# Patient Record
Sex: Female | Born: 1935 | Race: White | Hispanic: No | Smoking: Never smoker
Health system: Southern US, Community
[De-identification: ages and names within clinical notes are randomized; demographics above are authoritative.]

## PROBLEM LIST (undated history)

## (undated) DIAGNOSIS — G4700 Insomnia, unspecified: Secondary | ICD-10-CM

## (undated) DIAGNOSIS — Z960 Presence of urogenital implants: Secondary | ICD-10-CM

## (undated) DIAGNOSIS — G309 Alzheimer's disease, unspecified: Secondary | ICD-10-CM

## (undated) DIAGNOSIS — G20A1 Parkinson's disease without dyskinesia, without mention of fluctuations: Secondary | ICD-10-CM

## (undated) DIAGNOSIS — Z22322 Carrier or suspected carrier of Methicillin resistant Staphylococcus aureus: Secondary | ICD-10-CM

## (undated) DIAGNOSIS — G20 Parkinson's disease: Secondary | ICD-10-CM

## (undated) DIAGNOSIS — F32A Depression, unspecified: Secondary | ICD-10-CM

## (undated) DIAGNOSIS — Z978 Presence of other specified devices: Secondary | ICD-10-CM

## (undated) DIAGNOSIS — F329 Major depressive disorder, single episode, unspecified: Secondary | ICD-10-CM

## (undated) DIAGNOSIS — N390 Urinary tract infection, site not specified: Secondary | ICD-10-CM

## (undated) DIAGNOSIS — F0280 Dementia in other diseases classified elsewhere without behavioral disturbance: Secondary | ICD-10-CM

## (undated) DIAGNOSIS — E079 Disorder of thyroid, unspecified: Secondary | ICD-10-CM

## (undated) DIAGNOSIS — F1010 Alcohol abuse, uncomplicated: Secondary | ICD-10-CM

## (undated) DIAGNOSIS — M1990 Unspecified osteoarthritis, unspecified site: Secondary | ICD-10-CM

## (undated) DIAGNOSIS — G459 Transient cerebral ischemic attack, unspecified: Secondary | ICD-10-CM

## (undated) DIAGNOSIS — I639 Cerebral infarction, unspecified: Secondary | ICD-10-CM

## (undated) HISTORY — PX: ABDOMINAL HYSTERECTOMY: SHX81

## (undated) HISTORY — PX: THYROID SURGERY: SHX805

## (undated) HISTORY — DX: Disorder of thyroid, unspecified: E07.9

## (undated) HISTORY — DX: Alcohol abuse, uncomplicated: F10.10

## (undated) HISTORY — DX: Major depressive disorder, single episode, unspecified: F32.9

## (undated) HISTORY — DX: Depression, unspecified: F32.A

## (undated) HISTORY — DX: Unspecified osteoarthritis, unspecified site: M19.90

## (undated) HISTORY — DX: Cerebral infarction, unspecified: I63.9

## (undated) HISTORY — PX: TONSILLECTOMY AND ADENOIDECTOMY: SUR1326

---

## 2003-11-23 ENCOUNTER — Inpatient Hospital Stay (HOSPITAL_COMMUNITY): Admission: EM | Admit: 2003-11-23 | Discharge: 2003-11-28 | Payer: Self-pay

## 2003-11-26 ENCOUNTER — Encounter (INDEPENDENT_AMBULATORY_CARE_PROVIDER_SITE_OTHER): Payer: Self-pay | Admitting: Specialist

## 2003-11-30 ENCOUNTER — Encounter: Admission: RE | Admit: 2003-11-30 | Discharge: 2003-11-30 | Payer: Self-pay | Admitting: Family Medicine

## 2004-01-18 ENCOUNTER — Encounter: Admission: RE | Admit: 2004-01-18 | Discharge: 2004-01-18 | Payer: Self-pay | Admitting: Family Medicine

## 2006-11-19 HISTORY — PX: CHOLECYSTECTOMY: SHX55

## 2009-08-19 ENCOUNTER — Inpatient Hospital Stay (HOSPITAL_COMMUNITY): Admission: EM | Admit: 2009-08-19 | Discharge: 2009-08-20 | Payer: Self-pay | Admitting: Emergency Medicine

## 2009-08-21 ENCOUNTER — Encounter (INDEPENDENT_AMBULATORY_CARE_PROVIDER_SITE_OTHER): Payer: Self-pay | Admitting: Internal Medicine

## 2010-02-25 LAB — HM PAP SMEAR

## 2010-02-25 LAB — HM MAMMOGRAPHY

## 2011-02-14 ENCOUNTER — Emergency Department (HOSPITAL_COMMUNITY): Payer: No Typology Code available for payment source

## 2011-02-14 ENCOUNTER — Observation Stay (HOSPITAL_COMMUNITY)
Admission: EM | Admit: 2011-02-14 | Discharge: 2011-02-15 | Disposition: A | Discharge status: Home / Self Care | Payer: No Typology Code available for payment source | Attending: Family Medicine | Admitting: Family Medicine

## 2011-02-14 DIAGNOSIS — N390 Urinary tract infection, site not specified: Secondary | ICD-10-CM | POA: Insufficient documentation

## 2011-02-14 DIAGNOSIS — F329 Major depressive disorder, single episode, unspecified: Secondary | ICD-10-CM | POA: Insufficient documentation

## 2011-02-14 DIAGNOSIS — G459 Transient cerebral ischemic attack, unspecified: Principal | ICD-10-CM | POA: Insufficient documentation

## 2011-02-14 DIAGNOSIS — F1010 Alcohol abuse, uncomplicated: Secondary | ICD-10-CM | POA: Insufficient documentation

## 2011-02-14 DIAGNOSIS — F3289 Other specified depressive episodes: Secondary | ICD-10-CM | POA: Insufficient documentation

## 2011-02-14 DIAGNOSIS — E039 Hypothyroidism, unspecified: Secondary | ICD-10-CM | POA: Insufficient documentation

## 2011-02-14 DIAGNOSIS — E785 Hyperlipidemia, unspecified: Secondary | ICD-10-CM | POA: Insufficient documentation

## 2011-02-14 DIAGNOSIS — Z9181 History of falling: Secondary | ICD-10-CM | POA: Insufficient documentation

## 2011-02-14 DIAGNOSIS — I10 Essential (primary) hypertension: Secondary | ICD-10-CM | POA: Insufficient documentation

## 2011-02-14 LAB — CK TOTAL AND CKMB (NOT AT ARMC)
CK, MB: 3 ng/mL (ref 0.3–4.0)
CK, MB: 2.6 ng/mL (ref 0.3–4.0)
Relative Index: 2.9 — ABNORMAL HIGH (ref 0.0–2.5)
Relative Index: 2.5 (ref 0.0–2.5)
Total CK: 105 U/L (ref 7–177)

## 2011-02-14 LAB — COMPREHENSIVE METABOLIC PANEL
Albumin: 3.5 g/dL (ref 3.5–5.2)
Alkaline Phosphatase: 79 U/L (ref 39–117)
BUN: 14 mg/dL (ref 6–23)
Calcium: 9.3 mg/dL (ref 8.4–10.5)
Creatinine, Ser: 0.91 mg/dL (ref 0.4–1.2)
Potassium: 3.9 mEq/L (ref 3.5–5.1)
Total Protein: 6.6 g/dL (ref 6.0–8.3)

## 2011-02-14 LAB — RAPID URINE DRUG SCREEN, HOSP PERFORMED
Barbiturates: NOT DETECTED
Cocaine: NOT DETECTED
Opiates: NOT DETECTED
Tetrahydrocannabinol: NOT DETECTED

## 2011-02-14 LAB — DIFFERENTIAL
Basophils Relative: 1 % (ref 0–1)
Lymphs Abs: 2.1 10*3/uL (ref 0.7–4.0)
Monocytes Relative: 10 % (ref 3–12)
Neutro Abs: 4.5 10*3/uL (ref 1.7–7.7)
Neutrophils Relative %: 59 % (ref 43–77)

## 2011-02-14 LAB — URINALYSIS, ROUTINE W REFLEX MICROSCOPIC
Bilirubin Urine: NEGATIVE
Ketones, ur: NEGATIVE mg/dL
Nitrite: NEGATIVE
pH: 7 (ref 5.0–8.0)

## 2011-02-14 LAB — POCT I-STAT, CHEM 8
Creatinine, Ser: 1.1 mg/dL (ref 0.4–1.2)
Hemoglobin: 13.6 g/dL (ref 12.0–15.0)
Potassium: 3.9 mEq/L (ref 3.5–5.1)
Sodium: 137 mEq/L (ref 135–145)

## 2011-02-14 LAB — ETHANOL: Alcohol, Ethyl (B): <5 mg/dL (ref 0–10)

## 2011-02-14 LAB — APTT: aPTT: 27 seconds (ref 24–37)

## 2011-02-14 LAB — TROPONIN I: Troponin I: 0.01 ng/mL (ref 0.00–0.06)

## 2011-02-14 LAB — URINE MICROSCOPIC-ADD ON

## 2011-02-14 LAB — CBC
Hemoglobin: 13.4 g/dL (ref 12.0–15.0)
MCH: 32.1 pg (ref 26.0–34.0)
MCV: 94 fL (ref 78.0–100.0)
RBC: 4.18 MIL/uL (ref 3.87–5.11)

## 2011-02-14 LAB — PROTIME-INR: Prothrombin Time: 13.3 seconds (ref 11.6–15.2)

## 2011-02-14 MED ORDER — IOHEXOL 350 MG/ML SOLN: 50.0000 mL | Freq: Once | INTRAVENOUS | Status: AC | PRN

## 2011-02-15 ENCOUNTER — Observation Stay (HOSPITAL_COMMUNITY): Payer: No Typology Code available for payment source

## 2011-02-15 ENCOUNTER — Observation Stay (HOSPITAL_COMMUNITY): Payer: Medicare (Managed Care)

## 2011-02-15 ENCOUNTER — Other Ambulatory Visit: Payer: Self-pay | Admitting: Family Medicine

## 2011-02-15 DIAGNOSIS — F1010 Alcohol abuse, uncomplicated: Secondary | ICD-10-CM

## 2011-02-15 DIAGNOSIS — F0390 Unspecified dementia without behavioral disturbance: Secondary | ICD-10-CM

## 2011-02-15 DIAGNOSIS — G459 Transient cerebral ischemic attack, unspecified: Secondary | ICD-10-CM

## 2011-02-15 LAB — BASIC METABOLIC PANEL
BUN: 13 mg/dL (ref 6–23)
CO2: 28 mEq/L (ref 19–32)
Calcium: 9 mg/dL (ref 8.4–10.5)
Chloride: 100 mEq/L (ref 96–112)
Creatinine, Ser: 0.67 mg/dL (ref 0.4–1.2)
GFR calc Af Amer: >60 mL/min (ref >60)
GFR calc non Af Amer: >60 mL/min (ref >60)
Glucose, Bld: 109 mg/dL — ABNORMAL HIGH (ref 70–99)
Potassium: 3.7 mEq/L (ref 3.5–5.1)
Sodium: 138 mEq/L (ref 135–145)

## 2011-02-15 LAB — CARDIAC PANEL(CRET KIN+CKTOT+MB+TROPI)
CK, MB: 1.6 ng/mL (ref 0.3–4.0)
CK, MB: 1.6 ng/mL (ref 0.3–4.0)
Relative Index: INVALID (ref 0.0–2.5)
Relative Index: INVALID (ref 0.0–2.5)
Total CK: 68 U/L (ref 7–177)
Troponin I: 0.02 ng/mL (ref 0.00–0.06)
Troponin I: 0.01 ng/mL (ref 0.00–0.06)

## 2011-02-15 LAB — CBC
HCT: 37.5 % (ref 36.0–46.0)
MCH: 32.2 pg (ref 26.0–34.0)
MCHC: 34.4 g/dL (ref 30.0–36.0)
MCV: 93.5 fL (ref 78.0–100.0)
RDW: 12.3 % (ref 11.5–15.5)

## 2011-02-15 LAB — TSH: TSH: 1.939 u[IU]/mL (ref 0.350–4.500)

## 2011-02-16 LAB — URINE CULTURE
Colony Count: >=100000
Culture  Setup Time: 201203282310

## 2011-02-17 ENCOUNTER — Emergency Department (HOSPITAL_COMMUNITY): Payer: Medicare (Managed Care)

## 2011-02-17 ENCOUNTER — Observation Stay (HOSPITAL_COMMUNITY)
Admission: EM | Admit: 2011-02-17 | Discharge: 2011-02-18 | Disposition: A | Discharge status: Home / Self Care | Payer: No Typology Code available for payment source | Attending: Family Medicine | Admitting: Family Medicine

## 2011-02-17 DIAGNOSIS — R4182 Altered mental status, unspecified: Secondary | ICD-10-CM

## 2011-02-17 DIAGNOSIS — I10 Essential (primary) hypertension: Secondary | ICD-10-CM | POA: Insufficient documentation

## 2011-02-17 DIAGNOSIS — E039 Hypothyroidism, unspecified: Secondary | ICD-10-CM | POA: Insufficient documentation

## 2011-02-17 DIAGNOSIS — R0602 Shortness of breath: Secondary | ICD-10-CM | POA: Insufficient documentation

## 2011-02-17 DIAGNOSIS — F3289 Other specified depressive episodes: Secondary | ICD-10-CM | POA: Insufficient documentation

## 2011-02-17 DIAGNOSIS — F1010 Alcohol abuse, uncomplicated: Secondary | ICD-10-CM | POA: Insufficient documentation

## 2011-02-17 DIAGNOSIS — G459 Transient cerebral ischemic attack, unspecified: Principal | ICD-10-CM | POA: Insufficient documentation

## 2011-02-17 DIAGNOSIS — E785 Hyperlipidemia, unspecified: Secondary | ICD-10-CM | POA: Insufficient documentation

## 2011-02-17 DIAGNOSIS — R51 Headache: Secondary | ICD-10-CM | POA: Insufficient documentation

## 2011-02-17 DIAGNOSIS — F329 Major depressive disorder, single episode, unspecified: Secondary | ICD-10-CM | POA: Insufficient documentation

## 2011-02-17 DIAGNOSIS — R5381 Other malaise: Secondary | ICD-10-CM | POA: Insufficient documentation

## 2011-02-17 DIAGNOSIS — I517 Cardiomegaly: Secondary | ICD-10-CM | POA: Insufficient documentation

## 2011-02-17 DIAGNOSIS — N390 Urinary tract infection, site not specified: Secondary | ICD-10-CM | POA: Insufficient documentation

## 2011-02-17 LAB — URINALYSIS, ROUTINE W REFLEX MICROSCOPIC
Bilirubin Urine: NEGATIVE
Glucose, UA: NEGATIVE mg/dL
Specific Gravity, Urine: 1.012 (ref 1.005–1.030)
Urobilinogen, UA: 0.2 mg/dL (ref 0.0–1.0)
pH: 6 (ref 5.0–8.0)

## 2011-02-17 LAB — DIFFERENTIAL
Basophils Absolute: 0 10*3/uL (ref 0.0–0.1)
Basophils Relative: 1 % (ref 0–1)
Eosinophils Absolute: 0.3 10*3/uL (ref 0.0–0.7)
Eosinophils Relative: 3 % (ref 0–5)
Eosinophils Relative: 4 % (ref 0–5)
Lymphocytes Relative: 20 % (ref 12–46)
Lymphs Abs: 1.8 10*3/uL (ref 0.7–4.0)
Monocytes Absolute: 0.8 10*3/uL (ref 0.1–1.0)
Monocytes Relative: 8 % (ref 3–12)
Neutro Abs: 4.8 10*3/uL (ref 1.7–7.7)
Neutrophils Relative %: 69 % (ref 43–77)

## 2011-02-17 LAB — COMPREHENSIVE METABOLIC PANEL
ALT: 17 U/L (ref 0–35)
AST: 22 U/L (ref 0–37)
Albumin: 3.5 g/dL (ref 3.5–5.2)
BUN: 11 mg/dL (ref 6–23)
CO2: 26 mEq/L (ref 19–32)
CO2: 27 mEq/L (ref 19–32)
Calcium: 8.4 mg/dL (ref 8.4–10.5)
Calcium: 8.6 mg/dL (ref 8.4–10.5)
Creatinine, Ser: 0.8 mg/dL (ref 0.4–1.2)
Creatinine, Ser: 0.63 mg/dL (ref 0.4–1.2)
GFR calc Af Amer: >60 mL/min (ref >60)
GFR calc non Af Amer: >60 mL/min (ref >60)
Glucose, Bld: 98 mg/dL (ref 70–99)
Sodium: 134 mEq/L — ABNORMAL LOW (ref 135–145)
Total Protein: 6.4 g/dL (ref 6.0–8.3)
Total Protein: 6.3 g/dL (ref 6.0–8.3)

## 2011-02-17 LAB — CBC
HCT: 37.6 % (ref 36.0–46.0)
MCH: 32.8 pg (ref 26.0–34.0)
MCHC: 34.8 g/dL (ref 30.0–36.0)
MCV: 94.2 fL (ref 78.0–100.0)
Platelets: 246 10*3/uL (ref 150–400)
RBC: 3.99 MIL/uL (ref 3.87–5.11)
RDW: 12.2 % (ref 11.5–15.5)
WBC: 7.9 10*3/uL (ref 4.0–10.5)

## 2011-02-17 LAB — PROTIME-INR
INR: 1.01 (ref 0.00–1.49)
Prothrombin Time: 13.5 seconds (ref 11.6–15.2)

## 2011-02-17 LAB — CK TOTAL AND CKMB (NOT AT ARMC)
CK, MB: 1.6 ng/mL (ref 0.3–4.0)
Relative Index: INVALID (ref 0.0–2.5)
Total CK: 53 U/L (ref 7–177)

## 2011-02-17 LAB — URINE MICROSCOPIC-ADD ON

## 2011-02-17 LAB — ETHANOL: Alcohol, Ethyl (B): <5 mg/dL (ref 0–10)

## 2011-02-17 LAB — APTT
aPTT: 27 seconds (ref 24–37)
aPTT: 29 seconds (ref 24–37)

## 2011-02-18 LAB — COMPREHENSIVE METABOLIC PANEL
ALT: 15 U/L (ref 0–35)
AST: 18 U/L (ref 0–37)
Albumin: 3.2 g/dL — ABNORMAL LOW (ref 3.5–5.2)
Calcium: 8.6 mg/dL (ref 8.4–10.5)
GFR calc Af Amer: >60 mL/min (ref >60)
Glucose, Bld: 101 mg/dL — ABNORMAL HIGH (ref 70–99)
Sodium: 140 mEq/L (ref 135–145)
Total Protein: 5.9 g/dL — ABNORMAL LOW (ref 6.0–8.3)

## 2011-02-18 LAB — AMMONIA: Ammonia: 26 umol/L (ref 11–35)

## 2011-02-18 LAB — URINE CULTURE: Culture  Setup Time: 201204010013

## 2011-02-18 LAB — ETHANOL: Alcohol, Ethyl (B): <5 mg/dL (ref 0–10)

## 2011-02-19 ENCOUNTER — Ambulatory Visit: Payer: Self-pay | Admitting: Internal Medicine

## 2011-02-19 ENCOUNTER — Telehealth: Payer: Self-pay | Admitting: *Deleted

## 2011-02-19 NOTE — Telephone Encounter (Signed)
OK to give verbal order for these items.

## 2011-02-19 NOTE — Telephone Encounter (Signed)
[facility] Annabelle Harman [phone], ext [phone] called re: Suzie Portela.  Dana requesting verbal order for  1.) Straight cane 2.) transfer bench 3.) 3 in one commode

## 2011-02-20 NOTE — Telephone Encounter (Signed)
Verbal order for 3 requested items on Dana's personally identified VM at [facility]

## 2011-02-22 LAB — URINALYSIS, ROUTINE W REFLEX MICROSCOPIC
Bilirubin Urine: NEGATIVE
Protein, ur: NEGATIVE mg/dL
Urobilinogen, UA: 0.2 mg/dL (ref 0.0–1.0)

## 2011-02-22 LAB — CBC
HCT: 40.6 % (ref 36.0–46.0)
Hemoglobin: 15 g/dL (ref 12.0–15.0)
MCV: 101 fL — ABNORMAL HIGH (ref 78.0–100.0)
Platelets: 221 10*3/uL (ref 150–400)
RBC: 4.02 MIL/uL (ref 3.87–5.11)
RBC: 4.32 MIL/uL (ref 3.87–5.11)
WBC: 9 10*3/uL (ref 4.0–10.5)

## 2011-02-22 LAB — COMPREHENSIVE METABOLIC PANEL
ALT: 16 U/L (ref 0–35)
AST: 21 U/L (ref 0–37)
Albumin: 3.2 g/dL — ABNORMAL LOW (ref 3.5–5.2)
CO2: 26 mEq/L (ref 19–32)
Calcium: 8.3 mg/dL — ABNORMAL LOW (ref 8.4–10.5)
Creatinine, Ser: 0.59 mg/dL (ref 0.4–1.2)
GFR calc Af Amer: >60 mL/min (ref >60)
GFR calc non Af Amer: >60 mL/min (ref >60)
Sodium: 144 mEq/L (ref 135–145)

## 2011-02-22 LAB — CK TOTAL AND CKMB (NOT AT ARMC)
CK, MB: 2 ng/mL (ref 0.3–4.0)
Relative Index: INVALID (ref 0.0–2.5)
Relative Index: INVALID (ref 0.0–2.5)

## 2011-02-22 LAB — RETICULOCYTES
RBC.: 4.26 MIL/uL (ref 3.87–5.11)
Retic Ct Pct: 2.1 % (ref 0.4–3.1)

## 2011-02-22 LAB — FOLATE: Folate: 18.5 ng/mL

## 2011-02-22 LAB — CULTURE, BLOOD (ROUTINE X 2): Culture: NO GROWTH

## 2011-02-22 LAB — POCT CARDIAC MARKERS: Myoglobin, poc: 96.6 ng/mL (ref 12–200)

## 2011-02-22 LAB — BASIC METABOLIC PANEL
BUN: 9 mg/dL (ref 6–23)
CO2: 25 mEq/L (ref 19–32)
Calcium: 8.7 mg/dL (ref 8.4–10.5)
Chloride: 105 mEq/L (ref 96–112)
Creatinine, Ser: 0.61 mg/dL (ref 0.4–1.2)
GFR calc Af Amer: >60 mL/min (ref >60)
GFR calc non Af Amer: >60 mL/min (ref >60)
Glucose, Bld: 98 mg/dL (ref 70–99)
Potassium: 3.5 mEq/L (ref 3.5–5.1)
Sodium: 139 mEq/L (ref 135–145)

## 2011-02-22 LAB — LIPID PANEL
HDL: 58 mg/dL (ref >39)
Total CHOL/HDL Ratio: 3.1 RATIO
Triglycerides: 101 mg/dL (ref <150)

## 2011-02-22 LAB — RAPID URINE DRUG SCREEN, HOSP PERFORMED
Amphetamines: NOT DETECTED
Barbiturates: NOT DETECTED
Benzodiazepines: NOT DETECTED

## 2011-02-22 LAB — CARDIAC PANEL(CRET KIN+CKTOT+MB+TROPI)
CK, MB: 1.2 ng/mL (ref 0.3–4.0)
Troponin I: 0.02 ng/mL (ref 0.00–0.06)
Troponin I: 0.02 ng/mL (ref 0.00–0.06)

## 2011-02-22 LAB — TROPONIN I: Troponin I: 0.01 ng/mL (ref 0.00–0.06)

## 2011-02-22 LAB — URINE MICROSCOPIC-ADD ON

## 2011-02-22 LAB — DIFFERENTIAL
Basophils Absolute: 0 10*3/uL (ref 0.0–0.1)
Lymphocytes Relative: 30 % (ref 12–46)
Monocytes Absolute: 0.6 10*3/uL (ref 0.1–1.0)
Monocytes Relative: 12 % (ref 3–12)
Neutro Abs: 2.8 10*3/uL (ref 1.7–7.7)

## 2011-02-22 LAB — GLUCOSE, CAPILLARY: Glucose-Capillary: 116 mg/dL — ABNORMAL HIGH (ref 70–99)

## 2011-02-22 LAB — TSH: TSH: 0.292 u[IU]/mL — ABNORMAL LOW (ref 0.350–4.500)

## 2011-02-22 LAB — IRON AND TIBC
Saturation Ratios: 10 % — ABNORMAL LOW (ref 20–55)
UIBC: 283 ug/dL

## 2011-02-22 LAB — FERRITIN: Ferritin: 72 ng/mL (ref 10–291)

## 2011-02-22 LAB — VITAMIN B12: Vitamin B-12: 796 pg/mL (ref 211–911)

## 2011-02-26 ENCOUNTER — Ambulatory Visit (INDEPENDENT_AMBULATORY_CARE_PROVIDER_SITE_OTHER): Payer: No Typology Code available for payment source | Admitting: Family Medicine

## 2011-02-26 ENCOUNTER — Encounter: Payer: Self-pay | Admitting: Family Medicine

## 2011-02-26 VITALS — BP 142/70 | HR 80 | Temp 98.5°F | Resp 12 | Ht 60.0 in | Wt 130.0 lb

## 2011-02-26 DIAGNOSIS — N390 Urinary tract infection, site not specified: Secondary | ICD-10-CM

## 2011-02-26 DIAGNOSIS — E039 Hypothyroidism, unspecified: Secondary | ICD-10-CM | POA: Insufficient documentation

## 2011-02-26 DIAGNOSIS — F1010 Alcohol abuse, uncomplicated: Secondary | ICD-10-CM

## 2011-02-26 DIAGNOSIS — G459 Transient cerebral ischemic attack, unspecified: Secondary | ICD-10-CM

## 2011-02-26 DIAGNOSIS — E785 Hyperlipidemia, unspecified: Secondary | ICD-10-CM | POA: Insufficient documentation

## 2011-02-26 DIAGNOSIS — F329 Major depressive disorder, single episode, unspecified: Secondary | ICD-10-CM

## 2011-02-26 NOTE — Patient Instructions (Signed)
.Low-Fat, Low-Saturated-Fat, Low-Cholesterol Diets Food Selection Guide BREADS, CEREALS, PASTA, RICE, DRIED PEAS, AND BEANS These products are high in carbohydrates and most are low in fat. Therefore, they can be increased in the diet as substitutes for fatty foods. They too, however, contain calories and should not be eaten in excess. Cereals can be eaten for snacks as well as for breakfast.  Include foods that contain fiber (fruits, vegetables, whole grains, and legumes). Research shows that fiber may lower blood cholesterol levels, especially the water-soluble fiber found in fruits, vegetables, oat products, and legumes. FRUITS AND VEGETABLES It is good to eat fruits and vegetables. Besides being sources of fiber, both are rich in vitamins and some minerals. They help you get the daily allowances of these nutrients. Fruits and vegetables can be used for snacks and desserts. MEATS Limit lean meat, chicken, turkey, and fish to no more than 6 ounces per day. Beef, Pork, and Lamb  Use lean cuts of beef, pork, and lamb. Lean cuts include:   Extra-lean ground beef.   Arm roast.   Sirloin tip.   Center-cut ham.   Round steak.   Loin chops.   Rump roast.   Tenderloin.  Trim all fat off the outside of meats before cooking. It is not necessary to severely decrease the intake of red meat, but lean choices should be made. Lean meat is rich in protein and contains a highly absorbable form of iron. Premenopausal women, in particular, should avoid reducing lean red meat because this could increase the risk for low red blood cells (iron-deficiency anemia). Processed Meats Processed meats, such as bacon, bologna, salami, sausage, and hot dogs contain large quantities of fat, are not rich in valuable nutrients, and should not be eaten very often. Organ Meats The organ meats, such as liver, sweetbreads, kidneys, and brain are very rich in cholesterol. They should be limited. Chicken and  Turkey These are good sources of protein. The fat of poultry can be reduced by removing the skin and underlying fat layers before cooking. Chicken and turkey can be substituted for lean red meat in the diet. Poultry should not be fried or covered with high-fat sauces. Fish and Shellfish Fish is a good source of protein. Shellfish contain cholesterol, but they usually are low in saturated fatty acids. The preparation of fish is important. Like chicken and turkey, they should not be fried or covered with high-fat sauces. EGGS Egg yolks often are hidden in cooked and processed foods. Egg whites contain no fat or cholesterol. They can be eaten often. Try 1 to 2 egg whites instead of whole eggs in recipes or use egg substitutes that do not contain yolk. MILK AND DAIRY PRODUCTS Use skim or 1% milk instead of 2% or whole milk. Decrease whole milk, natural, and processed cheeses. Use nonfat or low-fat (2%) cottage cheese or low-fat cheeses made from vegetable oils. Choose nonfat or low-fat (1 to 2%) yogurt. Experiment with evaporated skim milk in recipes that call for heavy cream. Substitute low-fat yogurt or low-fat cottage cheese for sour cream in dips and salad dressings. Have at least 2 servings of low-fat dairy products, such as 2 glasses of skim (or 1%) milk each day to help get your daily calcium intake. FATS AND OILS Reduce the total intake of fats, especially saturated fat. Butterfat, lard, and beef fats are high in saturated fat and cholesterol. These should be avoided as much as possible. Vegetable fats do not contain cholesterol, but certain vegetable fats, such as   coconut oil, palm oil, and palm kernel oil are very high in saturated fats. These should be limited. These fats are often used in bakery goods, processed foods, popcorn, oils, and nondairy creamers. Vegetable shortenings and some peanut butters contain hydrogenated oils, which are also saturated fats. Read the labels on these foods and check  for saturated vegetable oils. Unsaturated vegetable oils and fats do not raise blood cholesterol. However, they should be limited because they are fats and are high in calories. Total fat should still be limited to 30% of your daily caloric intake. Desirable liquid vegetable oils are corn oil, cottonseed oil, olive oil, canola oil, safflower oil, soybean oil, and sunflower oil. Peanut oil is not as good, but small amounts are acceptable. Buy a heart-healthy tub margarine that has no partially hydrogenated oils in the ingredients. Mayonnaise and salad dressings often are made from unsaturated fats, but they should also be limited because of their high calorie and fat content. Seeds, nuts, peanut butter, olives, and avocados are high in fat, but the fat is mainly the unsaturated type. These foods should be limited mainly to avoid excess calories and fat. OTHER EATING TIPS Snacks  Most sweets should be limited as snacks. They tend to be rich in calories and fats, and their caloric content outweighs their nutritional value. Some good choices in snacks are graham crackers, melba toast, soda crackers, bagels (no egg), English muffins, fruits, and vegetables. These snacks are preferable to snack crackers, French fries, and chips. Popcorn should be air-popped or cooked in small amounts of liquid vegetable oil. Desserts Eat fruit, low-fat yogurt, and fruit ices instead of pastries, cake, and cookies. Sherbet, angel food cake, gelatin dessert, frozen low-fat yogurt, or other frozen products that do not contain saturated fat (pure fruit juice bars, frozen ice pops) are also acceptable.  COOKING METHODS Choose those methods that use little or no fat. They include:  Poaching.   Braising.   Steaming.   Grilling.   Baking.   Stir-frying.   Broiling.   Microwaving.  Foods can be cooked in a nonstick pan without added fat, or use a nonfat cooking spray in regular cookware. Limit fried foods and avoid frying  in saturated fat. Add moisture to lean meats by using water, broth, cooking wines, and other nonfat or low-fat sauces along with the cooking methods mentioned above. Soups and stews should be chilled after cooking. The fat that forms on top after a few hours in the refrigerator should be skimmed off. When preparing meals, avoid using excess salt. Salt can contribute to raising blood pressure in some people. EATING AWAY FROM HOME Order entres, potatoes, and vegetables without sauces or butter. When meat exceeds the size of a deck of cards (3 to 4 ounces), the rest can be taken home for another meal. Choose vegetable or fruit salads and ask for low-calorie salad dressings to be served on the side. Use dressings sparingly. Limit high-fat toppings, such as bacon, crumbled eggs, cheese, sunflower seeds, and olives. Ask for heart-healthy tub margarine instead of butter. Document Released: 04/27/2002 Document Re-Released: 01/30/2010 [employer] Patient Information 2011 [employer]. 

## 2011-02-26 NOTE — Progress Notes (Signed)
Subjective:    Patient ID: [name], female    DOB: 11-Mar-1931, 77 y.o.   MRN: [id number]  HPI Pleasant 77 year old female seen to establish care. Past medical history reviewed. She had recent hospitalization for what sounds like TIAs. History is that approximately 2 weeks ago she had episode of some right facial droop and right upper and lower extremity weakness with slurred speech. Symptoms were transient lasting hours. Then a couple days later she had according to daughter very similar symptoms with this time involving left facial weakness again with slurred speech and transient extremity weakness. She was admitted and MRI showed no evidence for acute CVA. Echocardiogram normal LV function.  CT angio neck 54 % LICA calcified plaque. Patient started on Plavix. No recurrent neurologic symptoms since discharge.  Patient had concern for some depressive issues. Widowed since October of this year. Just moved here to stay with daughter. No suicidal ideation. Started sertraline 25 mg daily in hospital. Patient states she feels off balance and wonders if medication is related. Prior history of alcohol abuse but none now several weeks. Currently not using any alcohol.  Reportedly had UTI in the hospital but urine culture revealed mixed colonies. Currently on Keflex which she has taken for over one week now. No current urinary symptoms.  Patient also has history of hypothyroidism and compliant with medication. Recent TSH normal. Also has some osteoarthritis especially involving left knee. She is allergic to sulfa and Celexa.  Family history as recorded elsewhere. Widowed since October of 2011. No history of smoking.   Review of Systems  Constitutional: Positive for fatigue. Negative for fever, chills, activity change, appetite change and unexpected weight change.  HENT: Negative for ear pain, congestion, sore throat, trouble swallowing, neck pain, voice change and tinnitus.   Respiratory: Negative for  cough, shortness of breath and wheezing.   Cardiovascular: Negative for chest pain, palpitations and leg swelling.  Gastrointestinal: Negative for nausea, vomiting, abdominal pain, diarrhea, constipation and blood in stool.  Genitourinary: Negative for dysuria and hematuria.  Musculoskeletal: Positive for back pain and arthralgias.  Skin: Negative for rash.  Neurological: Positive for dizziness. Negative for seizures, syncope and headaches.  Hematological: Negative for adenopathy. Does not bruise/bleed easily.  Psychiatric/Behavioral: Positive for dysphoric mood. Negative for suicidal ideas, hallucinations, confusion, self-injury and agitation.       Objective:   Physical Exam  Constitutional: She is oriented to person, place, and time. She appears well-developed and well-nourished. No distress.  HENT:  Head: Normocephalic and atraumatic.  Right Ear: External ear normal.  Left Ear: External ear normal.  Nose: Nose normal.  Mouth/Throat: Oropharynx is clear and moist. No oropharyngeal exudate.  Eyes: Right eye exhibits no discharge. Left eye exhibits no discharge.  Neck: Neck supple. No thyromegaly present.  Cardiovascular: Normal rate and regular rhythm.  Exam reveals no gallop.   Pulmonary/Chest: Effort normal and breath sounds normal. No respiratory distress. She has no wheezes. She has no rales.  Musculoskeletal: She exhibits no edema.  Lymphadenopathy:    She has no cervical adenopathy.  Neurological: She is alert and oriented to person, place, and time. No cranial nerve deficit.       Gait is normal and unassisted. No focal strength deficits  Skin: No rash noted.  Psychiatric: She has a normal mood and affect. Her behavior is normal.          Assessment & Plan:  #1 cerebrovascular disease with recent TIAs reported. Patient currently stable on Plavix #2 hyperlipidemia  with recent initiation of simvastatin #3 probable adjustment disorder with depressed mood with recent  loss of husband.  On sertraline. #4 hypothyroidism with recent normal TSH #5 recent UTI symptomatically stable  Continue with recent prescribed medications. We'll need to check lipid and hepatic in approximately 4-6 weeks. Discontinue Keflex. Reassess depression and other issues in 2 weeks.  We also discussed preventive measures such as pneumovax and she has been reluctant in the past.

## 2011-03-06 NOTE — Discharge Summary (Signed)
NAMEHEATHERLY, [name]                ACCOUNT NO.:  [id number]  MEDICAL RECORD NO.:  [id number]           PATIENT TYPE:  O  LOCATION:  3028                         FACILITY:  [facility]  PHYSICIAN:  [name], M.D.DATE OF BIRTH:  1931-02-16  DATE OF ADMISSION:  02/14/2011 DATE OF DISCHARGE:  02/15/2011                              DISCHARGE SUMMARY   DISCHARGE DIAGNOSES: 1. Transient ischemic attack. 2. Altered mental status, resolved. 3. Hypertension. 4. Hypothyroidism. 5. History of alcohol abuse. 6. Urinary tract infection. 7. Depression. 8. Hyperlipidemia.  DISCHARGE MEDICATIONS: 1. Plavix 75 mg p.o. daily. 2. Folic acid 1 mg p.o. daily. 3. Multivitamin p.o. daily. 4. Pravastatin 40 mg p.o. daily. 5. Thiamine 100 mg p.o. daily. 6. Lorazepam 0.5 mg p.o. q.4 h p.r.n. 7. Synthroid 137 mcg p.o. daily. 8. Tylenol 650 mg p.o. q.4 h p.r.n. pain.  LABORATORY DATA AND PROCEDURES: 1. Hemoglobin A1c 6.0. 2. Cholesterol 195, triglyceride 87, HDL 60, and LDL 118. 3. TSH 1.93. 4. Urine culture growing greater than 100,000 colonies Klebsiella     pneumoniae. 5. CTA of head and neck showing prominent calcified plaque of proximal     left internal carotid artery with 54% stenosis and mild narrowing     of right carotid, small acute left frontotemporal lobe infarct and     calcification with mild narrowing of vertebral arteries     bilaterally. 6. A 2-D echocardiogram showing ejection fraction of 60-65% with     normal wall motion, grade 1 diastolic dysfunction, and mild aortic     valve and mitral valve regurgitation and no cardiac source of     embolism.  BRIEF HOSPITAL COURSE:  This is a 77 year old female with a history of TIA and alcohol use who presents with transient slurred speech, right facial droop, and altered mental status. 1. TIA.  The patient did have transient word-finding difficulty,     dysarthria, and right facial droop that had resolved within 3 hours  of presentation.  These symptoms were concerning for TIA given her     previous history of TIA several years prior.  CT scan did show a     likely small frontotemporal lesion with uncertain acuity.  Additional stroke workup     included echocardiogram which did not reveal cardiac source and     risk stratification labs including fasting lipid panel, TSH, and     A1c as listed above.  Additionally, her head and neck arteries were     evaluated with CTA and no significant stenoses were discovered.     The patient had previously been taking a daily aspirin and after     consideration of this was started on Plavix.  The patient had no     residual speech, motor, or sensory deficits at the time of     discharge.  She was noted to have a normal gait and no residual     facial paralysis or speech difficulty.  She was completely fluent     with both receptive and expressive speech.  She was started on     statin therapy with her  LDL of 118 as an untreated fasting level. 2. History of alcohol abuse.  The patient does endorse drinking up to     3 beers daily and was monitored on CIWA protocol without any     withdrawal symptoms during hospitalization.  The patient was     strongly encouraged to discontinue this as it will likely     contribute to her decline in health in years to come.  She was started on     multivitamin, folate, and thiamine during her hospitalization and     encouraged to continue this for alcohol-induced nutritional deficits.     Likely, this is also contributing to a component of depression     which the patient endorses. 3. Bacteriuria.  The patient denied any symptoms of UTI including     dysuria, abdominal pain, fevers, or malodorous urine.  In the     emergency department, she was noted to have pyuria and was treated     empirically with Rocephin per IV.  The patient's urine culture did     grow Klebsiella sensitive to Rocephin.  This will need to be     followed up if she  develops symptoms of UTI or fever. 4. Depression.  The patient did endorse some depressive symptoms and     had recently discontinued her Celexa due to medication side     effects.  Antidepressant therapy was not instituted in its place     given her concurrent alcohol abuse and interplay of this in her     depressive symptoms.  This will deferred to her PCP as     needed for long-term management and monitoring. 5. Hypothyroidism.  The patient's TSH was measured and found to be     within normal limits.  Therefore, her current levothyroxine dose     was continued.  DISCHARGE INSTRUCTIONS:  The patient was discharged to home with instructions to call her PCP or return to emergency care if she develops any one-sided weakness, slurring of speech, chest pain, shortness of breath, or any other concerning symptoms.  FOLLOWUP APPOINTMENTS:  Dr. Evelena Peat at [facility] on April 9 at 3:00 p.m.  DISCHARGE CONDITION:  The patient was discharged to home with her daughter in stable medical condition.    ______________________________ Dorothy Huger, MD   ______________________________ [name], M.D.    JK/MEDQ  D:  02/16/2011  T:  02/17/2011  Job:  [id number]  cc:   Evelena Peat, M.D.  Electronically Signed by Dorothy Huger MD on 02/21/2011 08:33:43 PM Electronically Signed by [name] M.D. on 03/06/2011 02:21:47 PM

## 2011-03-06 NOTE — Discharge Summary (Signed)
[name], [name]                ACCOUNT NO.:  [id number]  MEDICAL RECORD NO.:  [id number]           PATIENT TYPE:  E  LOCATION:  [facility]                         FACILITY:  [facility]  PHYSICIAN:  Pearlean Brownie, M.D.DATE OF BIRTH:  1931-01-15  DATE OF ADMISSION:  02/17/2011 DATE OF DISCHARGE:  02/18/2011                              DISCHARGE SUMMARY   PRIMARY CARE PROVIDER:  Evelena Peat, MD  DISCHARGE DIAGNOSES: 1. Transient left facial droop, resolved. 2. Altered mental status. 3. Anxiety/depression. 4. Urinary tract infection.  DISCHARGE MEDICATIONS: 1. Keflex 500 mg 1 tablet by mouth twice daily, take for 8 more days. 2. Plavix 75 mg 1 tablet by mouth daily. 3. Folic acid 1 mg 1 tablet by mouth daily. 4. Multivitamin with minerals 1 tablet by mouth daily. 5. Zoloft 25 mg 1 tablet by mouth daily. 6. Zocor 20 mg 1 tablet by mouth daily at bedtime. 7. Thiamine 100 mg 1 tablet by mouth daily. 8. Synthroid 137 mcg 1 tablet by mouth daily.  Stop taking the following medications:  Lorazepam 0.5 one tablet by mouth.  CONSULTS:  None at this time.  PROCEDURES:  On February 17, 2011, an MRI of head without contrast: 1. No acute infarct. 2. Moderate small vessel disease type changes. 3. Remote left caudate head infarct. 4. Polypoid opacification on the inferior aspect of right maxillary     sinus. 5. Cervical spondylotic changes with spinal stenosis at C3-4 level and     slight cord flattening.  PERTINENT LABORATORY DATA AT DISCHARGE:  Ammonia 26.  Ethyl alcohol less than 5.  Urine culture, 30,000 colonies with multiple bacterial morphotypes present.  CMET showed a sodium of 148, potassium 3.6, chloride 104, CO2 of 26, BUN 12, creatinine 0.53, and glucose 101. Urinalysis was positive for large leukocytes and small blood.  A previous urine culture from her recent hospitalization on February 14, 2011, had a urine culture that grew over 100,000 colonies of  Klebsiella pneumonia.  BRIEF HOSPITAL COURSE:  [name] is a 77 year old female with a history of alcohol abuse, depression, and anxiety who was recently discharged 2 days prior to this admission for transient ischemic attack. She presents now with recurrent transient facial droop, altered mental status, and urinary tract infection. 1. Transient facial droop:  This problem was observed by her daughter     who says it lasted for 20 minutes.  Notably, this is the opposite     side of her face at the last presentation of her symptoms 2 days     ago.  An MRI was negative for acute infarct and a recent transient     ischemic attack workup was negative including CTA and     echocardiogram.  The patient's new facial droop may be secondary to     a transient ischemic attack versus a history of complex migraine     versus anxiety disorder.  The patient was admitted and observed on     telemetry overnight.  The patient was recently discharged with     Plavix and statin and this was continued throughout her hospital  stay.  The patient was placed on fall precautions.  The patient     currently has physical therapy and home health established.  On     hospital day #2, the patient was somewhat confused, however, per     daughter the patient's confusion is intermittent.  The patient was     alert, awake, and oriented x3 with no focal neurological deficits.     We will discharge the patient on Plavix and statin and she is to     follow up with her new PCP outpatient. 2. Altered mental status:  This is likely multifactorial, could     possibly be secondary to her current urinary tract infection with a     baseline of dementia or pseudodementia with the patient's history     of depression and anxiety.  We will treat the urinary tract     infection on hospital day #1 and #2 with Rocephin 1 g IV.  We will     re-transition the patient to Keflex 500 mg by mouth twice daily for     8 more days to  complete a 10-day course. 3. Anxiety and depression:  The patient states that her husband passed     away 4 months ago.  She had been taking care of him for over 2     years and she still feels very anxious and depressed when dealing     with moving out of her home and into her daughter's house.  The     patient's episodes could potentially have been a panic attack with     her preceding dyspnea and bilateral hand numbness and feeling of     fullness in her throat.  We will start the patient on Zoloft 25 mg     p.o. daily.  We recommend that benzodiazepines are avoided in this     elderly patient with a history of alcohol abuse. 4. Urinary tract infection:  The patient was recently discharged 2     days ago and afterwards the patient's urine culture grew     Klebsiella.  The patient received Rocephin 1 g IV x2 days and will     receive Keflex 500 mg p.o. b.i.d. x8 more days.  The patient     remained afebrile throughout the hospital course with no     leukocytosis.  DISCHARGE INSTRUCTIONS:  Activity:  Increase activity slowly.  Diet:  Regular.  No restrictions.  Wound Care:  Not applicable.  The patient is to return to the hospital or call her PCP if she experiences any shortness of breath, chest pain associated with nausea and vomiting, altered mental status, numbness or tingling in her extremities, severe headache, or excessive weakness.  FOLLOWUP APPOINTMENTS:  The patient is to call and schedule a hospital followup in the next 1-2 weeks.  DISCHARGE CONDITION:  The patient was discharged home in stable medical condition.    ______________________________ Barnabas Lister, MD   ______________________________ Pearlean Brownie, M.D.    ID/MEDQ  D:  02/19/2011  T:  02/19/2011  Job:  [id number]  cc:   Evelena Peat, M.D.  Electronically Signed by Barnabas Lister MD on 03/01/2011 02:42:54 PM Electronically Signed by Pearlean Brownie M.D. on 03/06/2011 02:22:20 PM

## 2011-03-06 NOTE — H&P (Signed)
[name], [name]                ACCOUNT NO.:  [id number]  MEDICAL RECORD NO.:  [id number]           PATIENT TYPE:  E  LOCATION:  [facility]                         FACILITY:  [facility]  PHYSICIAN:  Pearlean Brownie, M.D.DATE OF BIRTH:  08-29-1931  DATE OF ADMISSION:  02/17/2011 DATE OF DISCHARGE:                             HISTORY & PHYSICAL   PRIMARY CARE PROVIDER:  Evelena Peat, MD  CHIEF COMPLAINT:  Altered mental status and left facial droop.  HISTORY OF PRESENT ILLNESS:  This is a 77 year old female with a history of TIA recently discharged on February 15, 2011, with similar symptoms who presents for recurrence of transient altered mental status and facial droop.  Her symptoms previously had involved the right side of her face and dysarthria, which resolved within a few hours.  After her recent discharge, the patient did well for 1 day, however, this morning she was up feeling anxious with some throat tightness.  Daughter gave her half a tablet of Celexa to treat this anxious feeling which had no immediate effects.  Later today as the patient was walking down some stairs she started again feeling anxious with chest tightness, shortness of breath and bilateral arm numbness after which time the daughter noticed some left facial droop.  These symptoms triggered her return to the emergency department. The patient and her daughter endorse current symptom resolution within 20 minutes.  The daughter also describes a recent increase in the patient's baseline confusion, however, memory problems did start over 1 year ago, and have worsened after her husband passed away several months ago.  The patient has stopped taking lorazepam that have been prescribed previously for anxiety due to concerns about side effects.  The patient also has a history of heavy alcohol abuse up until October 2011, been weaned down to 2-3 beers daily since she moved in with her daughter.  Notably, the patient had a  short stay at her individual home and was unattended and likely engaged in binge drinking at this time.  The patient then stopped drinking cold [place] approximately 10 days ago.  PAST MEDICAL HISTORY: 1. TIA. 2. Alcohol abuse. 3. Questionable dementia. 4. Hypertension. 5. Hyperlipidemia. 6. Hypothyroidism. 7. Type recurrent UTI. 8. Depression. 9. Anxiety.  ALLERGIES:  No known drug allergies.  MEDICATIONS: 1. Plavix 75 mg daily. 2. Pravastatin 40 mg daily. 3. Folic acid 1 mg p.o. daily. 4. Multivitamin. 5. Thiamine 100 mg p.o. daily 6. Synthroid 137 mcg p.o. daily. 7. Tylenol p.r.n.  SOCIAL HISTORY:  The patient lives with her daughter.  She denies tobacco or drug use.  The patient has a remote history of heavy alcohol use and 2-3 beers daily for several months but no alcohol in the last 10 days.  FAMILY HISTORY:  Limited due to dementia.  REVIEW OF SYSTEMS:  The patient denies fevers, chills, headache, palpitations, cough, nausea, abdominal pain, rash, visual changes.  She endorses dyspnea, dysuria, weakness and dizziness.  PHYSICAL EXAMINATION:  VITAL SIGNS:  Temperature 97.6, pulse 76, respirations 18, BP 130/70, O2 sat 96% on room air. GENERAL APPEARANCE:  No apparent distress. HEENT:  Normocephalic, atraumatic.  PERRLA.  EOMI.  Mucous membranes moist.  No perceived facial droop. NECK:  No lymphadenopathy and supple. CARDIOVASCULAR:  Regular rate and rhythm.  No murmurs. LUNGS:  Clear to auscultation bilaterally.  Nonlabored respirations. ABDOMEN:  Soft, nontender, nondistended.  Bowel sounds positive. GENITOURINARY:  Deferred. EXTREMITIES:  No lower extremity edema, 2+ dorsalis pedis pulses bilaterally and no skin breakdown. NEUROLOGIC:  Alert and oriented x3.  Cranial nerves II through XII intact.  5/5 bilateral upper extremity and lower extremity strength and symmetric patellar reflexes.  Normal finger-to-nose testing and coordination.  Negative Romberg  sign.  Gait is mildly unsteady and the patient is slow to rise.  Mini-mental status exam 19/30 with points taken off for concentration and attention tasks with 2/3 register and 0/3 recall.  Notably, the patient is easy to give up effort in answering questions and states "I am not in a good mood for this.".  LABS AND STUDIES: 1. Urinalysis specific gravity 1.012, small blood, large leukocyte     esterase, 21-50 white blood cells, few squamous, 0-2 RBCs. 2. White blood count 8.8, hemoglobin 13.3, platelets 246, INR 0.98.     Sodium 134, potassium 3.7, chloride 101, bicarb 26, BUN 12,     creatinine 0.80, glucose 26, AST 22, ALT 17, total protein 6.4,     troponin I 0.01, CK-MB 1.6.  IMAGING: 1. MRI brain shows no acute infarct, small regions of blood breakdown     products in left frontal lobe, probably related to remote     hemorrhagic ischemia or possibly remote trauma and moderate small     vessel disease changes with remote left caudate head infarct.  ASSESSMENT/PLAN:  This is a 77 year old female with a history of alcohol abuse, depression and anxiety who was recently discharged 2 days ago for transient ischemic attack who again presents with recurrent transient facial droop, altered mental status and urinary tract infection. 1. Transient facial droop.  This problem was observed by her daughter     today lasting for 20 minutes.  Notably, this is the opposite side     of her face as the last presentation of her symptoms 2 days ago.     MRI is negative for acute infarct and recent transient ischemic     attack workup was negative including CTA and echocardiogram.  This     is possibly transient ischemic attack versus atypical or complex     migraines versus much less likely seizure disorder.  Given her     recent transient ischemic attack and increased risk for cerebral     infarction, we will admit and observe on telemetry overnight     acutely.  The patient had not yet started her  medical management     including Plavix and statin so we will begin this medical therapy.  Will     repeat EKG but not undergo a full TIA workup as     this was recently negative, and likely low yield.  The patient currently has PT, Home Health     in place, therefore, we will not restart this as symptoms have resolved. 2. Altered mental status.  This is likely multifactorial, possibly     related to current urinary tract infection with a baseline of     dementia or pseudo dementia with her depression and anxiety.     Possibly also due to long history of alcohol abuse and recent     discontinuation of alcohol after likely binge episode 10  days ago.     We will observe overnight, treat her urinary tract infection, check     alcohol level and UDS. She may ultimately require treatment of anxiety disorder; however, will avoid     benzodiazepines which would likely complicate the clinical picture. 3. Anxiety and depression.  This has worsened after recent loss of her     husband and discontinuation of lorazepam and Celexa.  The patient's     episode today could potentially have been a panic attack with her     preceding dyspnea, bilaterally hand numbness and feeling of     anxiety.  Since Celexa made the patient feel "loopy", we will     consider starting an alternative agent possibly Lexapro versus     sertraline for long-term therapy.  Will avoid benzodiazepines in     this elderly patient with a history of alcohol abuse.  It is likely     this is contributing to her perceived dementia given her lack of     effort on the mini-mental status exam and her history of substance     abuse. 4. Urinary tract infection.  The patient was recently discharged after     which time urine culture grew Klebsiella.  This organism was     sensitive to Rocephin and we will restart this treatment tonight.  We will     consider changing to oral Keflex or ciprofloxacin for continued     therapy after discharge.   Urine was recultured on admission.  The patient     previously denied symptoms, however, does endorse some dysuria     currently but is afebrile with no leukocytosis.  We will follow     temperature curve and clinical status. 5. Fluids, electrolytes, nutrition and gastrointestinal.  Heart-healthy diet and saline lock intravenous. 6. Prophylaxis.  Lovenox SQ daily for venous     thromboembolism prophylaxis. Fall precautions.  7. Disposition.  The patient's daughter is healthcare power of     attorney and is present at bedside informed of plan for     observation.  The patient is full code.  Discharge is pending     clinical workup.  However, daughter does desire for her mother to     return home after discharge.    ______________________________ Lloyd Huger, MD   ______________________________ Pearlean Brownie, M.D.    JK/MEDQ  D:  02/17/2011  T:  02/18/2011  Job:  [id number]  Electronically Signed by Lloyd Huger MD on 02/21/2011 08:43:17 PM Electronically Signed by Pearlean Brownie M.D. on 03/06/2011 02:22:03 PM

## 2011-03-07 ENCOUNTER — Ambulatory Visit: Payer: No Typology Code available for payment source | Admitting: Family Medicine

## 2011-03-11 DIAGNOSIS — I10 Essential (primary) hypertension: Secondary | ICD-10-CM

## 2011-03-11 DIAGNOSIS — R279 Unspecified lack of coordination: Secondary | ICD-10-CM

## 2011-03-11 DIAGNOSIS — F1010 Alcohol abuse, uncomplicated: Secondary | ICD-10-CM

## 2011-03-11 DIAGNOSIS — Z8673 Personal history of transient ischemic attack (TIA), and cerebral infarction without residual deficits: Secondary | ICD-10-CM

## 2011-03-11 DIAGNOSIS — M6281 Muscle weakness (generalized): Secondary | ICD-10-CM

## 2011-03-12 ENCOUNTER — Ambulatory Visit (INDEPENDENT_AMBULATORY_CARE_PROVIDER_SITE_OTHER): Payer: No Typology Code available for payment source | Admitting: Family Medicine

## 2011-03-12 ENCOUNTER — Ambulatory Visit: Payer: No Typology Code available for payment source | Admitting: Family Medicine

## 2011-03-12 ENCOUNTER — Encounter: Payer: Self-pay | Admitting: Family Medicine

## 2011-03-12 ENCOUNTER — Ambulatory Visit: Payer: Medicare (Managed Care) | Admitting: Family Medicine

## 2011-03-12 DIAGNOSIS — F329 Major depressive disorder, single episode, unspecified: Secondary | ICD-10-CM

## 2011-03-12 DIAGNOSIS — E785 Hyperlipidemia, unspecified: Secondary | ICD-10-CM

## 2011-03-12 DIAGNOSIS — R300 Dysuria: Secondary | ICD-10-CM

## 2011-03-12 DIAGNOSIS — G459 Transient cerebral ischemic attack, unspecified: Secondary | ICD-10-CM

## 2011-03-12 DIAGNOSIS — M1710 Unilateral primary osteoarthritis, unspecified knee: Secondary | ICD-10-CM

## 2011-03-12 LAB — POCT URINALYSIS DIPSTICK
Glucose, UA: NEGATIVE
Protein, UA: NEGATIVE
Urobilinogen, UA: 0.2

## 2011-03-12 NOTE — Progress Notes (Signed)
  Subjective:    Patient ID: [name], female    DOB: 1931-06-05, 77 y.o.   MRN: [id number]  HPI Patient is seen for followup several items. Refer to previous dictation. She had recent hospitalization with evidence for stroke and UTI. She has no urinary symptoms at this time but did not have any prior to her recent admission either. Denies any fever or chills. Finished out recent antibiotic.  She has had progressive left knee pain. Previous x-rays indicated osteoarthritis. Has some early morning stiffness. No recent injury. Pain not relieved with Tylenol. Pain is moderate to severe at times. Previous good success with steroid injection and she is requesting this today.  She has taken B12 injections previously. She is currently on oral multivitamin with B12. She has recent history of alcohol abuse but no alcohol use since coming to live with daughter.  Continue to deal with depression issues. Husband passed away last 2023/09/06. Currently taking Zoloft 25 mg daily. She feels sometimes dizzy in the morning and wonders of this is due to medication. No orthostatic symptoms. Depression relatively stable.  Recent initiation simvastatin 20 mg daily during her hospitalization with stroke. Tolerating without side effects.   Review of Systems  Constitutional: Negative for fever, chills, appetite change and unexpected weight change.  Respiratory: Negative for cough and shortness of breath.   Cardiovascular: Negative for chest pain, palpitations and leg swelling.  Gastrointestinal: Negative for abdominal pain.  Genitourinary: Negative for hematuria.  Skin: Negative for rash.  Neurological: Negative for syncope and headaches.       Objective:   Physical Exam  Constitutional: She is oriented to person, place, and time. She appears well-developed and well-nourished.  Cardiovascular: Normal rate and regular rhythm.  Exam reveals no gallop.   Pulmonary/Chest: Effort normal and breath sounds normal. No  respiratory distress. She has no wheezes. She has no rales.  Musculoskeletal: She exhibits no edema.       Left knee exam reveals no warts and no effusion. Good range of motion but does have some mild crepitus. Mild medial joint line tenderness.  Lymphadenopathy:    She has no cervical adenopathy.  Neurological: She is alert and oriented to person, place, and time. No cranial nerve deficit.  Skin: No rash noted.  Psychiatric: She has a normal mood and affect. Her behavior is normal. Thought content normal.          Assessment & Plan:  #1 cerebrovascular disease. Stable at this time. Recent x-rays were again reviewed. She had CT angiogram with 54% ICA lesion on one side and less than 50% other side.  Continue with Plavix. #2 hyperlipidemia. Schedule labs with lipid and hepatic in one month #3 history of recent complicated UTI. Urine dipstick today shows leukocytes and no nitrite no leuk trase blood. She is afebrile. Send culture. No antibiotic at this time unless develops a fever or new symptoms #4 history of depression with loss of husband several months ago. Continue sertraline 25 mg daily. Overall seems to be doing fairly well this time. Very supportive family #5 history of alcohol abuse currently abstinent now for several weeks #6 osteoarthritis left knee. Discussed risk and benefits of corticosteroid injection and patient consented. Prep left knee with Betadine. Using sterile technique injected 40 mg of Medrol 1 cc of plain Xylocaine using 23-gauge needle inferior and medial to patella without difficulty patient tolerated well

## 2011-03-13 ENCOUNTER — Encounter: Payer: Self-pay | Admitting: Family Medicine

## 2011-03-14 ENCOUNTER — Other Ambulatory Visit: Payer: Self-pay | Admitting: *Deleted

## 2011-03-14 MED ORDER — SERTRALINE HCL 25 MG PO TABS: 25.0000 mg | ORAL_TABLET | Freq: Every day | ORAL | Status: DC

## 2011-03-14 NOTE — Telephone Encounter (Signed)
Requesting rx refill of sertraline 25mg  sent to [facility].

## 2011-03-15 ENCOUNTER — Telehealth: Payer: Self-pay | Admitting: *Deleted

## 2011-03-15 NOTE — Telephone Encounter (Signed)
Pt would like her lab results °

## 2011-03-15 NOTE — Telephone Encounter (Signed)
Pt is asking for lab results.

## 2011-03-15 NOTE — Telephone Encounter (Signed)
I have not seen her lipid or hepatic results.  We need to check with lab to see where they are.

## 2011-03-16 ENCOUNTER — Telehealth: Payer: Self-pay

## 2011-03-16 LAB — URINE CULTURE: Colony Count: >=100000

## 2011-03-16 MED ORDER — CIPROFLOXACIN HCL 250 MG PO TABS: 250.0000 mg | ORAL_TABLET | Freq: Two times a day (BID) | ORAL | Status: DC

## 2011-03-16 NOTE — Telephone Encounter (Signed)
Lab to speak to Dr. [name] re: these labs.

## 2011-03-16 NOTE — Telephone Encounter (Signed)
Called pt she is aware of E. Coli and Cipro---pt stated that Cipro did not work well for her last time but she will take it; pt would also like to make physician aware that the medication that she is on for anxiety makes her so tired that she has to lay down and take a nap; pt believes that the medication dosage is too much for her---pls advise

## 2011-03-16 NOTE — Telephone Encounter (Signed)
Message copied by Earle Gell on Fri Mar 16, 2011  1:58 PM ------      Message from: Trinna Post      Created: Fri Mar 16, 2011  8:26 AM       Positive cx for E Coli.  We need to start Cipro 250 mg po bid for 7 days.

## 2011-03-16 NOTE — Telephone Encounter (Signed)
Positive urine cx for E coli.  Cipro 250 mg bid for 7 days.

## 2011-03-19 NOTE — H&P (Signed)
[name], [name]                ACCOUNT NO.:  [id number]  MEDICAL RECORD NO.:  [id number]          PATIENT TYPE:  LOCATION:                               FACILITY:  [facility]  PHYSICIAN:  Paula Compton, MD        DATE OF BIRTH:  1931/03/29  DATE OF ADMISSION: DATE OF DISCHARGE:                             HISTORY & PHYSICAL   PRIMARY CARE PHYSICIAN:  Unassigned.  CHIEF COMPLAINT:  Altered mental status, right facial droop.  HISTORY OF PRESENT ILLNESS:  The patient is an 77 year old female with a history of hypothyroidism who presented to the ER via EMS after episode of altered mental status, slurred speech, and right facial droop.  The patient had just finished eating supper when she states that she felt short of breath, heaviness in her chest, and became disoriented. Daughter states that she had a glazed-over look on her face.  She also noticed that her speech was slurred and she had a right facial droop. EMS was called and the patient transported to the ER.  In the ER, the facial droop and slurred speech resolved.  Family also stated at the time of admission, the patient's mentation was back to baseline.  The patient has no complaints and states that "she is fine."  The patient is upset that she is being recommended to stay overnight for observation. Family reports that over the past year or so, the patient has had cognitive changes, increased forgetfulness, and sometimes increased agitation.  ALLERGIES:  SULFA.  MEDICATIONS: 1. Levothyroxine 0.137 mg p.o. daily. 2. Lorazepam, unknown dosage p.r.n.  PAST MEDICAL HISTORY: 1. History of EtOH abuse with DTs. 2. Hypertension. 3. Hypothyroidism. 4. TIA.  PAST SURGICAL HISTORY: 1. Gallbladder removal. 2. Partial thyroid resection.  SOCIAL HISTORY:  The patient lives with daughter since 05-Sep-2023 when her husband passed away.  She denied tobacco use.  She drinks 3 beers per day.  She denies drug use.  The patient does have a  history of EtOH abuse.  The patient and daughter both confirm that the quantity of alcohol is much reduced compared to prior years.  FAMILY HISTORY:  Noncontributory.  REVIEW OF SYSTEMS:  Fatigue x2 weeks.  Positive forgetfulness x1 year. Positive headache today.  Dyspnea with episode as described in HPI. Left knee pain off and on times months.  No fevers, no chills, no sore throat, no chest pain, no orthopnea, no cough, no wheezing, no nausea, no vomiting, no diarrhea, no dysuria, no hematuria, no visual changes, and no polyuria.  Positive nosebleeds.  PHYSICAL EXAMINATION:  VITAL SIGNS:  Temperature 98.5, pulse 74, respiratory rate 18, blood pressure 143/77, and pulse ox 100% on room air. GENERAL:  No acute distress, resting quietly on stretcher, some agitation. HEENT:  Normocephalic and atraumatic. NECK:  No lymphadenopathy.  No thyromegaly. CARDIOVASCULAR:  Regular rate and rhythm.  No murmurs, rubs, or gallops. LUNGS:  Clear to auscultation bilaterally with good air movement. ABDOMEN:  Soft, nontender, and nondistended. EXTREMITIES:  No lower extremity edema. NEURO:  Cranial nerves II through XII grossly intact.  Sensation grossly intact.  The patient following all  commands. MUSCULOSKELETAL:  No joint redness.  Moving all 4 extremities.  5/5 strength.  Strength equal bilaterally.  Some arthritic changes of hands.  LABORATORY DATA AND STUDIES:  Urine drug screen negative.  INR 0.99. Urine micro:  Many bacteria, few epithelials, white blood cells 21-50. UA:  Large leukocyte esterase, negative urine nitrites, small hemoglobin.  Chest x-ray:  Cardiomegaly with vascular congestion.  CK total 104, CK-MB 2.6.  Alcohol level less than 5.  Repeat first set of cardiac enzymes:  CK total 105, CK-MB 3, and troponin 0.01.  White blood cells 7.7 and hemoglobin 13.4.  Sodium 137, potassium 3.9, creatinine 0.91, and glucose 115.  CT of head:  Cannot exclude acute or subacute left temporal  lobe infarct, atrophy, and small-vessel disease.  No intracranial bleed.  EKG shows sinus rhythm with right bundle branch block.  ASSESSMENT AND PLAN:  This is an 77 year old female with history of transient ischemic attack presenting with transient episodes of altered mental status, slurred speech, and right facial droop. 1. Questionable transient ischemic attack.  Neuro evaluation done in     the ER recommend MRI in a.m., already ordered per Neuro.  We will     order carotid Dopplers and 2-D echo, last echo in 2004 was within     normal limits.  We will risk stratify using lipid profile and A1c.     The patient requested check of B12 and TSH.  We will cycle cardiac     enzymes to ensure no cardiac event.  We will repeat EKG in the     morning.  We will order PT/OT consult for evaluation of the     patient's functional level and if any resources needed at home. 2. History of ethanol abuse, continues to use ethanol at a reduced     level.  CIWA protocol in place.  Thiamine, folate, and     multivitamins ordered.  We will watch closely for signs and     symptoms of withdrawal.  Has history of delirium tremens. 3. Hypothyroidism.  We will continue home dosage of levothyroxine. 4. Questionable urinary tract infection on UA.  The patient is not     having any signs or symptoms of urinary tract infection.  We will     not continue antibiotics.  Urine culture is pending. 5. Anxiety.  We will give home dose of lorazepam p.r.n. 6. Hypertension.  The patient is on blood pressure medications at     home.  We will monitor closely.  No indication to lower blood     pressure significantly in the setting of questionable transient     ischemic attack or questionable cerebrovascular accident.  We will     let blood pressure ride high if elevations occur. 7. Fluids, electrolytes, nutrition/gastrointestinal.  Regular diet     after swallow evaluation. 8. Prophylaxis, Lovenox. 9. Disposition, pending  clinical improvement and outpatient followup     appointments with new PCP.     Ellin Mayhew, MD   ______________________________ Paula Compton, MD    DC/MEDQ  D:  02/15/2011  T:  02/15/2011  Job:  [id number]  Electronically Signed by Ellin Mayhew  on 03/06/2011 02:27:25 PM Electronically Signed by Paula Compton MD on 03/19/2011 04:52:10 PM

## 2011-03-20 NOTE — Progress Notes (Signed)
Quick Note:  Left another message on pt home VM that she is to be taking Cipro for 7 days ______

## 2011-03-20 NOTE — Progress Notes (Signed)
Quick Note:  Another message left on pt home VM that Cipro has been called to her pharmacy and she is to be taking it for 7 days, call back with questions ______

## 2011-03-23 ENCOUNTER — Encounter: Payer: Self-pay | Admitting: Family Medicine

## 2011-03-23 ENCOUNTER — Ambulatory Visit (INDEPENDENT_AMBULATORY_CARE_PROVIDER_SITE_OTHER): Payer: No Typology Code available for payment source | Admitting: Family Medicine

## 2011-03-23 VITALS — BP 155/70 | Temp 98.2°F | Ht 60.5 in | Wt 126.0 lb

## 2011-03-23 DIAGNOSIS — M25569 Pain in unspecified knee: Secondary | ICD-10-CM

## 2011-03-23 NOTE — Progress Notes (Signed)
  Subjective:    Patient ID: [name], female    DOB: 11/25/1930, 77 y.o.   MRN: [id number]  HPI Patient seen with bilateral knee pain left greater than right. Refer to prior note. We injected cortisone L knee and she had only minimal relief for a few days. She has not noted any swelling. She has early morning stiffness. Patient seems somewhat confused at times. History somewhat unreliable. She is apparently taken Advil, Tylenol, and Aleve at times with little relief. She recalls x-rays but thinks this was at least a few years ago. No recent injury.  Past Medical History  Diagnosis Date  . Alcohol abuse   . Arthritis   . Depression   . Stroke    Past Surgical History  Procedure Date  . Cholecystectomy 2008  . Tonsillectomy and adenoidectomy   . Abdominal hysterectomy     partial  . Thyroid surgery     reports that she has never smoked. She does not have any smokeless tobacco history on file. Her alcohol and drug histories not on file. family history includes Arthritis in her mother; Cancer in her daughter, father, and sister; Diabetes in her brother; Hyperlipidemia in her mother; Hypertension in her brother, mother, and sister; Mental illness in her mother; and Stroke in her mother and sister. Allergies  Allergen Reactions  . Celexa     Shaky inside  . Sulfa Antibiotics     rash      Review of Systems  Constitutional: Negative for activity change.  Respiratory: Negative for shortness of breath.   Cardiovascular: Negative for chest pain and palpitations.  Musculoskeletal: Positive for arthralgias. Negative for myalgias, back pain and joint swelling.  Skin: Negative for rash.  Hematological: Negative for adenopathy.       Objective:   Physical Exam  Constitutional: She appears well-developed and well-nourished. No distress.  Cardiovascular: Normal rate, regular rhythm and normal heart sounds.   Pulmonary/Chest: Effort normal and breath sounds normal. No respiratory  distress. She has no wheezes. She has no rales.  Musculoskeletal: She exhibits no edema.       Both knees reveal fairly good range of motion. no effusion. No warmth or erythema. Mild to moderate medial joint line tenderness on the left. No leg edema  Neurological: She is alert. No cranial nerve deficit.       No focal weakness.          Assessment & Plan:  Bilateral knee pain left greater than right. Suspect significant degenerative arthritis. Continue Tylenol. Obtain x-rays to further evaluate.  If there is severe loss of joint space consider orthopedic referral.

## 2011-04-06 ENCOUNTER — Other Ambulatory Visit (INDEPENDENT_AMBULATORY_CARE_PROVIDER_SITE_OTHER): Payer: No Typology Code available for payment source | Admitting: Family Medicine

## 2011-04-06 DIAGNOSIS — E785 Hyperlipidemia, unspecified: Secondary | ICD-10-CM

## 2011-04-06 DIAGNOSIS — N390 Urinary tract infection, site not specified: Secondary | ICD-10-CM

## 2011-04-06 LAB — LIPID PANEL
Cholesterol: 179 mg/dL (ref 0–200)
HDL: 64.7 mg/dL (ref >39.00)
LDL Cholesterol: 96 mg/dL (ref 0–99)
Triglycerides: 93 mg/dL (ref 0.0–149.0)
VLDL: 18.6 mg/dL (ref 0.0–40.0)

## 2011-04-06 LAB — POCT URINALYSIS DIPSTICK
Glucose, UA: NEGATIVE
Nitrite, UA: NEGATIVE
Protein, UA: NEGATIVE
Spec Grav, UA: 1.015
Urobilinogen, UA: 0.2

## 2011-04-06 LAB — HEPATIC FUNCTION PANEL
ALT: 16 U/L (ref 0–35)
Albumin: 3.5 g/dL (ref 3.5–5.2)
Total Protein: 6.2 g/dL (ref 6.0–8.3)

## 2011-04-06 NOTE — Consult Note (Signed)
[name], [name]                            ACCOUNT NO.:  [id number]   MEDICAL RECORD NO.:  [id number]                   PATIENT TYPE:  INP   LOCATION:  6708                                 FACILITY:  [facility]   PHYSICIAN:  [name], M.D. [facility]           DATE OF BIRTH:  Aug 25, 1931   DATE OF CONSULTATION:  11/23/2003  DATE OF DISCHARGE:                                   CONSULTATION   GASTROENTEROLOGY CONSULTATION NOTE   REQUESTING PHYSICIAN:  Family Practice Teaching Service.   REASON FOR CONSULTATION:  Jaundice and abdominal pain.   HISTORY:  This is a 77 year old white woman who was visiting family here;  she resides in [place], [place].  She has had 1-1/2 weeks of right  upper quadrant epigastric pain which intensified this weekend and was  associated with nausea and vomiting.  She was admitted yesterday and has had  improvement in her symptoms.   Pertinent laboratory data shows a white count of 17, yesterday.  Bilirubin  7.2, alkaline phosphatase 482, SGOT 154, SGPT 117, amylase is 313, lipase  533.  She underwent an abdominal ultrasound which demonstrated extensive  sludge in the gallbladder with mass-like configuration.  The gallbladder  wall was 6 mm thick without pericholecystic solid fluid.  She had some mild  tenderness with the ultrasound pressure.  Common bile duct was 14 mm; no  stones identified.  There is mild intrahepatic ductal dilation.  She, again,  feels better.  She has tolerated some liquids today though she is not very  hungry.   PAST MEDICAL HISTORY:  1. Chronic alcohol use, 4-5 glasses of wine a day.  2. Hypertension.  3. Anxiety.  4. Hypothyroidism.  5. Colon polyp removed by Dr. Leatha Wiggins in [place], [place].  6. Urethral stricture.   MEDICATIONS:  [name] has been started today.  She is on Phenergan, morphine,  and Protonix.  She was on Synthroid, atenolol, Fibercon, and unknown  diuretic at home.   SOCIAL HISTORY:  She lives in  [place]; alcohol as above; was here visiting  family; no tobacco.   FAMILY HISTORY:  Mother and sister have had stroke.  No significant  gastrointestinal problems.   REVIEW OF SYSTEMS:  Her urine is dark. Her stools are pale. She has chronic  intermittent bladder infection from pyuria.  She has the history of urethral  dilations in [place].  She denies any other problems.  All other review of  systems are negative.   PHYSICAL EXAMINATION:  GENERAL:  Physical examination reveals a pleasant,  well-developed, white woman in no acute distress.  She is jaundiced.  She is  a little bit anxious.  VITAL SIGNS:  Temperature, maximum 100.5; blood pressure 152/78, pulse 88.  HEENT:  Eyes show icterus.  Mouth the palate is yellow.  NECK:  Supple.  No mass.  There is a previous thyroid surgery scar.  HEART:  S1-S2 no  murmurs or gallops.  ABDOMEN:  Soft.  Minimally tender in the epigastric and right upper  quadrant.  No masses, organomegaly or rebound.  LUNGS:  Clear.  EXTREMITIES:  No clubbing, cyanosis, or edema.  MENTAL STATUS:  She appears alert and oriented x3.  LYMPH NODES:  No neck or supraclavicular nodes.   LABORATORY DATA:  As summarized above.  She does have a UA with large  leukocytes, too numerous to count white blood cells, positive nitrates,  urine culture is pending.  Renal function is normal.  Alkaline phosphatase,  not mentioned above, was 538 yesterday; bilirubin was 4.7, yesterday.  The  numbers above are from today.  Albumin 3.5.   X-rays as summarized above.   ASSESSMENT:  A 77 year old white woman with probably mild cholangitis,  cholecystitis and perhaps mild pancreatitis, at least biochemically.  She is  clinically improved.  She may have passed a stone, perhaps.  She has chronic  alcohol abuse and anxiety and is at some risk for withdrawal from alcohol.  She has been started on Librium.  There is also a previous history of a  large colon polyp.  This has been  removed previously.   RECOMMENDATIONS AND PLAN:  1. Agree with IV Zosyn and pain control.  2. Plan for ERCP with sphincterotomy and stone extraction tomorrow.  3. Go ahead and check coagulations.  4. Watch for alcohol withdrawal problems.  5. Cover with benzodiazepines as appropriate.  6. Surgical consultation for a laparoscopic cholecystectomy; have discussed     this with Dr. Johna Wiggins.  7. I have explained the risks, benefits, indications of the ERCP with     sphincterotomy and stone extraction with the patient.  She is at somewhat     high risk of problems because of the alcohol issues and the potential     withdrawal plus her cholangitis and pancreatitis issues.  However, the     procedure is indicated and I think should be done prior to have     cholecystectomy. Her husband was present for the discussion.   I appreciate the opportunity to care for this patient.                                               [name], M.D. [facility]    CEG/MEDQ  D:  11/23/2003  T:  11/24/2003  Job:  [id number]   cc:   [name], M.D.  [address] [place]  [place] [address]  Fax: [phone]   [name], M.D.  [address]  [place]  [place] [address]  Fax: [phone]   [name], M.D.  [facility]  [place]  [place]

## 2011-04-06 NOTE — Op Note (Signed)
NAMESHAWN, DANNENBERG                            ACCOUNT NO.:  [id number]   MEDICAL RECORD NO.:  [id number]                   PATIENT TYPE:  INP   LOCATION:  6708                                 FACILITY:  [facility]   PHYSICIAN:  Sharlet Salina T. Hoxworth, M.D.          DATE OF BIRTH:  [birth year]   DATE OF PROCEDURE:  11/26/2003  DATE OF DISCHARGE:                                 OPERATIVE REPORT   PREOPERATIVE DIAGNOSIS:  Gallstone pancreatitis.   POSTOPERATIVE DIAGNOSIS:  Gallstone pancreatitis.   PROCEDURE:  Laparoscopic cholecystectomy with intraoperative cholangiogram.   SURGEON:  Sharlet Salina T. Hoxworth, M.D.   ASSISTANTAngelia Mould. Derrell Lolling, M.D.   ANESTHESIA:  General.   INDICATIONS FOR PROCEDURE:  Fiona Coto is a 77 year old white female who  presented with acute abdominal pain, elevated amylase, lipase and liver  function tests. She clinically improved  and has undergone an ERCP with  sphincterotomy with findings of an irritated papilla consistent with a  passed common bile duct stone but no stone within the duct. She has remained  clinically well, although has some persistently elevated liver function  tests with a bilirubin of approximately 8. A laparoscopic cholecystectomy  with cholangiogram  has been recommended and accepted. The nature  of the  procedure and its indications and risks of bleeding, infection, bile leak  and bile duct injury were discussed  and understood. She is now brought to  the operating room for this procedure.   DESCRIPTION OF PROCEDURE:  The patient was brought to the operating room and  placed in the supine position on the operating table. General endotracheal  anesthesia was induced. She was already on broad-spectrum antibiotics. The  abdomen was widely sterilely prepped and draped. Local anesthesia was used  to infiltrate the trocar sites prior to the incisions.   A 1-cm incision was made at the umbilicus  and dissection was carried down  to the  midline fascia which was sharply incised to 1 cm and the peritoneum  was entered under direct vision. Through a mattress suture of 0 Vicryl the  Hasson trocar was placed and pneumoperitoneum was established. Under direct  vision a 10-mm trocar was placed in the subxiphoid area and two 5-mm trocars  on the right subcostal margin.   The gallbladder was visualized and appeared mildly distended and edematous.  The fundus was grasped and elevated up over the liver and the infundibulum  retracted  inferolaterally. The anatomy was readily apparent with the common  bile duct visualized and appearing moderately dilated, as was the cystic  duct. The peritoneum anterior and posterior to Calot's triangle was incised  and the Calot's triangle was thoroughly dissected. The cystic duct  gallbladder junction was dissected 360 degrees and the cystic duct dissected  over about 1 cm.   The cystic duct was then clipped at the gallbladder junction. An operative  cholangiogram  was obtained through the cystic duct. This  showed a  moderately dilated common bile duct and common hepatic duct with good flow  into the liver and somewhat sluggish but fairly free flow into the duodenum  with no filling defects or stricture, all consistent with some residual  edema at the ampulla.   Following  this the cholangiocath was removed and the cystic duct was triply  clipped proximally and divided. The cystic artery which had been exposed at  Calot's triangle was doubly clipped proximally, clipped distally and  divided. The gallbladder  was then dissected free from its bed using hook  cautery and removed intact through the umbilicus.   The right upper quadrant was then thoroughly irrigated and suctioned and  complete hemostasis was assured. The trocar was removed under direct vision.  All CO2 was evacuated from the peritoneal cavity. The mattress suture was  secured at the umbilicus. The skin incisions were closed with  interrupted  subcuticular 4-0 Monocryl and Steri-Strips.   All sponge, instrument and needle counts were correct. Dry sterile dressings  were applied and the patient was taken to the recovery room in good  condition.                                               Lorne Skeens. Hoxworth, M.D.    Tory Emerald  D:  11/26/2003  T:  11/27/2003  Job:  [id number]

## 2011-04-06 NOTE — Consult Note (Signed)
NAMEIVONNE, FREEBURG                            ACCOUNT NO.:  [id number]   MEDICAL RECORD NO.:  [id number]                   PATIENT TYPE:  INP   LOCATION:  6708                                 FACILITY:  [facility]   PHYSICIAN:  Sharlet Salina T. Hoxworth, M.D.          DATE OF BIRTH:  [birth year]   DATE OF CONSULTATION:  11/23/2003  DATE OF DISCHARGE:                                   CONSULTATION   CHIEF COMPLAINT:  Abdominal pain.   HISTORY OF PRESENT ILLNESS:  I was asked by Dr. Leone Payor to evaluate Ms.  Riedinger.  She is a 77 year old white female who approximately one-and-a-half  weeks ago had the onset of epigastric pain radiating around both flanks to  her back.  The pain was initially worse in the back.  The pain was a  constant aching pain and did seem to get worse somewhat after eating.  Over  the next week to week-and-a-half the pain waxed and waned somewhat but  gradually increased in intensity.  Yesterday the pain became more severe and  she developed nausea and vomited once with a choking spell.  She presented  to [facility] for evaluation and was sent to the [facility] emergency  room where she was admitted by the teaching service.  The patient states she  has had some subjective fever the day of admission.  On questioning she has  had some postprandial right upper quadrant abdominal pain for some time and  avoids fatty or spicy foods.  Since admission she has been on antibiotics  and pain medication and at the time of this evaluation feels much better  than yesterday.  She has noted some dark urine.   PAST MEDICAL HISTORY:  Surgical:  1. Thyroidectomy for benign disease.  2. Colonoscopic polypectomy in 2003 with what sounds like some carcinoma in     situ.  3. She has had urethral dilatation.  4. Status post vaginal hysterectomy and rectocele and cystocele repair.   Medical:  1. Hypertension.  2. Anxiety and depression.   CURRENT MEDICATIONS:  1. Synthroid 112 mcg a  day.  2. Atenolol 50 mg a day.  3. Hydrochlorothiazide 25 mg a day.   Current hospital medications include p.r.n. morphine, Protonix, low-dose  Librium protocol, and today Zosyn 3.375 g IV q.6h. was started.   ALLERGIES:  SULFA ANTIBIOTICS.   SOCIAL HISTORY:  She is married.  Does not smoke cigarettes.  There is a  long history of significant daily alcohol use - her daughter states about a  bottle of wine per day.   FAMILY HISTORY:  She states she has had a brother with gallstones.   REVIEW OF SYSTEMS:  GENERAL:  No weight change.  Some fever and chills  yesterday.  HEENT:  No vision, hearing, swallowing problems.  LUNGS:  No  persistent wheezing, shortness of breath, cough.  CARDIAC:  No history of  MI, angina, chest  pain, palpitations.  ABDOMEN/GI:  As above.  GU:  No  urinary burning, frequency.  ORTHO:  Positive for some chronic pain in  shoulders and hands.  PSYCHIATRIC:  She has battled depression for a number  of years.   PHYSICAL EXAMINATION:  VITAL SIGNS:  Temperature is 98.3, pulse 88,  respirations 20, blood pressure 152/78.  GENERAL:  She is an alert, somewhat anxious white female in no acute  distress.  SKIN:  Warm, dry, without rash or infection.  HEENT:  Well-healed thyroidectomy scar.  No neck masses.  Sclerae slightly  icteric and nares and oropharynx clear.  LUNGS:  Clear to auscultation without increased work of breathing.  CARDIAC:  Regular rate and rhythm without murmurs.  No JVD or edema.  ABDOMEN:  Nondistended.  Normal active bowel sounds.  There is minimal  epigastric tenderness with no guarding, no masses, no hepatosplenomegaly  discernable.  EXTREMITIES:  No joint swelling, deformity.  NEUROLOGIC:  Anxious with on occasion noticed pressured or rambling speech.  Oriented x4.  Motor and sensory exams grossly normal.   LABORATORY DATA:  Amylase and lipase on admission were 313 and 533  respectively.  SGOT and SGPT were 181 and 111 respectively, down to  154 and  117 today.  Bilirubin was 4.7 on admission, up to 7.2 today.  Alkaline  phosphatase 538 on admission, down to 472 today.  Electrolytes abnormal for  potassium 3.3 and glucose is 170.  White count is 17,800; hemoglobin 14.6.   Ultrasound of the gallbladder has been obtained which shows sludge and small  stones with a slightly thickened 6 mm wall.  Common bile duct is enlarged to  14 mm.  No mass seen.   ASSESSMENT AND PLAN:  Apparent cholangitis and probable biliary pancreatitis  versus alcohol pancreatitis and possible acute cholecystitis as well.  With  rising LFTs agree with current plan for ERCP tomorrow.  She will benefit  from a laparoscopic cholecystectomy as her pancreatitis and infection  resolves.  She has comorbidities of significant alcohol abuse which may  become a factor, as well as hypertension, depression, and currently some  hyperglycemia.  Will follow with you.                                               Lorne Skeens. Hoxworth, M.D.    Tory Emerald  D:  11/23/2003  T:  11/24/2003  Job:  [id number]

## 2011-04-06 NOTE — Discharge Summary (Signed)
[name], [name]                            ACCOUNT NO.:  [id number]   MEDICAL RECORD NO.:  [id number]                   PATIENT TYPE:  INP   LOCATION:  5733                                 FACILITY:  [facility]   PHYSICIAN:  [name], M.D.                 DATE OF BIRTH:  17-Aug-1931   DATE OF ADMISSION:  11/22/2003  DATE OF DISCHARGE:  11/28/2003                                 DISCHARGE SUMMARY   DISCHARGE DIAGNOSES:  1. Postoperative laparoscopic cholecystectomy.  2. Acute pancreatitis.  3. Cholecystitis.  4. Cholangitis.  5. Hypertension.  6. Alcohol abuse.  7. Hypothyroidism.   PROCEDURES:  1. Laparoscopic cholecystectomy, November 26, 2003, with intraoperative     cholangiogram.  2. Endoscopic retrograde cholangiopancreatogram, November 24, 2003.   DISCHARGE MEDICATIONS:  1. Levoxyl 0.112 mcg p.o. daily.  2. Atenolol 25 mg p.o. daily.  3. Protonix 40 mg p.o. daily.  4. Percocet 5/325 mg 1 p.o. q.4 h. p.r.n. as needed for abdominal pain.  5. Motrin 800 mg 1 tab q.8 h. p.r.n. belly pain.   Dorothy patient is to hold her hydrochlorothiazide currently until followup.   PAIN MANAGEMENT:  As noted above.   ACTIVITY:  Activity as tolerated.  Dorothy patient is encouraged to walk a fair  amount.   DIET:  Diet is to include a low-fat diet.   WOUND CARE:  Keep surgical site clean and dry for 24 hours, afterwards use  soap and water and pat dry to clean.  It is okay to shower.   FOLLOWUP:  Dorothy patient is to follow up with [name] in approximately 3-4 weeks.  Dorothy patient is also to follow up with  Dr. Sharlet Salina T. Wiggins in 2 weeks.   HISTORY OF PRESENT ILLNESS:  Briefly, this was a 77 year old female who  presented with abdominal pain in Dorothy right upper quadrant and right flank x1-  1/2 weeks.  She had noticed that she had significant emesis.  She noticed  Dorothy pain was worse with spicy and greasy foods.  She has had a very  difficult time lying flat.   Dorothy pain had become more constant now.  She had  had previous episodes of pain similar to this with previous foods.  She has  a long history of alcohol consumption that had been progressively getting  worse.  No history of gallstones or gallbladder disease.  No fever, no  diarrhea, no mental status changes.  She said Dorothy pain does radiate to her  lower back, in between her shoulder blades.   LABORATORY DATA:  On initial laboratory exam, Dorothy white count was 17.5 with  95% neutrophils, hemoglobin was 14.6/42.3.  On basic metabolic, Dorothy sodium  was 134, potassium 3.3, 98/29, 12/07, and a glucose of 170.  Alkaline  phosphatase was 538, LDH was 262, amylase was 313, lipase was 533, AST was  181, ALT was 111.  Her urine revealed large leukocytes, too-numerous-to-  count white blood cells, positive nitrites, 40 ketones and specific gravity  1.020.   HOSPITAL COURSE:  Dorothy patient was admitted with Dorothy following diagnoses:   PROBLEM #1 - PANCREATITIS:  Dorothy patient had likely questionable alcoholic  pancreatitis versus gallstone pancreatitis at this point in time.  Dorothy  patient underwent an abdominal ultrasound which showed extensive sludge in  Dorothy gallbladder lumen, an echogenic focus with distal acoustical shadowing.  Dorothy gallbladder wall was 6 mm in thickness with no pericholecystic fluid.  Dorothy patient was tender over Dorothy gallbladder on ultrasound exam.  Common bile  duct was 14 mm in diameter, which revealed overall impression of chronic  cholecystitis, gallbladder wall thickening and gallstones with some sludge.  Biliary duct dilatation was noted.  At that point in time, surgery was  consulted, at which point in time Dorothy patient was also felt to have biliary  ductal disease secondary to increased bilirubin and increased LFTs with some  dilatation of Dorothy common bile duct.  She started to develop some scleral  icterus and some fevers.  At that point in time, antibiotics were started.  Dorothy  patient was started on Zosyn initially.  ERCP was then further scheduled  with [name].  Dorothy patient was placed on bowel rest and was  given adequate pain control.  After ERCP was performed, ERCP showed dilated  extrahepatic bile ducts and felt that Dorothy stone had passed prior to ERCP but  since admission.  Dorothy size of Dorothy duct at that point in time was 15 mm.  At  this point in time, it was recommended she proceed with a cholecystectomy.  Dr. Johna Wiggins was then consulted.  Patient underwent a laparoscopic  cholecystectomy on November 26, 2003.  Dorothy patient tolerated Dorothy procedure  well and continued to have elevated LFTs and have elevated bilirubin and  jaundice for several days.  That tended to trend down prior to discharge and  laboratory values tended to trend down before discharge; at Dorothy time of  discharge, Dorothy patient's AST was 58, ALT was 69, alkaline phosphatase was  350, bilirubin was 3.5.  She also had a cholesterol checked which showed a  total cholesterol of 211, LDL of 116, triglycerides of 47, HDL of 86.  Dorothy  patient was able to tolerate oral pain medicines at time of discharge, was  able to tolerate p.o. diet and will follow up as noted above with Dr.  Johna Wiggins and [name] for followup for her  cholecystectomy.   PROBLEM #2 - HYPOTHYROIDISM:  Dorothy patient's had a TSH performed during  hospitalization.  TSH at that point was 6.107.  We increased her dose of  Levoxyl at that point in time.  Dorothy patient was tolerating new medication  well.   PROBLEM #3 - HYPERTENSION:  Dorothy patient had episodes of low blood pressures  and Dorothy hydrochlorothiazide had been held.  She continued with atenolol at a  lower dose and was able to tolerate it, although she still had low pulses  and low resting heart rate of approximately 64.  We will monitor and will  follow up with a new dose of atenolol as an outpatient.   PROBLEM #4 - ALCOHOL ABUSE:  Dorothy patient adamantly  denies any significant  alcohol abuse, although family supports that she is drinking quite  substantially more than Dorothy patient admits.  Dorothy patient denies any problem  at this  point in time and does not desire any treatment or any therapy.  No DTs were  observed during hospitalization and would recommend followup as an  outpatient course with possible substance abuse therapy.  Dorothy patient was  discharged on November 28, 2003 in stable and improved condition and will  follow up as noted above.      Alvira Philips, M.D.                      [name], M.D.    RM/MEDQ  D:  02/18/2004  T:  02/21/2004  Job:  [id number]   cc:   Lorne Skeens. Wiggins, M.D.  [address]  [place]  [place] [address]  Fax: [phone]   Iva Wiggins, M.D. [facility]

## 2011-04-10 ENCOUNTER — Telehealth: Payer: Self-pay | Admitting: *Deleted

## 2011-04-10 NOTE — Telephone Encounter (Signed)
Labs all OK except for leukocytes on UA.  If pt has no urinary symptoms would observe for now.

## 2011-04-10 NOTE — Telephone Encounter (Signed)
Pt is asking for test results. ? Knee xray?

## 2011-04-10 NOTE — Telephone Encounter (Signed)
Per Dr [name], she is talking about labs drawn on 04/06/2011.

## 2011-04-10 NOTE — Telephone Encounter (Signed)
LMTCB

## 2011-04-10 NOTE — Telephone Encounter (Signed)
We ordered knee x-ray but it doesn't appear that she went.  She needs to get x-ray if she hasn't.

## 2011-04-11 NOTE — Telephone Encounter (Signed)
LMTCB

## 2011-04-12 ENCOUNTER — Telehealth: Payer: Self-pay | Admitting: Family Medicine

## 2011-04-12 NOTE — Telephone Encounter (Signed)
Pt informed again on VM all labs OK

## 2011-04-12 NOTE — Telephone Encounter (Signed)
Given results.  Advised pt that we need to get that knee xray before any referral or any more injections or treatment. Will try to find a ride.

## 2011-04-12 NOTE — Progress Notes (Signed)
Quick Note:  Pt informed ______ 

## 2011-04-12 NOTE — Telephone Encounter (Signed)
Triage vm-------wants lab results.

## 2011-04-20 ENCOUNTER — Telehealth: Payer: Self-pay | Admitting: Family Medicine

## 2011-04-20 NOTE — Telephone Encounter (Signed)
Wants [name] to return call. She is requesting a thorough lab report. Also, she is unable to go Xray. Too nervous to drive with all the traffic.

## 2011-04-20 NOTE — Telephone Encounter (Signed)
Pt called requesting a copy of her labs and her meds with explaination of what she is taking each of her meds for. Also, her knee is "killing me", has not had x-ray yet due to transportation issues.  She thinks she needs a referral to a specialist.  Encouraged her to get the x-ray soon. FYI

## 2011-04-25 ENCOUNTER — Other Ambulatory Visit: Payer: Self-pay | Admitting: Family Medicine

## 2011-04-25 MED ORDER — LEVOTHYROXINE SODIUM 137 MCG PO TABS: 137.0000 ug | ORAL_TABLET | Freq: Every day | ORAL | Status: DC

## 2011-04-25 NOTE — Telephone Encounter (Signed)
Rx sent, pt daughter informed on VM

## 2011-04-25 NOTE — Telephone Encounter (Signed)
Pt called and said [facility] [address] Sent a refill req for Levothyroxine 137 mg with no response. Pt is leaving to go out of town and needs this called today.

## 2011-05-01 ENCOUNTER — Ambulatory Visit (INDEPENDENT_AMBULATORY_CARE_PROVIDER_SITE_OTHER)
Admission: RE | Admit: 2011-05-01 | Discharge: 2011-05-01 | Disposition: A | Discharge status: Home / Self Care | Payer: No Typology Code available for payment source | Source: Ambulatory Visit | Attending: Family Medicine | Admitting: Family Medicine

## 2011-05-01 ENCOUNTER — Telehealth: Payer: Self-pay | Admitting: *Deleted

## 2011-05-01 DIAGNOSIS — M25569 Pain in unspecified knee: Secondary | ICD-10-CM

## 2011-05-01 NOTE — Telephone Encounter (Signed)
Received a call from [name] from [place], pt requesting x-ray of her right knee also, pain, order OK per Dr [name]

## 2011-05-02 NOTE — Progress Notes (Signed)
Quick Note:  Pt informed on home VM ______ 

## 2011-05-03 ENCOUNTER — Ambulatory Visit (INDEPENDENT_AMBULATORY_CARE_PROVIDER_SITE_OTHER): Payer: No Typology Code available for payment source | Admitting: Family Medicine

## 2011-05-03 ENCOUNTER — Encounter: Payer: Self-pay | Admitting: Family Medicine

## 2011-05-03 VITALS — BP 140/60 | Temp 97.7°F | Wt 128.0 lb

## 2011-05-03 DIAGNOSIS — M1710 Unilateral primary osteoarthritis, unspecified knee: Secondary | ICD-10-CM

## 2011-05-03 DIAGNOSIS — IMO0002 Reserved for concepts with insufficient information to code with codable children: Secondary | ICD-10-CM

## 2011-05-03 DIAGNOSIS — R300 Dysuria: Secondary | ICD-10-CM

## 2011-05-03 LAB — POCT URINALYSIS DIPSTICK
Ketones, UA: NEGATIVE
Spec Grav, UA: 1.005
pH, UA: 5

## 2011-05-03 MED ORDER — CEPHALEXIN 500 MG PO CAPS: 500.0000 mg | ORAL_CAPSULE | Freq: Three times a day (TID) | ORAL | Status: AC

## 2011-05-03 NOTE — Progress Notes (Signed)
  Subjective:    Patient ID: [name], female    DOB: 07/13/1931, 77 y.o.   MRN: [id number]  HPI Patient seen for the following issues  She is somewhat of a poor historian but apparently is concerned about some dysuria. No clear history of burning. Does have some chronic frequency. Denies fever or chills. No back pain.  Osteoarthritis involving both knees with symptoms left knee greater than right. Recent x-rays revealed degenerative arthritis left knee greater than right. Tylenol minimal relief. No edema or effusion   Review of Systems  Constitutional: Negative for fever, chills and appetite change.  Respiratory: Negative for cough and shortness of breath.   Cardiovascular: Negative for chest pain.  Genitourinary: Positive for dysuria. Negative for hematuria.  Musculoskeletal: Positive for arthralgias. Negative for joint swelling.  Psychiatric/Behavioral: Positive for dysphoric mood.       Objective:   Physical Exam  Constitutional: She appears well-developed and well-nourished.  Cardiovascular: Normal rate and regular rhythm.   Pulmonary/Chest: Breath sounds normal. No respiratory distress. She has no wheezes. She has no rales.  Musculoskeletal: She exhibits no edema.       Good range of motion both knees. No effusion. Mild crepitus with left knee. No significant tenderness to palpation. Ligament testing normal.  Neurological: She is alert. No cranial nerve deficit.  Psychiatric: She has a normal mood and affect.          Assessment & Plan:  #1 dysuria. Check urine dipstick. Pyuria noted .  Urine cx.  Keflex pending cx. #2 osteoarthritis left knee greater than right-by X-ray and symptoms. Poor relief with Tylenol. Patient not a good candidate for nonsteroidals. Requesting steroid injection. Previous injection helped only temporarily. Risk and benefits injection discussed. Patient consents.  Discussed limit of no more than 2 in 6 months.  L knee prepped with betadine and  using sterile technique 40 mg depomedrol and 2 cc plain xylocaine injected L knee with 23 gauge one and one half inch needle without difficulty.  Pt tolerated well.

## 2011-05-06 LAB — URINE CULTURE

## 2011-05-07 NOTE — Progress Notes (Signed)
Quick Note:  Pt informed, she states she is feeling better, however she is not taking Keflex 3 times a day because "I am so allergic to medicines, makes me sick on my stomach". Pt is taking keflex 2 times a day. Pt instructed to finish the full coarse of the medicine and she voiced her understanding. ______

## 2011-05-15 ENCOUNTER — Telehealth: Payer: Self-pay | Admitting: Family Medicine

## 2011-05-15 NOTE — Telephone Encounter (Signed)
Pt would like someone to call her. She did not elaborate.

## 2011-05-16 NOTE — Telephone Encounter (Signed)
Attempted call, no answer, will try again tomorrow.

## 2011-05-17 NOTE — Telephone Encounter (Signed)
LMTCB on home VM [phone], mobile number "no longer in service"

## 2011-05-18 ENCOUNTER — Telehealth: Payer: Self-pay | Admitting: *Deleted

## 2011-05-18 ENCOUNTER — Other Ambulatory Visit: Payer: Self-pay | Admitting: *Deleted

## 2011-05-18 MED ORDER — CLOPIDOGREL BISULFATE 75 MG PO TABS: 75.0000 mg | ORAL_TABLET | Freq: Every day | ORAL | Status: DC

## 2011-05-18 MED ORDER — SIMVASTATIN 20 MG PO TABS: 20.0000 mg | ORAL_TABLET | Freq: Every day | ORAL | Status: DC

## 2011-05-18 NOTE — Telephone Encounter (Signed)
Pt called back to get xray results from knee. Pt said that she could not understand the vm that was left for her. Pls call back asap.

## 2011-05-18 NOTE — Telephone Encounter (Signed)
Pt needs refill Plavix.

## 2011-05-18 NOTE — Telephone Encounter (Signed)
Yes, she should remain on.

## 2011-05-18 NOTE — Telephone Encounter (Signed)
Pt requesting a refill, however she wanted to check with Dr [name] first to see if she is still supposed to be taking it.  Pharmacy suggested she find out for sure.

## 2011-05-18 NOTE — Telephone Encounter (Signed)
Pt informed

## 2011-06-04 ENCOUNTER — Encounter: Payer: Self-pay | Admitting: Family Medicine

## 2011-06-04 ENCOUNTER — Ambulatory Visit (INDEPENDENT_AMBULATORY_CARE_PROVIDER_SITE_OTHER): Payer: No Typology Code available for payment source | Admitting: Family Medicine

## 2011-06-04 DIAGNOSIS — E039 Hypothyroidism, unspecified: Secondary | ICD-10-CM

## 2011-06-04 DIAGNOSIS — F32A Depression, unspecified: Secondary | ICD-10-CM

## 2011-06-04 DIAGNOSIS — M1710 Unilateral primary osteoarthritis, unspecified knee: Secondary | ICD-10-CM

## 2011-06-04 DIAGNOSIS — R300 Dysuria: Secondary | ICD-10-CM

## 2011-06-04 DIAGNOSIS — F329 Major depressive disorder, single episode, unspecified: Secondary | ICD-10-CM

## 2011-06-04 DIAGNOSIS — F1010 Alcohol abuse, uncomplicated: Secondary | ICD-10-CM

## 2011-06-04 DIAGNOSIS — M179 Osteoarthritis of knee, unspecified: Secondary | ICD-10-CM

## 2011-06-04 DIAGNOSIS — K635 Polyp of colon: Secondary | ICD-10-CM

## 2011-06-04 DIAGNOSIS — D126 Benign neoplasm of colon, unspecified: Secondary | ICD-10-CM

## 2011-06-04 DIAGNOSIS — G459 Transient cerebral ischemic attack, unspecified: Secondary | ICD-10-CM

## 2011-06-04 LAB — POCT URINALYSIS DIPSTICK
Glucose, UA: NEGATIVE
Ketones, UA: NEGATIVE
Protein, UA: NEGATIVE

## 2011-06-04 NOTE — Progress Notes (Signed)
  Subjective:    Patient ID: [name], female    DOB: May 11, 1931, 77 y.o.   MRN: [id number]  HPI Patient seen for medical followup. History of hypothyroidism, TIA with hospitalization several months ago no recurrent symptoms since then, depression, widowed October of 2011, hyperlipidemia, osteoarthritis involving mostly left knee, and history of alcohol abuse.  Recurrent UTI. Gives history of what sounds like urethral stricture with dilatation previously. No recent obstructive symptoms. Escherichia coli UTI in June. Somewhat of a poor historian. Still has occasional urinary symptoms but overall improved from past month. No fever or chills.  Reports prior history of colon polyps. Reported colonoscopy 2007 that per patient 5 year followup recommended. No family history of colon cancer. She wishes to establish with gastroenterologist for consideration of repeat colonoscopy  Osteoarthritis most  left knee. Confirmed by x-rays. No injury. Early morning stiffness. Pain is progressive. Only very temporary improvement with recent corticosteroid injection. Patient requests orthopedic referral  Past Medical History  Diagnosis Date  . Alcohol abuse   . Arthritis   . Depression   . Stroke    Past Surgical History  Procedure Date  . Cholecystectomy 2008  . Tonsillectomy and adenoidectomy   . Abdominal hysterectomy     partial  . Thyroid surgery     reports that she has never smoked. She does not have any smokeless tobacco history on file. Her alcohol and drug histories not on file. family history includes Arthritis in her mother; Cancer in her daughter, father, and sister; Diabetes in her brother; Hyperlipidemia in her mother; Hypertension in her brother, mother, and sister; Mental illness in her mother; and Stroke in her mother and sister. Allergies  Allergen Reactions  . Celexa     Shaky inside  . Sulfa Antibiotics     rash      Review of Systems  Constitutional: Negative for fever,  chills and fatigue.  Cardiovascular: Negative for chest pain, palpitations and leg swelling.  Gastrointestinal: Negative for abdominal pain, blood in stool and abdominal distention.  Genitourinary: Negative for dysuria, hematuria and flank pain.  Musculoskeletal: Positive for arthralgias. Negative for back pain.  Psychiatric/Behavioral: Positive for dysphoric mood. Negative for suicidal ideas.       Objective:   Physical Exam  Constitutional: She appears well-developed and well-nourished. No distress.  HENT:  Mouth/Throat: Oropharynx is clear and moist.  Neck: Neck supple.  Cardiovascular: Normal rate and regular rhythm.   Pulmonary/Chest: Effort normal and breath sounds normal. No respiratory distress. She has no wheezes. She has no rales.  Musculoskeletal: She exhibits no edema.  Lymphadenopathy:    She has no cervical adenopathy.  Neurological: She is alert.          Assessment & Plan:  #1 recurrent UTI. Reassess urine today. Consider urology referral if she continues to have recurrence. Reported history of what sounds like urethral stricture but no obstructive symptoms at this time #2 osteoarthritis mostly left knee. Orthopedic referral.  She is not seen improvement with corticosteroid injection #3 history of reported colon polyps. GI referral. Question if repeat colonoscopy due this time. Review old records.  Per patient due for repeat colonoscopy 2012 with last colonoscopy 2007. #4 history of alcohol abuse.  Discussed again importance of limiting alcohol use #5 history of TIA. No recurrent symptoms #6 history of hypothyroidism. Thyroid reassessed in March and normal range

## 2011-06-06 ENCOUNTER — Other Ambulatory Visit: Payer: Self-pay | Admitting: Family Medicine

## 2011-06-06 DIAGNOSIS — Z1231 Encounter for screening mammogram for malignant neoplasm of breast: Secondary | ICD-10-CM

## 2011-06-08 ENCOUNTER — Encounter: Payer: Self-pay | Admitting: Internal Medicine

## 2011-06-11 ENCOUNTER — Telehealth: Payer: Self-pay | Admitting: Family Medicine

## 2011-06-11 NOTE — Telephone Encounter (Signed)
Could we call [facility] to make sure they have gotten referral and are working on appt?

## 2011-06-11 NOTE — Telephone Encounter (Signed)
[name] has faxed referral info to [facility] on 7/16 and they are to contact pt with date/time for appt.  I spoke with pt and she does not have an appt yet, has not heard from them. Please advise

## 2011-06-11 NOTE — Telephone Encounter (Signed)
Pt. Is waiting on a referral to [facility] and was interested in getting a cortazone shot to lessen pain in knee. Please contact pt.

## 2011-06-12 NOTE — Telephone Encounter (Signed)
Call [facility] they have attempted to contact the patient in regards to getting previous records from physician in [place].  They will need these records prior to scheduling her appt.  LMOM for patient to call back.  Also spoke with Judy(patient's daughter) and she stated she would inform patient to return call to [facility].

## 2011-06-14 ENCOUNTER — Encounter: Payer: Self-pay | Admitting: Internal Medicine

## 2011-06-20 ENCOUNTER — Ambulatory Visit: Payer: Medicare (Managed Care) | Admitting: Family Medicine

## 2011-06-27 ENCOUNTER — Encounter: Payer: Self-pay | Admitting: Family Medicine

## 2011-06-27 ENCOUNTER — Ambulatory Visit (INDEPENDENT_AMBULATORY_CARE_PROVIDER_SITE_OTHER): Payer: No Typology Code available for payment source | Admitting: Family Medicine

## 2011-06-27 VITALS — BP 140/80 | Temp 98.1°F | Wt 125.0 lb

## 2011-06-27 DIAGNOSIS — M1710 Unilateral primary osteoarthritis, unspecified knee: Secondary | ICD-10-CM

## 2011-06-27 MED ORDER — TRAMADOL HCL 50 MG PO TABS: 50.0000 mg | ORAL_TABLET | Freq: Four times a day (QID) | ORAL | Status: AC | PRN

## 2011-06-27 NOTE — Progress Notes (Signed)
  Subjective:    Patient ID: [name], female    DOB: November 24, 1930, 77 y.o.   MRN: [id number]  HPI Followup with persistent complaints arthritis in knees left greater than right. Previous x-ray showed moderate arthritis. She's had 2 cortisone injections both of which helped temporarily. She is requesting another today we explained we cannot do another one this soon. She's tried tylenol without relief. She does not recall previous tramadol. In general, sensitive to medications. Denies recent fall. Ambulates with assistance of cane   Review of Systems  Constitutional: Negative for fever, chills, appetite change and fatigue.  Respiratory: Negative for cough and shortness of breath.   Cardiovascular: Negative for chest pain.  Gastrointestinal: Negative for abdominal pain.  Musculoskeletal: Negative for back pain.       Objective:   Physical Exam  Constitutional: She appears well-developed and well-nourished.  Cardiovascular: Normal rate and regular rhythm.   Pulmonary/Chest: Effort normal and breath sounds normal. No respiratory distress. She has no wheezes. She has no rales.  Musculoskeletal:       Moderate crepitus left knee. No erythema. No warmth. No effusion.          Assessment & Plan:  Osteoarthritis knees. Explained to patient we cannot inject cortisone at this time as already 2 injections over past 6 months. Trial of tramadol 50 mg one every 6 hours as needed. Continue cane for support.  Discussed appropriate exercises.

## 2011-07-03 ENCOUNTER — Other Ambulatory Visit: Payer: Medicare (Managed Care) | Admitting: Internal Medicine

## 2011-07-09 ENCOUNTER — Telehealth: Payer: Self-pay | Admitting: Internal Medicine

## 2011-07-09 ENCOUNTER — Ambulatory Visit: Payer: Medicare (Managed Care) | Admitting: Internal Medicine

## 2011-07-09 NOTE — Telephone Encounter (Signed)
No dont charge

## 2011-07-13 ENCOUNTER — Ambulatory Visit: Payer: No Typology Code available for payment source | Admitting: Family Medicine

## 2011-07-16 ENCOUNTER — Ambulatory Visit: Payer: No Typology Code available for payment source | Admitting: Family Medicine

## 2011-07-17 ENCOUNTER — Ambulatory Visit (INDEPENDENT_AMBULATORY_CARE_PROVIDER_SITE_OTHER): Payer: No Typology Code available for payment source | Admitting: Family Medicine

## 2011-07-17 ENCOUNTER — Encounter: Payer: Self-pay | Admitting: Family Medicine

## 2011-07-17 VITALS — BP 130/68 | Temp 98.6°F | Wt 126.0 lb

## 2011-07-17 DIAGNOSIS — M1710 Unilateral primary osteoarthritis, unspecified knee: Secondary | ICD-10-CM

## 2011-07-17 DIAGNOSIS — R238 Other skin changes: Secondary | ICD-10-CM

## 2011-07-17 DIAGNOSIS — R5383 Other fatigue: Secondary | ICD-10-CM

## 2011-07-17 DIAGNOSIS — R5381 Other malaise: Secondary | ICD-10-CM

## 2011-07-17 LAB — POCT URINALYSIS DIPSTICK
Bilirubin, UA: NEGATIVE
Nitrite, UA: NEGATIVE
pH, UA: 6

## 2011-07-17 NOTE — Patient Instructions (Signed)
Stop Sertraline Continue all other medications Try to start more regular exercise.

## 2011-07-17 NOTE — Progress Notes (Signed)
  Subjective:    Patient ID: [name], female    DOB: Oct 09, 1931, 77 y.o.   MRN: [id number]  HPI Patient has history of hypothyroidism, previous TIA, depression history, hyperlipidemia, osteoarthritis of knees, and remote history of alcohol abuse. She is currently living in assisted living and has adapted very well. Her husband passed away last 09-07-2023. She has been on low-dose sertraline 25 mg daily but feels this makes her fatigued. She denies any depressive symptoms whatsoever. She is requesting stopping this medication this time. She is compliant she states with thyroid medication. Recent TSH normal in March.  She has urine frequency which is a frequent complaint. No incontinence. Denies recent burning with urination. No fever or chills.  Ongoing complaints of left knee pain. Previous x-rays have shown moderate arthritis. She's had a couple of cortisone injections was held briefly. We have expressed a reluctance to inject again the same. We've made orthopedic referral but this was never made the orthopedist as they were awaiting old records. Patient states her knee actually feels somewhat better today. No locking or giving way. She has access to exercise facility but is not using   Review of Systems  Constitutional: Negative for fever and chills.  Respiratory: Negative for shortness of breath.   Cardiovascular: Negative for chest pain, palpitations and leg swelling.  Gastrointestinal: Negative for abdominal pain.  Genitourinary: Positive for frequency. Negative for dysuria and hematuria.  Musculoskeletal: Positive for arthralgias. Negative for joint swelling.  Neurological: Negative for headaches.  Psychiatric/Behavioral: Negative for confusion and dysphoric mood.       Objective:   Physical Exam  Constitutional: She appears well-developed and well-nourished.  HENT:  Right Ear: External ear normal.  Left Ear: External ear normal.  Eyes: Pupils are equal, round, and reactive to  light.  Neck: Neck supple.  Cardiovascular: Normal rate, regular rhythm and normal heart sounds.   Pulmonary/Chest: Effort normal and breath sounds normal. No respiratory distress. She has no wheezes. She has no rales.  Musculoskeletal:       Left knee reveals no effusion. Full range of motion. No warmth. No erythema. Minimal medial joint line tenderness  Lymphadenopathy:    She has no cervical adenopathy.  Neurological: She is alert.  Psychiatric: She has a normal mood and affect. Her behavior is normal.          Assessment & Plan:  #1 fatigue. Likely multifactorial. Recent thyroid normal. Increase exercise. Discontinue sertraline which is of questionable benefit at this time. #2 urine frequency. Urine dipstick reveals some pyuria but no nitrites. Suspects she has some urine urgency. #3 osteoarthritis involving knees. Would not inject with corticosteroid again at this point. She has had two injections over  the past few months. We talked about establishing regular exercise and reviewed some appropriate quad exercises. #4 history of TIA. Patient several questions today regarding Plavix especially with some bruising. We have expressed our reluctance for her to stop at this time.

## 2011-07-24 ENCOUNTER — Ambulatory Visit (HOSPITAL_COMMUNITY): Admission: RE | Admit: 2011-07-24 | Payer: Medicare (Managed Care) | Source: Ambulatory Visit

## 2011-08-08 ENCOUNTER — Ambulatory Visit: Payer: No Typology Code available for payment source | Admitting: Internal Medicine

## 2011-08-15 ENCOUNTER — Ambulatory Visit: Payer: Medicare (Managed Care) | Admitting: Internal Medicine

## 2011-08-15 ENCOUNTER — Ambulatory Visit (HOSPITAL_COMMUNITY): Payer: Medicare (Managed Care)

## 2011-08-16 ENCOUNTER — Ambulatory Visit: Payer: Medicare (Managed Care) | Admitting: Internal Medicine

## 2011-08-17 ENCOUNTER — Ambulatory Visit (INDEPENDENT_AMBULATORY_CARE_PROVIDER_SITE_OTHER): Payer: Medicare (Managed Care) | Admitting: Family Medicine

## 2011-08-17 ENCOUNTER — Encounter: Payer: Self-pay | Admitting: Family Medicine

## 2011-08-17 DIAGNOSIS — R5383 Other fatigue: Secondary | ICD-10-CM

## 2011-08-17 DIAGNOSIS — M1710 Unilateral primary osteoarthritis, unspecified knee: Secondary | ICD-10-CM

## 2011-08-17 DIAGNOSIS — R5381 Other malaise: Secondary | ICD-10-CM

## 2011-08-17 DIAGNOSIS — E785 Hyperlipidemia, unspecified: Secondary | ICD-10-CM

## 2011-08-17 DIAGNOSIS — M179 Osteoarthritis of knee, unspecified: Secondary | ICD-10-CM

## 2011-08-17 DIAGNOSIS — E039 Hypothyroidism, unspecified: Secondary | ICD-10-CM

## 2011-08-17 DIAGNOSIS — G459 Transient cerebral ischemic attack, unspecified: Secondary | ICD-10-CM

## 2011-08-17 LAB — CBC WITH DIFFERENTIAL/PLATELET
Basophils Absolute: 0 10*3/uL (ref 0.0–0.1)
Eosinophils Absolute: 0.2 10*3/uL (ref 0.0–0.7)
Lymphocytes Relative: 22.2 % (ref 12.0–46.0)
MCHC: 33.4 g/dL (ref 30.0–36.0)
Neutrophils Relative %: 64.6 % (ref 43.0–77.0)
RBC: 4.18 Mil/uL (ref 3.87–5.11)
RDW: 12.4 % (ref 11.5–14.6)

## 2011-08-17 LAB — BASIC METABOLIC PANEL
BUN: 12 mg/dL (ref 6–23)
CO2: 29 mEq/L (ref 19–32)
Calcium: 9.2 mg/dL (ref 8.4–10.5)
Creatinine, Ser: 0.5 mg/dL (ref 0.4–1.2)
Glucose, Bld: 87 mg/dL (ref 70–99)
Potassium: 3.9 mEq/L (ref 3.5–5.1)
Sodium: 140 mEq/L (ref 135–145)

## 2011-08-17 NOTE — Patient Instructions (Signed)
Remember to get your flu vaccine during the next month.

## 2011-08-17 NOTE — Progress Notes (Signed)
Subjective:    Patient ID: [name], female    DOB: 07/26/1931, 77 y.o.   MRN: [id number]  HPI Patient seen for medical followup. She has history of hypothyroidism, past history of TIA, history depression, hyperlipidemia, arthritis and past history of alcohol abuse. Currently resides in assisted living. She continues to complain of excessive fatigue. She thought this was related to his sertraline but fatigue has persisted after discontinuing that. She denies increase in depressive symptoms.  Patient relates she is sleeping fairly well. Occasional urinary urgency but no burning with urination. No significant nocturia. She states she is exercising occasionally. She is concerned because of increased sugar intake in her diet. Last TSH last March normal range. Recent lipids at goal. Patient denies any chest pains or dyspnea. No fever or chills.  Continues to complain of L knee pain.  No edema and no instability. We referred to ortho several weeks ago but she was apparently never given appointment.  Past Medical History  Diagnosis Date  . Alcohol abuse   . Arthritis   . Depression   . Stroke    Past Surgical History  Procedure Date  . Cholecystectomy 2008  . Tonsillectomy and adenoidectomy   . Abdominal hysterectomy     partial  . Thyroid surgery     reports that she has never smoked. She does not have any smokeless tobacco history on file. Her alcohol and drug histories not on file. family history includes Arthritis in her mother; Cancer in her daughter, father, and sister; Diabetes in her brother; Hyperlipidemia in her mother; Hypertension in her brother, mother, and sister; Mental illness in her mother; and Stroke in her mother and sister. Allergies  Allergen Reactions  . Celexa     Shaky inside  . Sulfa Antibiotics     rash      Review of Systems  Constitutional: Positive for fatigue and unexpected weight change. Negative for fever, chills and appetite change.  HENT: Negative  for trouble swallowing.   Eyes: Negative for visual disturbance.  Respiratory: Negative for cough, shortness of breath and wheezing.   Cardiovascular: Negative for chest pain, palpitations and leg swelling.  Gastrointestinal: Negative for abdominal pain.  Genitourinary: Negative for dysuria.  Musculoskeletal: Positive for arthralgias. Negative for myalgias.  Skin: Negative for rash.  Neurological: Negative for dizziness and headaches.  Hematological: Negative for adenopathy.       Objective:   Physical Exam  Constitutional: She is oriented to person, place, and time. She appears well-developed and well-nourished.  HENT:  Right Ear: External ear normal.  Left Ear: External ear normal.  Mouth/Throat: Oropharynx is clear and moist.  Eyes: Pupils are equal, round, and reactive to light.  Neck: Neck supple. No thyromegaly present.  Cardiovascular: Normal rate and regular rhythm.   Pulmonary/Chest: Effort normal and breath sounds normal. No respiratory distress. She has no wheezes. She has no rales.  Musculoskeletal: She exhibits no edema.  Neurological: She is alert and oriented to person, place, and time. No cranial nerve deficit.          Assessment & Plan:  #1 fatigue. Likely multifactorial. We'll encourage her to reduce sugar intake. Check lab work #2 hypothyroidism. Recheck TSH #3 history of hyperlipidemia. We reviewed recent labs with patient and these were at goal. Continue simvastatin. #4 history of TIA. We again reiterated importance of maintaining treatment with Plavix. #5 osteoarthritis mostly involving knees. Patient again requesting knee injection today. Discussed importance of not over injecting corticosteroids. We have strongly  recommended she engage in exercise such as cycling with lower resistance and she has access to this at her place of stay #6 health maintenance. Patient will get flu vaccine at local pharmacy

## 2011-08-21 ENCOUNTER — Other Ambulatory Visit: Payer: Self-pay | Admitting: Family Medicine

## 2011-08-21 MED ORDER — LEVOTHYROXINE SODIUM 125 MCG PO TABS: 125.0000 ug | ORAL_TABLET | Freq: Every day | ORAL | Status: DC

## 2011-08-21 NOTE — Progress Notes (Signed)
Quick Note:  Pt informed on home VM, cell no longer in service, I also left a detailed message on daughter Kim's home VM ______

## 2011-08-23 ENCOUNTER — Ambulatory Visit (HOSPITAL_COMMUNITY): Admission: RE | Admit: 2011-08-23 | Payer: Medicare (Managed Care) | Source: Ambulatory Visit

## 2011-08-27 ENCOUNTER — Ambulatory Visit: Payer: No Typology Code available for payment source | Admitting: Family Medicine

## 2011-08-27 ENCOUNTER — Telehealth: Payer: Self-pay | Admitting: *Deleted

## 2011-08-27 NOTE — Telephone Encounter (Signed)
A message left on home VM as well as daughter Selena Batten cell phone re thyroid test and new dose of med sent to pt pharmacy.  I also noted pt is scheduled for OV tomorrow??

## 2011-08-27 NOTE — Telephone Encounter (Signed)
Pt requesting to speak to [place] re: lab results and changes of meds?

## 2011-08-28 ENCOUNTER — Encounter: Payer: Self-pay | Admitting: Family Medicine

## 2011-08-28 ENCOUNTER — Ambulatory Visit (INDEPENDENT_AMBULATORY_CARE_PROVIDER_SITE_OTHER): Payer: Medicare (Managed Care) | Admitting: Family Medicine

## 2011-08-28 DIAGNOSIS — E785 Hyperlipidemia, unspecified: Secondary | ICD-10-CM

## 2011-08-28 DIAGNOSIS — R5381 Other malaise: Secondary | ICD-10-CM

## 2011-08-28 DIAGNOSIS — E039 Hypothyroidism, unspecified: Secondary | ICD-10-CM

## 2011-08-28 DIAGNOSIS — R5383 Other fatigue: Secondary | ICD-10-CM

## 2011-08-28 NOTE — Progress Notes (Signed)
  Subjective:    Patient ID: [name], female    DOB: May 21, 1931, 77 y.o.   MRN: [id number]  HPI Elderly female with history of hypothyroidism, past history of TIA, depression, hyperlipidemia, osteoarthritis, and remote history of alcohol abuse. Seen for followup. Recent labs reviewed with patient. Thyroid over replaced. Reduced levothyroxin. Patient compliant with medications. Overall feels her mood is stable. Ongoing left knee pain with pending referral to orthopedist. We have injected twice with steroids without improvement. X-rays show minimal degenerative changes. She continues to complain of pervasive fatigue. No recent fall. No chest pains. No dyspnea.   Review of Systems  Constitutional: Positive for fatigue. Negative for fever, chills, appetite change and unexpected weight change.  Respiratory: Negative for cough and shortness of breath.   Cardiovascular: Negative for chest pain.  Gastrointestinal: Negative for abdominal pain.  Genitourinary: Negative for dysuria.  Neurological: Negative for dizziness, syncope and headaches.  Psychiatric/Behavioral: Negative for dysphoric mood.       Objective:   Physical Exam  Constitutional: She appears well-developed and well-nourished. No distress.  HENT:  Right Ear: External ear normal.  Left Ear: External ear normal.  Mouth/Throat: Oropharynx is clear and moist.  Neck: Neck supple.  Cardiovascular: Normal rate, regular rhythm and normal heart sounds.   Pulmonary/Chest: Effort normal and breath sounds normal. No respiratory distress. She has no wheezes. She has no rales.          Assessment & Plan:  #1 hypothyroidism. Slightly over replaced. Reviewed appropriate medication with patient. Repeat TSH in 3 months #2 pervasive complaints of fatigue. Lab work reviewed with patient. Discussed importance of regular exercise #3 health maintenance. Flu vaccine recommended. Patient refuses today and states she'll get at later date

## 2011-09-11 ENCOUNTER — Telehealth: Payer: Self-pay | Admitting: *Deleted

## 2011-09-11 NOTE — Telephone Encounter (Signed)
She must stay on new dose thyroid med until follow up in 3 months to reassess thyroid.

## 2011-09-11 NOTE — Telephone Encounter (Signed)
Patient c/o excessive fatigue & tiredness since having thyroid medication changed [after 08/17/11 labs] and would like to know MD recommendation.

## 2011-09-12 NOTE — Telephone Encounter (Signed)
Patient informed. 

## 2011-09-26 ENCOUNTER — Telehealth: Payer: Self-pay | Admitting: Family Medicine

## 2011-09-26 DIAGNOSIS — M25569 Pain in unspecified knee: Secondary | ICD-10-CM

## 2011-09-26 NOTE — Telephone Encounter (Signed)
This pt has already been referred to them several months ago.

## 2011-09-26 NOTE — Telephone Encounter (Signed)
Pt would like a referral to [facility] for knee pain

## 2011-09-27 NOTE — Telephone Encounter (Signed)
lmom for pt to call back

## 2011-10-02 NOTE — Telephone Encounter (Signed)
Pt stated she did not keep ov. [facility] needs another referral

## 2011-10-16 ENCOUNTER — Telehealth: Payer: Self-pay

## 2011-10-16 NOTE — Telephone Encounter (Signed)
Pt informed on home VM that referral has been made from this end to [facility].  They should be contacting her re: appt.  Perhaps the holiday last week has slowed down they process of scheduling appts.

## 2011-10-16 NOTE — Telephone Encounter (Signed)
Pt called and stated she was waiting to hear about the referral for her knee pain.  Pls advise.

## 2011-10-19 ENCOUNTER — Telehealth: Payer: Self-pay | Admitting: *Deleted

## 2011-10-19 NOTE — Telephone Encounter (Signed)
I spoke with our scheduling coordinator Terri.  She called [facility].  The pt had been called to inform her the doctor she was initially scheduled with is no longer doing knee surgeries, so they were going to change her appt to see Dr Devonne Doughty instead.  Pt mis-understood.  Ginette Otto will call pt to explain again the plan.  They did not refuse to see her.

## 2011-10-19 NOTE — Telephone Encounter (Signed)
Patient is still having knee pain.  She was referred but the "refused to see her".  Any suggestions?

## 2011-11-14 ENCOUNTER — Ambulatory Visit (INDEPENDENT_AMBULATORY_CARE_PROVIDER_SITE_OTHER): Payer: No Typology Code available for payment source | Admitting: Family Medicine

## 2011-11-14 ENCOUNTER — Encounter: Payer: Self-pay | Admitting: Family Medicine

## 2011-11-14 DIAGNOSIS — R5383 Other fatigue: Secondary | ICD-10-CM

## 2011-11-14 DIAGNOSIS — E039 Hypothyroidism, unspecified: Secondary | ICD-10-CM

## 2011-11-14 LAB — COMPREHENSIVE METABOLIC PANEL
AST: 21 U/L (ref 0–37)
Albumin: 3.7 g/dL (ref 3.5–5.2)
Alkaline Phosphatase: 71 U/L (ref 39–117)
BUN: 17 mg/dL (ref 6–23)
Creatinine, Ser: 0.6 mg/dL (ref 0.4–1.2)
Glucose, Bld: 95 mg/dL (ref 70–99)
Potassium: 4.3 mEq/L (ref 3.5–5.1)

## 2011-11-14 LAB — TSH: TSH: 0.28 u[IU]/mL — ABNORMAL LOW (ref 0.35–5.50)

## 2011-11-14 NOTE — Progress Notes (Signed)
Subjective:    Patient ID: [name], female    DOB: 1931-03-25, 77 y.o.   MRN: [id number]  HPI 77 year old white female in with complaints of fatigue, dry and, being more cold lately. She has a history of hypothyroidism. She was here in September was found to be overtreated, Dr. [name] decreased her Synthroid from Hytrin 37.5 mics to 125 mics per day. Since then she has been feeling bad.    Review of Systems  Constitutional: Positive for fatigue.  HENT: Negative.   Respiratory: Negative.   Cardiovascular: Negative.   Gastrointestinal: Negative.   Genitourinary: Negative.   Skin:       Dry skin  Neurological: Negative.        Past Medical History  Diagnosis Date  . Alcohol abuse   . Arthritis   . Depression   . Stroke     History   Social History  . Marital Status: Married    Spouse Name: N/A    Number of Children: N/A  . Years of Education: N/A   Occupational History  . Not on file.   Social History Main Topics  . Smoking status: Never Smoker   . Smokeless tobacco: Not on file  . Alcohol Use: Not on file  . Drug Use: Not on file  . Sexually Active: Not on file   Other Topics Concern  . Not on file   Social History Narrative  . No narrative on file    Past Surgical History  Procedure Date  . Cholecystectomy 2008  . Tonsillectomy and adenoidectomy   . Abdominal hysterectomy     partial  . Thyroid surgery     Family History  Problem Relation Age of Onset  . Arthritis Mother   . Hyperlipidemia Mother   . Stroke Mother   . Hypertension Mother   . Mental illness Mother   . Cancer Father     lung  . Cancer Sister     ovarian/uterine  . Stroke Sister   . Hypertension Sister   . Hypertension Brother   . Diabetes Brother   . Cancer Daughter     breast    Allergies  Allergen Reactions  . Celexa     Shaky inside  . Sulfa Antibiotics     rash    Current Outpatient Prescriptions on File Prior to Visit  Medication Sig Dispense Refill    . clopidogrel (PLAVIX) 75 MG tablet Take 1 tablet (75 mg total) by mouth daily.  30 tablet  11  . folic acid (FOLVITE) 1 MG tablet Take 1 mg by mouth daily.        Marland Kitchen levothyroxine (SYNTHROID) 125 MCG tablet Take 1 tablet (125 mcg total) by mouth daily.  30 tablet  6  . Multiple Vitamins-Minerals (WOMENS 50+ MULTI VITAMIN/MIN) TABS Take by mouth daily.        . sertraline (ZOLOFT) 25 MG tablet Take 25 mg by mouth daily.       . simvastatin (ZOCOR) 20 MG tablet Take 1 tablet (20 mg total) by mouth at bedtime.  90 tablet  2  . thiamine 100 MG tablet Take 100 mg by mouth daily.        . traMADol (ULTRAM) 50 MG tablet Take 50 mg by mouth every 6 (six) hours as needed.         BP 138/80  Pulse 87  Temp(Src) 97.6 F (36.4 C) (Oral)  Wt 123 lb (55.792 kg)chart Objective:   Physical Exam  Constitutional:  She is oriented to person, place, and time. She appears well-developed and well-nourished.  HENT:  Right Ear: External ear normal.  Left Ear: External ear normal.  Mouth/Throat: Oropharynx is clear and moist.  Neck: Normal range of motion. Neck supple.  Cardiovascular: Normal rate and regular rhythm.   Pulmonary/Chest: Effort normal and breath sounds normal.  Abdominal: Soft. Bowel sounds are normal.  Musculoskeletal: Normal range of motion.  Neurological: She is alert and oriented to person, place, and time.  Skin: Skin is warm and dry.  Psychiatric: She has a normal mood and affect.          Assessment & Plan:  Assessment: Hypothyroidism, fatigue  Plan: TSH and CMP sent'll make adjustments accordingly and as necessary.

## 2011-11-14 NOTE — Patient Instructions (Signed)

## 2011-11-14 NOTE — Progress Notes (Signed)
Subjective:    Patient ID: [name], female    DOB: 07/10/1931, 77 y.o.   MRN: [id number]  HPI 77 year old black female infemale, nonsmoker, and with complaints headache, sore throat, cough, congestion x 2 days, and worsening. She's had similar episodes in October and November that have responded well to prednisone. However, she is diabetic and has had great difficulty controlling her blood sugars since she has been on these therapies.She denies any recent changes environmentally.   Review of Systems  Constitutional: Negative.   HENT: Positive for congestion, sneezing and postnasal drip.   Eyes: Negative.   Respiratory: Positive for cough.   Cardiovascular: Negative.   Gastrointestinal: Negative.   Genitourinary: Negative.   Musculoskeletal: Negative.   Neurological: Negative.   Hematological: Negative.        Objective:   Physical Exam  Constitutional: She is oriented to person, place, and time. She appears well-developed and well-nourished.  HENT:  Right Ear: External ear normal.  Nose: Nose normal.  Mouth/Throat: Oropharynx is clear and moist.  Neck: Normal range of motion. Neck supple.  Cardiovascular: Normal rate, regular rhythm and normal heart sounds.   Pulmonary/Chest: Effort normal and breath sounds normal.  Neurological: She is alert and oriented to person, place, and time.  Skin: Skin is warm and dry.  Psychiatric: She has a normal mood and affect.          Assessment & Plan:  Assessment: Allergic rhinitis, uncontrolled  Plan. Referred to as an allergy specialist. Zyrtec 10 mg once a day. Depo-Medrol 80 mg IM x1 given. Patient to call symptoms worsen or persist. Recheck as scheduled and when necessary. Past Medical History  Diagnosis Date  . Alcohol abuse   . Arthritis   . Depression   . Stroke     History   Social History  . Marital Status: Married    Spouse Name: N/A    Number of Children: N/A  . Years of Education: N/A   Occupational  History  . Not on file.   Social History Main Topics  . Smoking status: Never Smoker   . Smokeless tobacco: Not on file  . Alcohol Use: Not on file  . Drug Use: Not on file  . Sexually Active: Not on file   Other Topics Concern  . Not on file   Social History Narrative  . No narrative on file    Past Surgical History  Procedure Date  . Cholecystectomy 2008  . Tonsillectomy and adenoidectomy   . Abdominal hysterectomy     partial  . Thyroid surgery     Family History  Problem Relation Age of Onset  . Arthritis Mother   . Hyperlipidemia Mother   . Stroke Mother   . Hypertension Mother   . Mental illness Mother   . Cancer Father     lung  . Cancer Sister     ovarian/uterine  . Stroke Sister   . Hypertension Sister   . Hypertension Brother   . Diabetes Brother   . Cancer Daughter     breast    Allergies  Allergen Reactions  . Celexa     Shaky inside  . Sulfa Antibiotics     rash    Current Outpatient Prescriptions on File Prior to Visit  Medication Sig Dispense Refill  . clopidogrel (PLAVIX) 75 MG tablet Take 1 tablet (75 mg total) by mouth daily.  30 tablet  11  . folic acid (FOLVITE) 1 MG tablet Take 1 mg by  mouth daily.        Marland Kitchen levothyroxine (SYNTHROID) 125 MCG tablet Take 1 tablet (125 mcg total) by mouth daily.  30 tablet  6  . Multiple Vitamins-Minerals (WOMENS 50+ MULTI VITAMIN/MIN) TABS Take by mouth daily.        . sertraline (ZOLOFT) 25 MG tablet Take 25 mg by mouth daily.       . simvastatin (ZOCOR) 20 MG tablet Take 1 tablet (20 mg total) by mouth at bedtime.  90 tablet  2  . thiamine 100 MG tablet Take 100 mg by mouth daily.        . traMADol (ULTRAM) 50 MG tablet Take 50 mg by mouth every 6 (six) hours as needed.         BP 138/80  Pulse 87  Temp(Src) 97.6 F (36.4 C) (Oral)  Wt 123 lb (55.792 kg)chart

## 2011-11-16 ENCOUNTER — Telehealth: Payer: Self-pay

## 2011-11-16 MED ORDER — LEVOTHYROXINE SODIUM 112 MCG PO TABS: 112.0000 ug | ORAL_TABLET | Freq: Every day | ORAL | Status: DC

## 2011-11-16 NOTE — Telephone Encounter (Signed)
Pt's daughter, Selena Batten, is aware of lab results. Unable to reach pt. Daughter will be sure pt gets message and have her pick Rx up at pharmacy.

## 2011-11-16 NOTE — Telephone Encounter (Signed)
Message copied by Beverely Low on Fri Nov 16, 2011  1:42 PM ------      Message from: Adline Mango B      Created: Thu Nov 15, 2011  4:43 PM       Her thyroid gland is still over treated. TSH .28. She will need to decrease Synthroid to 1 po QD. Recheck in 4-6 weeks.

## 2011-12-17 ENCOUNTER — Ambulatory Visit: Payer: No Typology Code available for payment source | Admitting: Family Medicine

## 2011-12-17 ENCOUNTER — Ambulatory Visit: Payer: Medicare (Managed Care) | Admitting: Family Medicine

## 2011-12-19 ENCOUNTER — Encounter: Payer: No Typology Code available for payment source | Admitting: Family Medicine

## 2011-12-19 ENCOUNTER — Encounter: Payer: Self-pay | Admitting: Family Medicine

## 2011-12-19 ENCOUNTER — Other Ambulatory Visit: Payer: Self-pay | Admitting: Family Medicine

## 2011-12-19 ENCOUNTER — Ambulatory Visit (INDEPENDENT_AMBULATORY_CARE_PROVIDER_SITE_OTHER): Payer: No Typology Code available for payment source | Admitting: Family Medicine

## 2011-12-19 VITALS — BP 130/62 | Temp 98.3°F | Wt 128.0 lb

## 2011-12-19 DIAGNOSIS — M1710 Unilateral primary osteoarthritis, unspecified knee: Secondary | ICD-10-CM

## 2011-12-19 DIAGNOSIS — G459 Transient cerebral ischemic attack, unspecified: Secondary | ICD-10-CM

## 2011-12-19 DIAGNOSIS — E785 Hyperlipidemia, unspecified: Secondary | ICD-10-CM

## 2011-12-19 DIAGNOSIS — E039 Hypothyroidism, unspecified: Secondary | ICD-10-CM

## 2011-12-19 NOTE — Progress Notes (Signed)
  Subjective:    Patient ID: [name], female    DOB: [birth year], 78 y.o.   MRN: [id number]  HPI  Patient for medical followup. Hypothyroidism. Recently over replaced thyroid with adjustment made. She had dose reduced about 4 weeks now. She states she is compliant with medications. She is somewhat of a poor historian. She had some persistent problems with osteoarthritis of the knees. Recently placed on meloxicam 7.5 mg daily. Knee pains are stable. No recent fall.  History of TIA.  On Plavix 75 mg daily but is convinced this is making her fatigued. She has tried skipping dose couple times is felt less fatigued. Is asking to go back on aspirin.  Past Medical History  Diagnosis Date  . Alcohol abuse   . Arthritis   . Depression   . Stroke    Past Surgical History  Procedure Date  . Cholecystectomy 2008  . Tonsillectomy and adenoidectomy   . Abdominal hysterectomy     partial  . Thyroid surgery     reports that she has never smoked. She does not have any smokeless tobacco history on file. Her alcohol and drug histories not on file. family history includes Arthritis in her mother; Cancer in her daughter, father, and sister; Diabetes in her brother; Hyperlipidemia in her mother; Hypertension in her brother, mother, and sister; Mental illness in her mother; and Stroke in her mother and sister. Allergies  Allergen Reactions  . Celexa     Shaky inside  . Sulfa Antibiotics     rash      Review of Systems  Constitutional: Positive for fatigue. Negative for fever, activity change, appetite change and unexpected weight change.  HENT: Negative for hearing loss, sore throat and trouble swallowing.   Respiratory: Negative for cough, shortness of breath and wheezing.   Cardiovascular: Negative for chest pain, palpitations and leg swelling.  Gastrointestinal: Negative for nausea, vomiting, abdominal pain, blood in stool and abdominal distention.  Genitourinary: Negative for dysuria and  hematuria.  Musculoskeletal: Positive for arthralgias. Negative for myalgias and gait problem.  Skin: Negative for rash.  Neurological: Negative for dizziness, syncope, weakness and headaches.  Hematological: Negative for adenopathy.  Psychiatric/Behavioral: Negative for dysphoric mood.       Objective:   Physical Exam  Constitutional: She appears well-developed and well-nourished.  HENT:  Mouth/Throat: Oropharynx is clear and moist.  Neck: Neck supple. No thyromegaly present.       Scar from previous partial thyroidectomy  Cardiovascular: Normal rate, regular rhythm and normal heart sounds.   Pulmonary/Chest: Effort normal and breath sounds normal. No respiratory distress. She has no wheezes. She has no rales.  Musculoskeletal: She exhibits no edema.  Lymphadenopathy:    She has no cervical adenopathy.  Neurological: She is alert. No cranial nerve deficit.          Assessment & Plan:  #1 hypothyroidism. We spent quite some time today discussing our rationale for changing her thyroid medication dose. She is convinced this (dose reduction) is making her more fatigued. Talked about adverse effects of over replacement of thyroid. Followup in one month and repeat TSH then. #2 fatigue. Probably multifactorial. Patient convinced this is related to Plavix.  She will try going back on aspirin 81 mg 2 daily  #3 hyperlipidemia. She has not been taking her simvastatin consistently. Reiterated importance of taking daily. Recheck lipids at followup

## 2011-12-19 NOTE — Telephone Encounter (Signed)
Pt called and said that she has an empty bottle of Synthroid,simvastatin (ZOCOR) 20 MG tablet. Pls call in to [facility] on [address].

## 2011-12-19 NOTE — Telephone Encounter (Signed)
I spoke with pharmacist, pt has 1-2 refills on both meds.  Pharmacy will fill and call pt

## 2011-12-20 ENCOUNTER — Telehealth: Payer: Self-pay | Admitting: *Deleted

## 2011-12-20 NOTE — Telephone Encounter (Signed)
Pt is calling to ask if her meds have been sent to her pharmacy ???  She was here yesterday.  She left a message on Nancy's phone also.

## 2011-12-20 NOTE — Telephone Encounter (Signed)
Pt was informed this was done yesterday

## 2012-01-15 ENCOUNTER — Ambulatory Visit: Payer: Medicare (Managed Care) | Admitting: Family Medicine

## 2012-01-16 ENCOUNTER — Ambulatory Visit (INDEPENDENT_AMBULATORY_CARE_PROVIDER_SITE_OTHER): Payer: No Typology Code available for payment source | Admitting: Family Medicine

## 2012-01-16 ENCOUNTER — Encounter: Payer: Self-pay | Admitting: Family Medicine

## 2012-01-16 DIAGNOSIS — E785 Hyperlipidemia, unspecified: Secondary | ICD-10-CM

## 2012-01-16 DIAGNOSIS — M1710 Unilateral primary osteoarthritis, unspecified knee: Secondary | ICD-10-CM

## 2012-01-16 DIAGNOSIS — E039 Hypothyroidism, unspecified: Secondary | ICD-10-CM

## 2012-01-16 LAB — HEPATIC FUNCTION PANEL
ALT: 19 U/L (ref 0–35)
Albumin: 4 g/dL (ref 3.5–5.2)
Alkaline Phosphatase: 73 U/L (ref 39–117)
Bilirubin, Direct: 0 mg/dL (ref 0.0–0.3)
Total Protein: 7 g/dL (ref 6.0–8.3)

## 2012-01-16 LAB — LIPID PANEL
Cholesterol: 204 mg/dL — ABNORMAL HIGH (ref 0–200)
HDL: 73.9 mg/dL (ref >39.00)
VLDL: 17 mg/dL (ref 0.0–40.0)

## 2012-01-16 LAB — TSH: TSH: 1.26 u[IU]/mL (ref 0.35–5.50)

## 2012-01-16 MED ORDER — DICLOFENAC SODIUM 1 % TD GEL: 1.0000 "application " | Freq: Four times a day (QID) | TRANSDERMAL | Status: DC

## 2012-01-16 NOTE — Progress Notes (Signed)
  Subjective:    Patient ID: [name], female    DOB: 01-17-1931, 78 y.o.   MRN: [id number]  HPI  Followup multiple medical problems. She has history of TIA, hypothyroidism, hyperlipidemia, osteoarthritis of the knees and past history of alcohol abuse. Thyroid is bit over replaced and we modify dosage in December. Needs followup. History of TIA approximately one year ago. She recently discontinued Plavix went back on aspirin. Taking Zocor 20 mg daily. She is convinced this makes her feel fatigued but denies any muscle aches.  Ongoing problems with left knee arthritis. Previous steroid injections both here and orthopedist without much improvement. She has never tried topical NSAIDs  Past Medical History  Diagnosis Date  . Alcohol abuse   . Arthritis   . Depression   . Stroke    Past Surgical History  Procedure Date  . Cholecystectomy 2008  . Tonsillectomy and adenoidectomy   . Abdominal hysterectomy     partial  . Thyroid surgery     reports that she has never smoked. She does not have any smokeless tobacco history on file. Her alcohol and drug histories not on file. family history includes Arthritis in her mother; Cancer in her daughter, father, and sister; Diabetes in her brother; Hyperlipidemia in her mother; Hypertension in her brother, mother, and sister; Mental illness in her mother; and Stroke in her mother and sister. Allergies  Allergen Reactions  . Celexa     Shaky inside  . Sulfa Antibiotics     rash     Review of Systems  Constitutional: Negative for fever and chills.  HENT: Negative for trouble swallowing.   Respiratory: Negative for cough and shortness of breath.   Cardiovascular: Negative for chest pain, palpitations and leg swelling.  Gastrointestinal: Negative for abdominal pain.  Genitourinary: Negative for dysuria.  Musculoskeletal: Positive for arthralgias.  Neurological: Negative for dizziness.       Objective:   Physical Exam  Constitutional:  She appears well-developed and well-nourished.  HENT:  Mouth/Throat: Oropharynx is clear and moist.  Neck: Neck supple. No thyromegaly present.  Cardiovascular: Normal rate and regular rhythm.   Pulmonary/Chest: Effort normal and breath sounds normal. No respiratory distress. She has no wheezes. She has no rales.  Musculoskeletal:       Left knee full range of motion. No effusion. Minimal crepitus. No warmth or erythema.  Lymphadenopathy:    She has no cervical adenopathy.          Assessment & Plan:  #1 hypothyroidism. Recheck TSH #2 hyperlipidemia. Goal LDL less than 100. Recheck lipids and hepatic panel  #3 osteoarthritis left knee.  Voltaren gel with instructions for use. Reassess in 3 months

## 2012-01-17 NOTE — Progress Notes (Signed)
Quick Note:  Pt informed on home VM ______ 

## 2012-01-21 ENCOUNTER — Telehealth: Payer: Self-pay | Admitting: Family Medicine

## 2012-01-21 MED ORDER — LEVOTHYROXINE SODIUM 112 MCG PO TABS: 112.0000 ug | ORAL_TABLET | Freq: Every day | ORAL | Status: DC

## 2012-01-21 NOTE — Telephone Encounter (Signed)
Think [name] is confused enough that she needs someone to help her administer her medications

## 2012-01-21 NOTE — Telephone Encounter (Signed)
I did call [name] and we spoke a length.  Documentation in another phone note, same date

## 2012-01-21 NOTE — Telephone Encounter (Signed)
Pt called and said that she is extremely fatigued. Believes that she needs to get referral to specialist re: thyroid. Pt also wants to have additional labs to test thinness of blood.  Pt says that it is in family hx. Pt req call back from nurse asap. Also said that she needs refill of levothyroxine (SYNTHROID, LEVOTHROID) 112 MCG tablet .

## 2012-01-21 NOTE — Telephone Encounter (Signed)
Patient's daughter would like to speak to the nurse about changes that she is noticing with her mom. Please call back.

## 2012-01-21 NOTE — Telephone Encounter (Signed)
Pt daughter Selena Batten also called today (another phone note) and she visited her mother yesterday and she "could hardly get around, eyes looked glassed over, C/O sleepiness".  Her mother told her Dr [name] had taken her off Plavix and told her to take 2 baby aspirin.  Plavis is still on her med list she took home after her visit on 2/27.  Daughter wondering if she is still taking plavix as well as 2 asa now?  I asked Selena Batten if she could come with her mother with all meds for her next 3 month OV.  Selena Batten is a Runner, broadcasting/film/video and can come later in the afternoon.  After meds are clarified, I told Selena Batten I would mail a copy (she has medical POA)

## 2012-01-21 NOTE — Telephone Encounter (Signed)
Thyroid Ok  No indication to see endocrine specialist regarding thyroid.  I would like for her to try to increase exercise levels to see if this helps her fatigue.  Previous B12 levels OK.

## 2012-01-21 NOTE — Telephone Encounter (Signed)
Dr [name] reports pt requested being taken off Plavix.  She is to take ASA 81 mg once daily.  Copy of meds sent to daughter [name] with instructions for 3 month F/U visit when daughter can come with her.  Pt also needs supervision on her meds

## 2012-01-21 NOTE — Telephone Encounter (Signed)
I have not called her back yet as she has chronic complaints of fatigue when I am with her. Thyroid filled Please advise re: labs and referral

## 2012-03-04 NOTE — Progress Notes (Signed)
This encounter was created in error - please disregard.

## 2012-03-20 ENCOUNTER — Ambulatory Visit: Payer: No Typology Code available for payment source | Admitting: Family Medicine

## 2012-03-25 ENCOUNTER — Ambulatory Visit: Payer: Self-pay | Admitting: Family Medicine

## 2012-03-25 ENCOUNTER — Telehealth: Payer: Self-pay | Admitting: *Deleted

## 2012-03-25 NOTE — Telephone Encounter (Signed)
Pt was no show for appt today 5/7, pt scheduled appt on 5/2.  This is her 2nd no show at our office.  Per Dr [name], please call daughter and see if she or someone can assist with pt appt. dates and times and come to appt with pt.  I left 2 messages with daughter Candee Furbish at [phone].  Perhaps [facility] might be an option as a visit to pt home to assess ADL's.

## 2012-04-02 ENCOUNTER — Ambulatory Visit (INDEPENDENT_AMBULATORY_CARE_PROVIDER_SITE_OTHER): Payer: Medicare Other | Admitting: Family Medicine

## 2012-04-02 ENCOUNTER — Encounter: Payer: Self-pay | Admitting: Family Medicine

## 2012-04-02 VITALS — BP 120/78 | Temp 98.0°F | Wt 126.0 lb

## 2012-04-02 DIAGNOSIS — R4189 Other symptoms and signs involving cognitive functions and awareness: Secondary | ICD-10-CM

## 2012-04-02 DIAGNOSIS — E039 Hypothyroidism, unspecified: Secondary | ICD-10-CM

## 2012-04-02 DIAGNOSIS — F09 Unspecified mental disorder due to known physiological condition: Secondary | ICD-10-CM

## 2012-04-02 MED ORDER — MEMANTINE HCL 5 MG PO TABS: 5.0000 mg | ORAL_TABLET | Freq: Two times a day (BID) | ORAL | Status: DC

## 2012-04-02 NOTE — Progress Notes (Signed)
  Subjective:    Patient ID: [name], female    DOB: [birth year], 78 y.o.   MRN: [id number]  HPI  Patient accompanied by daughter. Patient complaining increased fatigue which has been a chronic complaint. Previous lab work including B12 levels and thyroid functions recently were normal. She was over replaced with thyroid and we had been down grading her medication. Last TSH in February was normal. She complains to complain of fatigue. Apparently is not very active physically. Eating fairly well. Weight relatively stable. Past history of depression following loss of husband over one year ago. Patient stopped sertraline several months ago. No concern for recurrent depression.  Daughter has concerns about short-term memory loss. She is starting to repeat herself. She said prior history of TIA and previous MRI scan over one year ago revealed remote history of stroke but no acute findings. She's not had any recent acute changes in mental status. She tends to have negative thought pattern and low motivation.  No recent fall. No recent head injury.  Past Medical History  Diagnosis Date  . Alcohol abuse   . Arthritis   . Depression   . Stroke    Past Surgical History  Procedure Date  . Cholecystectomy 2008  . Tonsillectomy and adenoidectomy   . Abdominal hysterectomy     partial  . Thyroid surgery     reports that she has never smoked. She does not have any smokeless tobacco history on file. Her alcohol and drug histories not on file. family history includes Arthritis in her mother; Cancer in her daughter, father, and sister; Diabetes in her brother; Hyperlipidemia in her mother; Hypertension in her brother, mother, and sister; Mental illness in her mother; and Stroke in her mother and sister. Allergies  Allergen Reactions  . Citalopram Hydrobromide     Shaky inside  . Sulfa Antibiotics     rash      Review of Systems  Constitutional: Positive for fatigue. Negative for appetite change  and unexpected weight change.  Respiratory: Negative for cough and shortness of breath.   Cardiovascular: Negative for chest pain.  Gastrointestinal: Negative for nausea, vomiting and abdominal pain.  Genitourinary: Negative for dysuria.  Neurological: Negative for dizziness, syncope and headaches.  Hematological: Negative for adenopathy. Does not bruise/bleed easily.  Psychiatric/Behavioral: Negative for dysphoric mood and agitation.       Objective:   Physical Exam  Constitutional: She is oriented to person, place, and time. She appears well-developed and well-nourished.  HENT:  Right Ear: External ear normal.  Left Ear: External ear normal.  Neck: Neck supple. No thyromegaly present.  Cardiovascular: Normal rate and regular rhythm.   Pulmonary/Chest: Effort normal and breath sounds normal. No respiratory distress. She has no wheezes. She has no rales.  Musculoskeletal: She exhibits no edema.  Neurological: She is alert and oriented to person, place, and time. No cranial nerve deficit.  Psychiatric: She has a normal mood and affect. Her behavior is normal.       Mini-Mental Status exam 20/30.          Assessment & Plan:  Cognitive impairment. Probable dementia. Mini-Mental Status exam 20/30. Doubt related to thyroid with recent levels okay. Consider Namenda 5 mg twice a day and reassess 3-4 weeks. Recheck TSH with history of hypothyroidism. Previous B12 levels within normal. She has history of several years of alcohol abuse and may have some organic brain syndrome related to that.  She has taken both thiamine and multivitamin.

## 2012-04-03 LAB — TSH: TSH: 0.73 u[IU]/mL (ref 0.35–5.50)

## 2012-04-04 NOTE — Progress Notes (Signed)
Quick Note:  Pt daughter Kim informed ______ 

## 2012-04-07 ENCOUNTER — Telehealth: Payer: Self-pay | Admitting: Family Medicine

## 2012-04-07 MED ORDER — MEMANTINE HCL 5 MG PO TABS: 5.0000 mg | ORAL_TABLET | Freq: Two times a day (BID) | ORAL | Status: DC

## 2012-04-07 NOTE — Telephone Encounter (Signed)
Start Namenda 5 mg po bid and follow up in one month to reassess.

## 2012-04-07 NOTE — Telephone Encounter (Signed)
Daughter informed

## 2012-04-07 NOTE — Telephone Encounter (Signed)
Pts daughter called and said that since pts thyroid test came back normal, pls put pt on med for dementia, as previously discussed with Dr [name]. Pt uses [facility] on [address] on [address].

## 2012-04-07 NOTE — Telephone Encounter (Signed)
Please advise 

## 2012-04-08 ENCOUNTER — Other Ambulatory Visit: Payer: Self-pay | Admitting: Family Medicine

## 2012-04-30 ENCOUNTER — Encounter: Payer: Self-pay | Admitting: Family Medicine

## 2012-04-30 ENCOUNTER — Ambulatory Visit (INDEPENDENT_AMBULATORY_CARE_PROVIDER_SITE_OTHER): Payer: Medicare Other | Admitting: Family Medicine

## 2012-04-30 VITALS — BP 140/62 | Temp 97.9°F | Wt 120.0 lb

## 2012-04-30 DIAGNOSIS — R300 Dysuria: Secondary | ICD-10-CM

## 2012-04-30 DIAGNOSIS — J019 Acute sinusitis, unspecified: Secondary | ICD-10-CM

## 2012-04-30 DIAGNOSIS — E785 Hyperlipidemia, unspecified: Secondary | ICD-10-CM

## 2012-04-30 DIAGNOSIS — F09 Unspecified mental disorder due to known physiological condition: Secondary | ICD-10-CM

## 2012-04-30 DIAGNOSIS — R4189 Other symptoms and signs involving cognitive functions and awareness: Secondary | ICD-10-CM

## 2012-04-30 LAB — POCT URINALYSIS DIPSTICK
Bilirubin, UA: NEGATIVE
Ketones, UA: NEGATIVE
Spec Grav, UA: 1.025

## 2012-04-30 MED ORDER — PRAVASTATIN SODIUM 20 MG PO TABS: 20.0000 mg | ORAL_TABLET | Freq: Every day | ORAL | Status: DC

## 2012-04-30 MED ORDER — LEVOFLOXACIN 500 MG PO TABS: 500.0000 mg | ORAL_TABLET | Freq: Every day | ORAL | Status: DC

## 2012-04-30 NOTE — Patient Instructions (Addendum)
Stop Simvastatin Start Pravastatin 20 mg once daily Hold Namenda for now.  Urinary Tract Infection Infections of the urinary tract can start in several places. A bladder infection (cystitis), a kidney infection (pyelonephritis), and a prostate infection (prostatitis) are different types of urinary tract infections (UTIs). They usually get better if treated with medicines (antibiotics) that kill germs. Take all the medicine until it is gone. You or your child may feel better in a few days, but TAKE ALL MEDICINE or the infection may not respond and may become more difficult to treat. HOME CARE INSTRUCTIONS   Drink enough water and fluids to keep the urine clear or pale yellow. Cranberry juice is especially recommended, in addition to large amounts of water.   Avoid caffeine, tea, and carbonated beverages. They tend to irritate the bladder.   Alcohol may irritate the prostate.   Only take over-the-counter or prescription medicines for pain, discomfort, or fever as directed by your caregiver.  To prevent further infections:  Empty the bladder often. Avoid holding urine for long periods of time.   After a bowel movement, women should cleanse from front to back. Use each tissue only once.   Empty the bladder before and after sexual intercourse.  FINDING OUT THE RESULTS OF YOUR TEST Not all test results are available during your visit. If your or your child's test results are not back during the visit, make an appointment with your caregiver to find out the results. Do not assume everything is normal if you have not heard from your caregiver or the medical facility. It is important for you to follow up on all test results. SEEK MEDICAL CARE IF:   There is back pain.   Your baby is older than 3 months with a rectal temperature of 100.5 F (38.1 C) or higher for more than 1 day.   Your or your child's problems (symptoms) are no better in 3 days. Return sooner if you or your child is getting  worse.  SEEK IMMEDIATE MEDICAL CARE IF:   There is severe back pain or lower abdominal pain.   You or your child develops chills.   You have a fever.   Your baby is older than 3 months with a rectal temperature of 102 F (38.9 C) or higher.   Your baby is 12 months old or younger with a rectal temperature of 100.4 F (38 C) or higher.   There is nausea or vomiting.   There is continued burning or discomfort with urination.  MAKE SURE YOU:   Understand these instructions.   Will watch your condition.   Will get help right away if you are not doing well or get worse.  Document Released: 08/15/2005 Document Revised: 10/25/2011 Document Reviewed: 03/20/2007 [facility] Patient Information 2012 [place], [place].

## 2012-04-30 NOTE — Progress Notes (Signed)
  Subjective:    Patient ID: [name], female    DOB: 1931-08-01, 78 y.o.   MRN: [id number]  HPI  Patient has chronic problems including history of TIA, hyperlipidemia, remote history of alcohol abuse cognitive impairment and hypothyroidism. Seen today for followup. Daughter thinks she has been slightly more confused over the past couple of weeks. She's not taking Namenda regularly and only 5 mg once daily at that. She has concerns for possible sinus infection. Right facial pain frontal and maxillary with some yellowish discharge occasionally. No documented fever. No cough. Occasional lightheadedness.  She complains of some chronic myalgias. Family concerned this may be related to her simvastatin. They're not aware of her trying other statins.  Patient also complaining of some urine frequency and burning with urination. No documented fever. No chills. No back pain. No nausea or vomiting.  Past Medical History  Diagnosis Date  . Alcohol abuse   . Arthritis   . Depression   . Stroke    Past Surgical History  Procedure Date  . Cholecystectomy 2008  . Tonsillectomy and adenoidectomy   . Abdominal hysterectomy     partial  . Thyroid surgery     reports that she has never smoked. She does not have any smokeless tobacco history on file. Her alcohol and drug histories not on file. family history includes Arthritis in her mother; Cancer in her daughter, father, and sister; Diabetes in her brother; Hyperlipidemia in her mother; Hypertension in her brother, mother, and sister; Mental illness in her mother; and Stroke in her mother and sister. Allergies  Allergen Reactions  . Citalopram Hydrobromide     Shaky inside  . Sulfa Antibiotics     rash     Review of Systems  Constitutional: Positive for fatigue. Negative for fever and chills.  HENT: Positive for congestion. Negative for sore throat and neck stiffness.   Respiratory: Negative for cough and shortness of breath.     Gastrointestinal: Negative for nausea, vomiting and abdominal pain.  Genitourinary: Positive for dysuria. Negative for hematuria.  Neurological: Positive for headaches.       Objective:   Physical Exam  Constitutional: She is oriented to person, place, and time. She appears well-developed and well-nourished.  HENT:  Right Ear: External ear normal.  Left Ear: External ear normal.  Mouth/Throat: Oropharynx is clear and moist.       Thick yellow crusted mucus both naris  Eyes: Pupils are equal, round, and reactive to light.  Neck: Neck supple.  Cardiovascular: Normal rate and regular rhythm.   Pulmonary/Chest: Effort normal and breath sounds normal. No respiratory distress. She has no wheezes. She has no rales.  Musculoskeletal: She exhibits no edema.  Neurological: She is alert and oriented to person, place, and time. No cranial nerve deficit.          Assessment & Plan:  #1 probable acute sinusitis #2 myalgias possibly related to simvastatin  #3 cognitive impairment #4 dysuria-suspected UTI by urine dip  Urine cx sent.  Levaquin to cover urine and sinuses.  Stop Simvastatin and start Pravachol 20 mg daily and reassess one month.  Hold Namenda for now.

## 2012-05-03 LAB — URINE CULTURE: Colony Count: >=100000

## 2012-05-05 NOTE — Progress Notes (Signed)
Quick Note:  Pt daughter informed on her VM ______

## 2012-05-09 ENCOUNTER — Ambulatory Visit (INDEPENDENT_AMBULATORY_CARE_PROVIDER_SITE_OTHER): Payer: Medicare Other | Admitting: Family Medicine

## 2012-05-09 ENCOUNTER — Encounter: Payer: Self-pay | Admitting: Family Medicine

## 2012-05-09 VITALS — BP 130/60 | Temp 97.6°F | Wt 122.0 lb

## 2012-05-09 DIAGNOSIS — R300 Dysuria: Secondary | ICD-10-CM

## 2012-05-09 LAB — POCT URINALYSIS DIPSTICK
Bilirubin, UA: NEGATIVE
Glucose, UA: NEGATIVE
Ketones, UA: NEGATIVE

## 2012-05-09 MED ORDER — AMOXICILLIN 875 MG PO TABS: 875.0000 mg | ORAL_TABLET | Freq: Two times a day (BID) | ORAL | Status: AC

## 2012-05-09 NOTE — Progress Notes (Signed)
  Subjective:    Patient ID: [name], female    DOB: February 11, 1931, 78 y.o.   MRN: [id number]  HPI  Medical followup. Patient had recent issues with sinus congestion and dysuria. Urine culture grew out Escherichia coli. Patient treated with Levaquin and urinary symptoms did improve somewhat but she had some hallucinations which family thought may of been related to this. None since stopping medication. No fever or chills. Still has some sinus congestion symptoms. She's had some chronic mild urinary frequency but denies any burning with urination. She took antibiotic for about 5 days.  She has history of cognitive impairment and prior history of alcohol abuse. No alcohol use for over one year now.  Chronic myalgias. Previous sed rates normal. Patient was taken off prior statin has not yet started on pravastatin.  myalgias did improve after stopping simvastatin.  Past Medical History  Diagnosis Date  . Alcohol abuse   . Arthritis   . Depression   . Stroke    Past Surgical History  Procedure Date  . Cholecystectomy 2008  . Tonsillectomy and adenoidectomy   . Abdominal hysterectomy     partial  . Thyroid surgery     reports that she has never smoked. She does not have any smokeless tobacco history on file. Her alcohol and drug histories not on file. family history includes Arthritis in her mother; Cancer in her daughter, father, and sister; Diabetes in her brother; Hyperlipidemia in her mother; Hypertension in her brother, mother, and sister; Mental illness in her mother; and Stroke in her mother and sister. Allergies  Allergen Reactions  . Citalopram Hydrobromide     Shaky inside  . Sulfa Antibiotics     rash      Review of Systems  Constitutional: Negative for fever, chills, appetite change and unexpected weight change.  Respiratory: Negative for cough and shortness of breath.   Cardiovascular: Negative for chest pain.  Gastrointestinal: Negative for abdominal pain.    Genitourinary: Positive for dysuria and frequency. Negative for hematuria.       Objective:   Physical Exam  Constitutional: She is oriented to person, place, and time. She appears well-developed and well-nourished.  HENT:  Right Ear: External ear normal.  Left Ear: External ear normal.  Mouth/Throat: Oropharynx is clear and moist.  Neck: Neck supple. No thyromegaly present.  Cardiovascular: Normal rate and regular rhythm.   Pulmonary/Chest: Effort normal and breath sounds normal. No respiratory distress. She has no wheezes. She has no rales.  Musculoskeletal: She exhibits no edema.  Lymphadenopathy:    She has no cervical adenopathy.  Neurological: She is alert and oriented to person, place, and time.          Assessment & Plan:  #1 dysuria. Recent Escherichia coli UTI. Possible adverse reaction to Levaquin. Repeat urine culture today. She has somewhat equivocal urine dipstick with urine dipstick positive leukocyte esterase and trace blood. Cover with amoxicillin 875 mg twice a day as recent culture susceptible to ampicillin.  Patient had prior history of reported urethral stricture but denies any obstructive symptoms #2 hyperlipidemia in a patient with prior TIA. Go ahead and start pravastatin.

## 2012-05-11 LAB — URINE CULTURE: Colony Count: 75000

## 2012-06-04 ENCOUNTER — Encounter: Payer: Self-pay | Admitting: Family Medicine

## 2012-06-04 ENCOUNTER — Ambulatory Visit (INDEPENDENT_AMBULATORY_CARE_PROVIDER_SITE_OTHER): Payer: Medicare Other | Admitting: Family Medicine

## 2012-06-04 VITALS — BP 122/62 | Temp 97.8°F | Wt 122.0 lb

## 2012-06-04 DIAGNOSIS — R5383 Other fatigue: Secondary | ICD-10-CM

## 2012-06-04 DIAGNOSIS — Z299 Encounter for prophylactic measures, unspecified: Secondary | ICD-10-CM

## 2012-06-04 DIAGNOSIS — Z418 Encounter for other procedures for purposes other than remedying health state: Secondary | ICD-10-CM

## 2012-06-04 DIAGNOSIS — E785 Hyperlipidemia, unspecified: Secondary | ICD-10-CM

## 2012-06-04 DIAGNOSIS — G459 Transient cerebral ischemic attack, unspecified: Secondary | ICD-10-CM

## 2012-06-04 DIAGNOSIS — Z9181 History of falling: Secondary | ICD-10-CM

## 2012-06-04 DIAGNOSIS — R5381 Other malaise: Secondary | ICD-10-CM

## 2012-06-04 DIAGNOSIS — R300 Dysuria: Secondary | ICD-10-CM

## 2012-06-04 DIAGNOSIS — Z2989 Encounter for other specified prophylactic measures: Secondary | ICD-10-CM

## 2012-06-04 LAB — CBC WITH DIFFERENTIAL/PLATELET
Eosinophils Relative: 2.2 % (ref 0.0–5.0)
HCT: 37.4 % (ref 36.0–46.0)
Lymphs Abs: 1.5 10*3/uL (ref 0.7–4.0)
Monocytes Relative: 9.4 % (ref 3.0–12.0)
Neutrophils Relative %: 66.6 % (ref 43.0–77.0)
Platelets: 297 10*3/uL (ref 150.0–400.0)
WBC: 7.3 10*3/uL (ref 4.5–10.5)

## 2012-06-04 LAB — POCT URINALYSIS DIPSTICK
Glucose, UA: NEGATIVE
Ketones, UA: NEGATIVE
Protein, UA: NEGATIVE

## 2012-06-04 NOTE — Progress Notes (Signed)
  Subjective:    Patient ID: [name], female    DOB: 02/16/1931, 78 y.o.   MRN: [id number]  HPI  Patient history of mild hyperlipidemia, remote history of alcohol abuse, history of depression (currently stable), hypothyroidism, osteoarthritis, and history of TIA over one year ago. She's done relatively well recently. She's had some dysuria symptoms and recent UTI but no current urinary problems. She's had multiple somatic complaints including myalgias which did improve after stopping pravastatin. She is reluctant to take any statin at this point. She has taken red rice yeast extract in the past and requests going back on that.  She does complain of increased fatigue. Daughter relates that she had some type of abnormal polyp years ago but has not had a colonoscopy in estimated 10 years. We do not have those records. No bloody stools. Occasional constipation. No appetite or weight changes.  She has some osteoarthritis involving knees. Unstable with gait at times. Use walker all times. They would like to consider physical therapy for lower extremity strengthening.  Past Medical History  Diagnosis Date  . Alcohol abuse   . Arthritis   . Depression   . Stroke    Past Surgical History  Procedure Date  . Cholecystectomy 2008  . Tonsillectomy and adenoidectomy   . Abdominal hysterectomy     partial  . Thyroid surgery     reports that she has never smoked. She does not have any smokeless tobacco history on file. Her alcohol and drug histories not on file. family history includes Arthritis in her mother; Cancer in her daughter, father, and sister; Diabetes in her brother; Hyperlipidemia in her mother; Hypertension in her brother, mother, and sister; Mental illness in her mother; and Stroke in her mother and sister. Allergies  Allergen Reactions  . Citalopram Hydrobromide     Shaky inside  . Sulfa Antibiotics     rash      Review of Systems  Constitutional: Negative for appetite  change and unexpected weight change.  Respiratory: Negative for cough and shortness of breath.   Cardiovascular: Negative for chest pain, palpitations and leg swelling.  Gastrointestinal: Negative for nausea, vomiting, abdominal pain, diarrhea and blood in stool.  Genitourinary: Negative for dysuria.  Neurological: Negative for dizziness and headaches.       Objective:   Physical Exam  Constitutional: She is oriented to person, place, and time. She appears well-developed and well-nourished.  Neck: Neck supple. No thyromegaly present.  Cardiovascular: Normal rate and regular rhythm.   Pulmonary/Chest: Effort normal and breath sounds normal. No respiratory distress. She has no wheezes. She has no rales.  Musculoskeletal: She exhibits no edema.  Neurological: She is alert and oriented to person, place, and time.          Assessment & Plan:  #1 recent dysuria. Current urine dipstick shows only trace leukocytes otherwise clear. Observe for now  #2 history of hyperlipidemia. Intolerant of multiple statins. Follow off medications. She is reluctant to try other statins  #3 high risk for falls. Osteoarthritis involving knees. Set up physical therapy #4 increased fatigue. Question due to sedentary lifestyle. Recent TSH normal. Check CBC.  #5 history of reported previous abnormal colonoscopy. Question history of adenomatous polyp by history. Family to get old records. Setup repeat colonoscopy

## 2012-06-05 NOTE — Progress Notes (Signed)
Quick Note:  Pt daughter Selena Batten informed ______

## 2012-06-09 ENCOUNTER — Telehealth: Payer: Self-pay | Admitting: Family Medicine

## 2012-06-09 DIAGNOSIS — Z9181 History of falling: Secondary | ICD-10-CM

## 2012-06-09 NOTE — Telephone Encounter (Signed)
Pt was referred to pt at [facility] pt would like in home pt please send new order

## 2012-06-18 ENCOUNTER — Telehealth: Payer: Self-pay | Admitting: Family Medicine

## 2012-06-18 NOTE — Telephone Encounter (Signed)
Opened in error

## 2012-07-15 ENCOUNTER — Telehealth: Payer: Self-pay | Admitting: Family Medicine

## 2012-07-15 NOTE — Telephone Encounter (Signed)
Caller: [facility]; Patient Name: [name]; PCP: Evelena Peat [facility]); Best Callback Phone Number: [phone].  Therapist calling for extension of orders for continued physical therapy.  Would like another 4 weeks of home health physical therapy.  INfo to office for provider review/Rx/callback.

## 2012-07-15 NOTE — Telephone Encounter (Signed)
Renew for additional 4 weeks.

## 2012-07-16 NOTE — Telephone Encounter (Signed)
Verbal order given to Maurine Minister, PT to continue PT for an additional 4 weeks

## 2012-07-17 ENCOUNTER — Ambulatory Visit (INDEPENDENT_AMBULATORY_CARE_PROVIDER_SITE_OTHER): Payer: Medicare Other | Admitting: Family Medicine

## 2012-07-17 ENCOUNTER — Encounter: Payer: Self-pay | Admitting: Family Medicine

## 2012-07-17 VITALS — BP 140/68 | Temp 97.9°F | Wt 121.0 lb

## 2012-07-17 DIAGNOSIS — R5383 Other fatigue: Secondary | ICD-10-CM

## 2012-07-17 DIAGNOSIS — R300 Dysuria: Secondary | ICD-10-CM

## 2012-07-17 DIAGNOSIS — M549 Dorsalgia, unspecified: Secondary | ICD-10-CM

## 2012-07-17 LAB — POCT URINALYSIS DIPSTICK
Bilirubin, UA: NEGATIVE
Ketones, UA: NEGATIVE

## 2012-07-17 MED ORDER — CEPHALEXIN 500 MG PO CAPS: 500.0000 mg | ORAL_CAPSULE | Freq: Four times a day (QID) | ORAL | Status: AC

## 2012-07-17 MED ORDER — TRIMETHOPRIM 100 MG PO TABS: 100.0000 mg | ORAL_TABLET | Freq: Every day | ORAL | Status: AC

## 2012-07-17 NOTE — Progress Notes (Signed)
Subjective:    Patient ID: [name], female    DOB: 10/13/1931, 78 y.o.   MRN: [id number]  HPI  Patient has history of hypothyroidism, history of TIA, past history of alcohol abuse, cognitive impairment, osteoarthritis, hyperlipidemia. Accompanied by daughter today with several issues  Concern for possible recurrent UTI. Several UTIs during the past year. Increased fatigue. No complaints with burning with urination but she's had similar fatigue with UTI the past. No reported fever. Does have some low back pain.  Gradual decline in activity. Currently undergoing some physical therapy. No recent falls. Has seen orthopedist and reportedly severe arthritis knee and hips bilaterally. She has undergone some injections with limited improvement.  Daughter has concerns about her mom's low back pain. Patient poor historian secondary to cognitive impairment. Pain worse right lower lumbar. Occasional radiation down right lower extremity. Pain mostly with changing positions in somewhat with walking and less with rest. No urine incontinence. No reported numbness. Generalized weakness. No focal weakness. Pain is moderate. Using tramadol with some success. No appetite or weight changes. No fever. Duration of back pain is difficult to determine but apparently for at least a few weeks. No reported recent fall or back injury  Past Medical History  Diagnosis Date  . Alcohol abuse   . Arthritis   . Depression   . Stroke    Past Surgical History  Procedure Date  . Cholecystectomy 2008  . Tonsillectomy and adenoidectomy   . Abdominal hysterectomy     partial  . Thyroid surgery     reports that she has never smoked. She does not have any smokeless tobacco history on file. Her alcohol and drug histories not on file. family history includes Arthritis in her mother; Cancer in her daughter, father, and sister; Diabetes in her brother; Hyperlipidemia in her mother; Hypertension in her brother, mother, and  sister; Mental illness in her mother; and Stroke in her mother and sister. Allergies  Allergen Reactions  . Citalopram Hydrobromide     Shaky inside  . Sulfa Antibiotics     rash      Review of Systems  Constitutional: Positive for fatigue. Negative for fever, chills, appetite change and unexpected weight change.  Respiratory: Negative for cough and shortness of breath.   Cardiovascular: Negative for chest pain, palpitations and leg swelling.  Gastrointestinal: Negative for nausea, vomiting, abdominal pain and blood in stool.  Genitourinary: Positive for dysuria and frequency.  Musculoskeletal: Positive for back pain and arthralgias.  Neurological: Negative for headaches.  Hematological: Negative for adenopathy.  Psychiatric/Behavioral: Negative for hallucinations and agitation.       Objective:   Physical Exam  Constitutional: She appears well-developed and well-nourished.  HENT:  Mouth/Throat: Oropharynx is clear and moist.  Neck: Neck supple.  Cardiovascular: Normal rate and regular rhythm.   Pulmonary/Chest: Effort normal and breath sounds normal. No respiratory distress. She has no wheezes. She has no rales.  Musculoskeletal: She exhibits no edema.       Straight leg raise is negative  Lymphadenopathy:    She has no cervical adenopathy.  Neurological: She is alert. No cranial nerve deficit.       Symmetric reflexes knee and ankle bilaterally. Full-strength right lower extremity          Assessment & Plan:  #1 probable recurrent UTI. Urine dipstick highly suggestive. Urine culture sent. Keflex 500 mg 3 times a day for 7 days. Follow that with prophylactic trimethoprim 100 mg daily. Discussed issues of prophylaxis with daughter.  She has some concern mother maybe wiping back to front #2 fatigue. Likely multifactorial. Question partly related to inactivity. Currently undergoing physical therapy.  #3 low back pain. Suspect degenerative arthritis-?lumbar stenosis. No red  flags for cancer but daughter is concerned regarding this. Given her age we'll go ahead with some screening labs and x-rays

## 2012-07-20 LAB — URINE CULTURE: Colony Count: >=100000

## 2012-07-22 NOTE — Progress Notes (Signed)
Quick Note:  Daughter informed on her personally identified VM ______

## 2012-07-22 NOTE — Progress Notes (Signed)
Quick Note:  Left a message for return call. ______ 

## 2012-07-24 ENCOUNTER — Other Ambulatory Visit (INDEPENDENT_AMBULATORY_CARE_PROVIDER_SITE_OTHER): Payer: Medicare Other

## 2012-07-24 DIAGNOSIS — M549 Dorsalgia, unspecified: Secondary | ICD-10-CM

## 2012-07-24 LAB — CBC WITH DIFFERENTIAL/PLATELET
Basophils Relative: 0.4 % (ref 0.0–3.0)
Eosinophils Relative: 1.9 % (ref 0.0–5.0)
HCT: 38.6 % (ref 36.0–46.0)
Lymphs Abs: 1.7 10*3/uL (ref 0.7–4.0)
MCHC: 33 g/dL (ref 30.0–36.0)
MCV: 92.9 fl (ref 78.0–100.0)
Monocytes Absolute: 0.7 10*3/uL (ref 0.1–1.0)
Platelets: 295 10*3/uL (ref 150.0–400.0)
WBC: 7.8 10*3/uL (ref 4.5–10.5)

## 2012-07-24 LAB — HEPATIC FUNCTION PANEL
Albumin: 3.9 g/dL (ref 3.5–5.2)
Total Protein: 7 g/dL (ref 6.0–8.3)

## 2012-07-24 LAB — BASIC METABOLIC PANEL
CO2: 29 mEq/L (ref 19–32)
Calcium: 9.2 mg/dL (ref 8.4–10.5)
Creatinine, Ser: 0.6 mg/dL (ref 0.4–1.2)
Glucose, Bld: 98 mg/dL (ref 70–99)

## 2012-07-24 NOTE — Progress Notes (Signed)
Quick Note:  Pt daughter Kim informed on personally identified VM ______ 

## 2012-07-28 ENCOUNTER — Telehealth: Payer: Self-pay | Admitting: Family Medicine

## 2012-07-28 NOTE — Telephone Encounter (Signed)
Disregard this phone note. Pt is sch for appt on 07-30-2012 to discuss getting injection in knee

## 2012-07-28 NOTE — Telephone Encounter (Signed)
Pt stated she would like to a ?rooster shot for arthritis. Do we carrying that shot?

## 2012-07-29 ENCOUNTER — Telehealth: Payer: Self-pay | Admitting: Family Medicine

## 2012-07-29 DIAGNOSIS — Z8673 Personal history of transient ischemic attack (TIA), and cerebral infarction without residual deficits: Secondary | ICD-10-CM

## 2012-07-29 NOTE — Telephone Encounter (Signed)
Pts daughter called and said that pt is needing a referral for home nursing re: bathing, etc. Pls advise.

## 2012-07-30 ENCOUNTER — Ambulatory Visit: Payer: Medicare Other | Admitting: Family Medicine

## 2012-07-30 NOTE — Telephone Encounter (Signed)
OK to set up referral. 

## 2012-08-01 ENCOUNTER — Other Ambulatory Visit: Payer: Self-pay | Admitting: *Deleted

## 2012-08-01 DIAGNOSIS — G459 Transient cerebral ischemic attack, unspecified: Secondary | ICD-10-CM

## 2012-08-07 ENCOUNTER — Telehealth: Payer: Self-pay | Admitting: Family Medicine

## 2012-08-07 NOTE — Telephone Encounter (Signed)
I called Kim to confirm correct phone number in chart.  I gave [name] @ [facility]'s phone number also.  I called [facility] and gave the phone number that worked for me.

## 2012-08-07 NOTE — Telephone Encounter (Signed)
[facility] rcvd a referral from [facility] for pt, but [facility] has been unable to contact pt. [facility] called the cell phone listed for pt, but the # was not correct. Tried calling assisted living and have been unable to locate the pt. [facility] wants to know if we have a diff contact # for Pt?

## 2012-08-14 ENCOUNTER — Telehealth: Payer: Self-pay | Admitting: Family Medicine

## 2012-08-14 NOTE — Telephone Encounter (Signed)
We received notification that this pt did not complete L spine films ordered 08/04/12.

## 2012-08-19 ENCOUNTER — Telehealth: Payer: Self-pay | Admitting: Family Medicine

## 2012-08-19 NOTE — Telephone Encounter (Signed)
Daughter Selena Batten informed, I had a lengthy conversation with daughter and she voiced her frustration.  She is going to to call Tedd Sias, mothers insurance co, and also call [facility] and we will go from there.

## 2012-08-19 NOTE — Telephone Encounter (Signed)
Here is the situation with Dorothy. Wiggins at this time Home Health is going through with some issues with their insurance contracts and under staffing. Dorothy Roan was referred to [facility], [facility], and [facility] who all could not accept her as a patient. [facility] cross referred her to [facility] who did accept her.  I am not sure of [facility] Situation but the contact number is [phone]

## 2012-08-19 NOTE — Telephone Encounter (Signed)
Jeronimo Greaves, if a referral to home health has already been done, can we provide phone numbers to [facility] and [facility]?  Please advise

## 2012-08-19 NOTE — Telephone Encounter (Signed)
Pts daughter called and said that [facility] had contacted them, saying that they had rcvd a referral to do home health care, but when daughter called [facility] back to set it up, [place] told her that they were not going to be able to do the referral based on their company. Pts daughter needs to know what other company she can contact?

## 2012-09-04 ENCOUNTER — Telehealth: Payer: Self-pay | Admitting: Family Medicine

## 2012-09-04 DIAGNOSIS — Z9181 History of falling: Secondary | ICD-10-CM

## 2012-09-04 DIAGNOSIS — F0390 Unspecified dementia without behavioral disturbance: Secondary | ICD-10-CM

## 2012-09-04 NOTE — Telephone Encounter (Signed)
Pt was referred about a month ago for home health we sent her several places but due to her insurance they could not accept her. In the end [facility] accepted her as a patient. Today i received a voice message from her daughter stating that they have straightened out there insurance issues and have spoken with [facility] and they will accept her as a patient they just need a referral.

## 2012-09-04 NOTE — Telephone Encounter (Signed)
I called daughter back, mothers dementia has gotten much worse and forgets to take her meds, etc, risk for falls due to knee pain .  Will referr to [facility]

## 2012-09-09 ENCOUNTER — Telehealth: Payer: Self-pay | Admitting: Family Medicine

## 2012-09-09 NOTE — Telephone Encounter (Signed)
Patient [name] dob 1931-05-15.   Marylene Land RN [facility] calling to request orders for PT/OT and a social worker to go to patient's residence for assistance with daughter.  May reach caller at [phone].

## 2012-09-10 NOTE — Telephone Encounter (Signed)
OK to set up referrals as requested.

## 2012-09-10 NOTE — Telephone Encounter (Signed)
Referral to OT, PT and Social worker evaluation verbal order given on Angela's VM

## 2012-11-27 ENCOUNTER — Ambulatory Visit (INDEPENDENT_AMBULATORY_CARE_PROVIDER_SITE_OTHER): Payer: Medicare Other | Admitting: Family Medicine

## 2012-11-27 ENCOUNTER — Encounter: Payer: Self-pay | Admitting: Family Medicine

## 2012-11-27 VITALS — BP 140/68 | Temp 98.3°F | Wt 117.0 lb

## 2012-11-27 DIAGNOSIS — M1710 Unilateral primary osteoarthritis, unspecified knee: Secondary | ICD-10-CM

## 2012-11-27 DIAGNOSIS — R300 Dysuria: Secondary | ICD-10-CM

## 2012-11-27 DIAGNOSIS — Z8669 Personal history of other diseases of the nervous system and sense organs: Secondary | ICD-10-CM

## 2012-11-27 DIAGNOSIS — Z23 Encounter for immunization: Secondary | ICD-10-CM

## 2012-11-27 DIAGNOSIS — Z8679 Personal history of other diseases of the circulatory system: Secondary | ICD-10-CM

## 2012-11-27 DIAGNOSIS — E039 Hypothyroidism, unspecified: Secondary | ICD-10-CM

## 2012-11-27 LAB — POCT URINALYSIS DIPSTICK
Nitrite, UA: NEGATIVE
Urobilinogen, UA: 0.2
pH, UA: 6

## 2012-11-27 MED ORDER — DICLOFENAC SODIUM 1 % TD GEL: 1.0000 "application " | Freq: Four times a day (QID) | TRANSDERMAL | Status: DC

## 2012-11-27 NOTE — Patient Instructions (Addendum)
We will call you regarding cardiology referral.

## 2012-11-27 NOTE — Progress Notes (Signed)
  Subjective:    Patient ID: [name], female    DOB: 03-30-1931, 79 y.o.   MRN: [id number]  HPI Multiple chronic problems:  History of TIA, mild hyperlipidemia, remote history of alcohol abuse, history depression, hypothyroidism, osteoarthritis especially involving hips and knees.  Patient here for several items as follows:  Progressive osteoarthritis. She's had problems left knee greater than right and some progressive problems with both hips. She's had steroid injections and apparently Synvisc injections without much improvement. She's taken tramadol with side effects. Minimal relief with Tylenol. There been some mention of possible knee replacement surgery. She's had extensive physical therapy in the past. No cardiac history but prior history of TIA over one year ago. Patient is nonsmoker.  History of recurrent UTI. We placed her on low-dose prophylaxis with trimethoprim 100 mg daily and she has done fairly well. Family requesting repeat urine today. She has some chronic frequency -no fever or chills. No recent burning.  Hypothyroidism. Patient complains of dry skin. Compliant with therapy.  Past Medical History  Diagnosis Date  . Alcohol abuse   . Arthritis   . Depression   . Stroke    Past Surgical History  Procedure Date  . Cholecystectomy 2008  . Tonsillectomy and adenoidectomy   . Abdominal hysterectomy     partial  . Thyroid surgery     reports that she has never smoked. She does not have any smokeless tobacco history on file. Her alcohol and drug histories not on file. family history includes Arthritis in her mother; Cancer in her daughter, father, and sister; Diabetes in her brother; Hyperlipidemia in her mother; Hypertension in her brother, mother, and sister; Mental illness in her mother; and Stroke in her mother and sister. Allergies  Allergen Reactions  . Citalopram Hydrobromide     Shaky inside  . Sulfa Antibiotics     rash        Review of Systems    Constitutional: Negative for fever, chills, appetite change and unexpected weight change.  Respiratory: Negative for cough and shortness of breath.   Cardiovascular: Negative for chest pain.  Musculoskeletal: Positive for arthralgias and gait problem. Negative for myalgias.  Neurological: Negative for dizziness and syncope.       Objective:   Physical Exam  Constitutional: She appears well-developed and well-nourished.  Neck: Neck supple. No thyromegaly present.  Cardiovascular: Normal rate and regular rhythm.   Pulmonary/Chest: Breath sounds normal. No respiratory distress. She has no wheezes. She has no rales.  Musculoskeletal: She exhibits no edema.       Patient has crepitus with flexion and extension of both knees. No effusion. She has limited range of motion with internal and external rotation of both hips  Lymphadenopathy:    She has no cervical adenopathy.          Assessment & Plan:  #1 advanced osteoarthritis knees and hips. There's been some discussion of possible total knee replacement with orthopedist. She would need cardiac clearance.  Had long discussion with daughter and we will set up cardiology referral to further assess her risk in case they decide to proceed with total knee replacement #2 hypothyroidism. Recheck TSH #3 history of recurrent UTI. Urine repeated per family request. Equivocal dipstick. Urine culture sent #4 health maintenance. Flu vaccine given

## 2012-12-02 ENCOUNTER — Other Ambulatory Visit: Payer: Self-pay | Admitting: *Deleted

## 2012-12-02 MED ORDER — CEPHALEXIN 500 MG PO CAPS: 500.0000 mg | ORAL_CAPSULE | Freq: Three times a day (TID) | ORAL | Status: DC

## 2012-12-02 NOTE — Progress Notes (Signed)
Quick Note:  Pt daughter Selena Batten informed on personally identified VM, med sent ______

## 2012-12-02 NOTE — Progress Notes (Signed)
Quick Note:  Daughter Selena Batten informed, med sent ______

## 2012-12-04 ENCOUNTER — Ambulatory Visit: Payer: Medicare Other | Admitting: Cardiovascular Disease

## 2012-12-24 ENCOUNTER — Ambulatory Visit (INDEPENDENT_AMBULATORY_CARE_PROVIDER_SITE_OTHER): Payer: Medicare Other | Admitting: Cardiovascular Disease

## 2012-12-24 ENCOUNTER — Encounter: Payer: Self-pay | Admitting: Cardiovascular Disease

## 2012-12-24 VITALS — BP 110/68 | HR 86 | Ht 60.05 in | Wt 114.0 lb

## 2012-12-24 DIAGNOSIS — E785 Hyperlipidemia, unspecified: Secondary | ICD-10-CM

## 2012-12-24 DIAGNOSIS — G459 Transient cerebral ischemic attack, unspecified: Secondary | ICD-10-CM

## 2012-12-24 DIAGNOSIS — Z01818 Encounter for other preprocedural examination: Secondary | ICD-10-CM | POA: Insufficient documentation

## 2012-12-24 NOTE — Assessment & Plan Note (Signed)
Mrs. Mclees presents today for cardiology consultation prior to having the surgery. She does not have any prior cardiac history. She denies any chest pain, shortness breath, syncope, or presyncope. She has a right bundle branch block which is unchanged from her previous tracing from 2012. She also had an echocardiogram performed in 2012 which revealed normal left ventricular systolic function with an ejection fraction of 65%.  At this point I do not see any cardiac contraindications for her having her left knee replacement. Using the [facility] criteria she does not have any high-risk signs or symptoms.  Her EKG is unchanged and her echocardiogram from 2012 reveals normal left ventricle systolic function.  She seems to be quite debilitated and weak and I do have concerns about her completing the necessary rehabilitation process following surgery but these are noncardiac issues.    I've not made her a followup appointment but will be glad to see her in the future if needed.

## 2012-12-24 NOTE — Progress Notes (Signed)
[name] Date of Birth  04-04-1931       [facility]    [place] [address]  [address] [place], [place]  [address]   [place], [place]  [address] [phone]     [phone]   Fax  [phone]    Fax [phone]  Problem List: 1. Arthritis 2. Hx of TIA 2012 - no residual deficits.  History of Present Illness:  Pt is here at the request of Dr. [name] to see if she would be able to tolerate knee surgery.  She would require left knee replacement.  She denies any chest pain or dyspnea.   She used to exercise regularly.  She appears to be fairly anxious.   Current Outpatient Prescriptions on File Prior to Visit  Medication Sig Dispense Refill  . acetaminophen (TYLENOL) 650 MG CR tablet Take 650 mg by mouth daily as needed.      . cephALEXin (KEFLEX) 500 MG capsule Take 1 capsule (500 mg total) by mouth 3 (three) times daily.  21 capsule  0  . diclofenac sodium (VOLTAREN) 1 % GEL Apply 1 application topically 4 (four) times daily. 2 grams is one application  1 Tube  3  . folic acid (FOLVITE) 1 MG tablet Take 1 mg by mouth daily.        Marland Kitchen levothyroxine (SYNTHROID, LEVOTHROID) 112 MCG tablet Take 1 tablet (112 mcg total) by mouth daily.  90 tablet  3  . Multiple Vitamins-Minerals (WOMENS 50+ MULTI VITAMIN/MIN) TABS Take by mouth daily.        Marland Kitchen thiamine 100 MG tablet Take 100 mg by mouth daily.        Marland Kitchen trimethoprim (TRIMPEX) 100 MG tablet Take 100 mg by mouth at bedtime.         Allergies  Allergen Reactions  . Citalopram Hydrobromide     Shaky inside  . Sulfa Antibiotics     rash    Past Medical History  Diagnosis Date  . Alcohol abuse   . Arthritis   . Depression   . Stroke     Past Surgical History  Procedure Date  . Cholecystectomy 2008  . Tonsillectomy and adenoidectomy   . Abdominal hysterectomy     partial  . Thyroid surgery     History  Smoking status  . Never Smoker   Smokeless tobacco  .  Not on file    History  Alcohol Use: Not on file    Family History  Problem Relation Age of Onset  . Arthritis Mother   . Hyperlipidemia Mother   . Stroke Mother   . Hypertension Mother   . Mental illness Mother   . Cancer Father     lung  . Cancer Sister     ovarian/uterine  . Stroke Sister   . Hypertension Sister   . Hypertension Brother   . Diabetes Brother   . Cancer Daughter     breast    Reviw of Systems:  Reviewed in the HPI.  All other systems are negative.  Physical Exam: Blood pressure 110/68, pulse 86, height 5' 0.05" (1.525 m), weight 114 lb (51.71 kg). General: Well developed, well nourished, in no acute distress.  Head: Normocephalic, atraumatic, sclera non-icteric, mucus membranes are moist,   Neck: Supple. Carotids are 2 + without bruits. No JVD   Lungs: Clear   Heart: RR, normal S1, S2  Abdomen: Soft, non-tender, non-distended with normal bowel sounds.  Msk:  She is fairly weak. He required tube was to lift her up onto the exam table. She is quite unsteady on her feet.  Extremities: No clubbing or cyanosis. No edema.  Distal pedal pulses are 2+ and equal   Neuro: CN II - XII intact.  Alert and oriented X 3. She seems to be fairly anxious. She appears to have  some balance issues.  Psych:  Normal   ECG: 11/23/2012: Normal sinus rhythm at 86 beats a minute. She has) spot. There are no changes from her previous EKG.  Assessment / Plan:

## 2012-12-24 NOTE — Patient Instructions (Addendum)
Your physician recommends that you schedule a follow-up appointment in: as needed basis   Your physician recommends that you continue on your current medications as directed. Please refer to the Current Medication list given to you today.   

## 2013-01-08 ENCOUNTER — Telehealth: Payer: Self-pay | Admitting: Family Medicine

## 2013-01-08 NOTE — Telephone Encounter (Signed)
Pt daughter would like to bring urine sample in. Pt daughter kim thinks pt may have another uti. Can I lab appt only?

## 2013-01-08 NOTE — Telephone Encounter (Signed)
Yes

## 2013-01-09 NOTE — Telephone Encounter (Signed)
lmom for pt to call back

## 2013-01-09 NOTE — Telephone Encounter (Signed)
Talked w/ patient. Instructed pt to bring in urine sample in something clean.

## 2013-01-15 ENCOUNTER — Other Ambulatory Visit (INDEPENDENT_AMBULATORY_CARE_PROVIDER_SITE_OTHER): Payer: Medicare Other

## 2013-01-15 DIAGNOSIS — R300 Dysuria: Secondary | ICD-10-CM

## 2013-01-15 LAB — POCT URINALYSIS DIPSTICK
Bilirubin, UA: NEGATIVE
Glucose, UA: NEGATIVE
Ketones, UA: NEGATIVE
Spec Grav, UA: 1.015

## 2013-01-20 ENCOUNTER — Telehealth: Payer: Self-pay | Admitting: Family Medicine

## 2013-01-20 ENCOUNTER — Other Ambulatory Visit: Payer: Self-pay | Admitting: *Deleted

## 2013-01-20 MED ORDER — CEPHALEXIN 500 MG PO CAPS: 500.0000 mg | ORAL_CAPSULE | Freq: Three times a day (TID) | ORAL | Status: DC

## 2013-01-20 NOTE — Telephone Encounter (Signed)
Pt daugh would like ua results

## 2013-01-20 NOTE — Telephone Encounter (Signed)
I spoke with [name] again, they have a caregiver coming to mothers home, but not every day, so daughter not sure if her mother has been taking the antibiotic preventively.  If so, it does not seem to be working.  Rx for Keflex [facility] to [facility]

## 2013-01-20 NOTE — Progress Notes (Signed)
Quick Note:  Pt daughter Selena Batten informed. She states her mother has already tried a antibiotic daily? ______

## 2013-01-22 NOTE — Progress Notes (Signed)
Quick Note:  Pt daughter informed on her personally identified VM ______

## 2013-01-29 ENCOUNTER — Other Ambulatory Visit: Payer: Self-pay | Admitting: Family Medicine

## 2013-02-03 ENCOUNTER — Telehealth: Payer: Self-pay | Admitting: *Deleted

## 2013-02-03 DIAGNOSIS — N390 Urinary tract infection, site not specified: Secondary | ICD-10-CM

## 2013-02-03 NOTE — Telephone Encounter (Signed)
Daughter Selena Batten did call back and she does think it would be a good idea to see a urologist.  Will set up referral

## 2013-03-04 ENCOUNTER — Ambulatory Visit (INDEPENDENT_AMBULATORY_CARE_PROVIDER_SITE_OTHER): Payer: Medicare Other | Admitting: Family Medicine

## 2013-03-04 ENCOUNTER — Encounter: Payer: Self-pay | Admitting: Family Medicine

## 2013-03-04 VITALS — BP 120/60 | Temp 97.6°F | Wt 109.0 lb

## 2013-03-04 DIAGNOSIS — E039 Hypothyroidism, unspecified: Secondary | ICD-10-CM

## 2013-03-04 DIAGNOSIS — R5383 Other fatigue: Secondary | ICD-10-CM

## 2013-03-04 DIAGNOSIS — R300 Dysuria: Secondary | ICD-10-CM

## 2013-03-04 DIAGNOSIS — Z9181 History of falling: Secondary | ICD-10-CM

## 2013-03-04 DIAGNOSIS — R5381 Other malaise: Secondary | ICD-10-CM

## 2013-03-04 DIAGNOSIS — R634 Abnormal weight loss: Secondary | ICD-10-CM

## 2013-03-04 LAB — BASIC METABOLIC PANEL
BUN: 19 mg/dL (ref 6–23)
CO2: 27 mEq/L (ref 19–32)
Chloride: 103 mEq/L (ref 96–112)
Creatinine, Ser: 0.6 mg/dL (ref 0.4–1.2)

## 2013-03-04 LAB — POCT URINALYSIS DIPSTICK
Spec Grav, UA: 1.02
Urobilinogen, UA: 0.2
pH, UA: 6.5

## 2013-03-04 LAB — CBC WITH DIFFERENTIAL/PLATELET
Basophils Relative: 0.4 % (ref 0.0–3.0)
Eosinophils Absolute: 0.1 10*3/uL (ref 0.0–0.7)
Lymphs Abs: 1.3 10*3/uL (ref 0.7–4.0)
MCHC: 33.5 g/dL (ref 30.0–36.0)
MCV: 93.4 fl (ref 78.0–100.0)
Monocytes Absolute: 0.9 10*3/uL (ref 0.1–1.0)
Neutrophils Relative %: 71.9 % (ref 43.0–77.0)
Platelets: 297 10*3/uL (ref 150.0–400.0)
RBC: 4.26 Mil/uL (ref 3.87–5.11)

## 2013-03-04 MED ORDER — CEPHALEXIN 500 MG PO CAPS: 500.0000 mg | ORAL_CAPSULE | Freq: Three times a day (TID) | ORAL | Status: DC

## 2013-03-04 MED ORDER — MIRTAZAPINE 7.5 MG PO TABS: 7.5000 mg | ORAL_TABLET | Freq: Every day | ORAL | Status: DC

## 2013-03-04 NOTE — Progress Notes (Signed)
  Subjective:    Patient ID: [name], female    DOB: [birth year], 79 y.o.   MRN: [id number]  HPI Patient seen for multiple issues. Chronic problems include history of hypothyroidism, TIA, history of depression, hyperlipidemia, remote history of alcohol abuse, osteoarthritis, cognitive impairment. She resides at assisted living place. History of frequent UTIs in the past. Some recent vague symptoms of dysuria. Not taking trimethoprim prophylactically consistently. Some recent reported loss of weight -by our scales 8 pounds over the past few months. She has had increased fatigue. Daughter is worried about possible mood disorder. No recent fever. No abdominal pain. No headaches. Just some chronic confusion which comes and goes. No focal weakness. No myalgias.  Very high risk of falls. She has advanced osteoarthritis. More debilitated in recent months. Ambulates with walker.  Only prescription medication is levothyroxin. Recent TSH normal. She takes Tylenol for arthritic pains  Past Medical History  Diagnosis Date  . Alcohol abuse   . Arthritis   . Depression   . Stroke    Past Surgical History  Procedure Laterality Date  . Cholecystectomy  2008  . Tonsillectomy and adenoidectomy    . Abdominal hysterectomy      partial  . Thyroid surgery      reports that she has never smoked. She does not have any smokeless tobacco history on file. Her alcohol and drug histories are not on file. family history includes Arthritis in her mother; Cancer in her daughter, father, and sister; Diabetes in her brother; Hyperlipidemia in her mother; Hypertension in her brother, mother, and sister; Mental illness in her mother; and Stroke in her mother and sister. Allergies  Allergen Reactions  . Citalopram Hydrobromide     Shaky inside  . Sulfa Antibiotics     rash      Review of Systems  Constitutional: Positive for unexpected weight change. Negative for chills, appetite change and fatigue.   HENT: Negative for trouble swallowing.   Respiratory: Negative for cough and shortness of breath.   Cardiovascular: Negative for chest pain and palpitations.  Gastrointestinal: Negative for abdominal pain.  Genitourinary: Positive for dysuria.  Neurological: Negative for dizziness and headaches.       Objective:   Physical Exam  Constitutional: She appears well-developed and well-nourished. No distress.  HENT:  Mouth/Throat: Oropharynx is clear and moist.  Neck: Neck supple. No thyromegaly present.  Cardiovascular: Normal rate and regular rhythm.  Exam reveals no gallop.   Pulmonary/Chest: Effort normal and breath sounds normal. No respiratory distress. She has no wheezes. She has no rales.  Abdominal: Soft. Bowel sounds are normal. She exhibits no distension and no mass. There is no tenderness. There is no rebound and no guarding.  Musculoskeletal: She exhibits no edema.  Lymphadenopathy:    She has no cervical adenopathy.  Neurological: She is alert.  Skin: No rash noted.          Assessment & Plan:  #1 weight loss. Question related to mood disorder. Check labs with CBC and basic metabolic panel. Recent TSH normal. Start Remeron 7.5 mg each bedtime and reassess one month #2 history of frequent UTI. Recent vague dysuria. Repeat urine dipstick #3 high risk for fall. Progressive weakness (generalized).  Set up home physical therapy

## 2013-03-05 NOTE — Progress Notes (Signed)
Quick Note:  Daughter Selena Batten informed on personally identified VM ______

## 2013-03-06 LAB — URINE CULTURE

## 2013-03-09 ENCOUNTER — Encounter: Payer: Self-pay | Admitting: *Deleted

## 2013-03-09 NOTE — Progress Notes (Signed)
Quick Note:  Daughter Selena Batten informed on her personally identified VM, Rx was sent to pharmacy on 4/16. ______

## 2013-03-27 ENCOUNTER — Encounter: Payer: Self-pay | Admitting: Family Medicine

## 2013-03-27 ENCOUNTER — Ambulatory Visit: Payer: Medicare Other | Admitting: Family Medicine

## 2013-03-27 ENCOUNTER — Ambulatory Visit (INDEPENDENT_AMBULATORY_CARE_PROVIDER_SITE_OTHER): Payer: Medicare Other | Admitting: Family Medicine

## 2013-03-27 VITALS — Temp 98.0°F | Wt 110.0 lb

## 2013-03-27 DIAGNOSIS — R300 Dysuria: Secondary | ICD-10-CM

## 2013-03-27 DIAGNOSIS — F09 Unspecified mental disorder due to known physiological condition: Secondary | ICD-10-CM

## 2013-03-27 DIAGNOSIS — N390 Urinary tract infection, site not specified: Secondary | ICD-10-CM

## 2013-03-27 DIAGNOSIS — R4189 Other symptoms and signs involving cognitive functions and awareness: Secondary | ICD-10-CM

## 2013-03-27 DIAGNOSIS — R634 Abnormal weight loss: Secondary | ICD-10-CM

## 2013-03-27 LAB — POCT URINALYSIS DIPSTICK
Bilirubin, UA: NEGATIVE
Glucose, UA: NEGATIVE

## 2013-03-27 MED ORDER — CIPROFLOXACIN HCL 250 MG PO TABS: 250.0000 mg | ORAL_TABLET | Freq: Two times a day (BID) | ORAL | Status: DC

## 2013-03-27 MED ORDER — TRIMETHOPRIM 100 MG PO TABS: 100.0000 mg | ORAL_TABLET | Freq: Every day | ORAL | Status: DC

## 2013-03-27 MED ORDER — LEVOTHYROXINE SODIUM 112 MCG PO TABS: ORAL_TABLET | ORAL | Status: DC

## 2013-03-27 NOTE — Progress Notes (Signed)
Subjective:    Patient ID: [name], female    DOB: 1930-12-23, 79 y.o.   MRN: [id number]  HPI Elderly female accompanied by daughter with multiple issues:  History of recurrent UTI. They noticed some cloudy urine in past couple days. No reported fever. She has had some increased confusion which may be related. We have discussed that she may have history of asymptomatic bacteriuria but she's had positive urine cultures with different pathogens. No recent fever, nausea, or vomiting. Recent Klebsiella pneumonia UTI which did seem to improve following Keflex.  She's had gradual increase in debilitation. Loss of weight. Started Remeron 7.5 mg last visit. Appetite is slightly improved. Her weight has stabilized and is actually up 1 pound from last visit. Daughter had some question whether she's having some confusion for the past week but not clearly related to Remeron  Progressive weakness. We set up home health physical therapy which she is still getting. Minimal improvement. Daughter's feeling overwhelmed with her care. They're looking at getting some daily assistance for ADLs  Hypothyroidism treated with levothyroxine. Recent TSH at goal  Continues to complain of some arthritic complaints. She has seen orthopedist previously. She is not a good surgical candidate with multiple comorbidities. She has significant osteoarthritis involving hips and knees  Past Medical History  Diagnosis Date  . Alcohol abuse   . Arthritis   . Depression   . Stroke    Past Surgical History  Procedure Laterality Date  . Cholecystectomy  2008  . Tonsillectomy and adenoidectomy    . Abdominal hysterectomy      partial  . Thyroid surgery      reports that she has never smoked. She does not have any smokeless tobacco history on file. Her alcohol and drug histories are not on file. family history includes Arthritis in her mother; Cancer in her daughter, father, and sister; Diabetes in her brother;  Hyperlipidemia in her mother; Hypertension in her brother, mother, and sister; Mental illness in her mother; and Stroke in her mother and sister. Allergies  Allergen Reactions  . Citalopram Hydrobromide     Shaky inside  . Sulfa Antibiotics     rash      Review of Systems  Constitutional: Negative for fever, chills and appetite change.  HENT: Negative for trouble swallowing.   Respiratory: Negative for cough and shortness of breath.   Cardiovascular: Negative for chest pain.  Gastrointestinal: Negative for abdominal pain.  Genitourinary: Positive for dysuria. Negative for hematuria.  Psychiatric/Behavioral: Positive for confusion. Negative for agitation. The patient is nervous/anxious.        Objective:   Physical Exam  Constitutional:  Alert cooperative elderly female. In no distress  HENT:  Mouth/Throat: Oropharynx is clear and moist.  Neck: Neck supple. No thyromegaly present.  Cardiovascular: Normal rate and regular rhythm.   Pulmonary/Chest: Effort normal and breath sounds normal. No respiratory distress. She has no wheezes. She has no rales.  Musculoskeletal: She exhibits no edema.  Neurological: She is alert.          Assessment & Plan:  #1 recurrent UTI. Urine today highly suggestive. Even though history unreliable she's had some recent increase cognitive deficits which could be related. Repeat urine culture. Start Cipro 250 mg twice daily for 7 days. Start back trimethoprim for prevention following this UTI #2 weight loss. Appears to stabilized with Remeron. Continue Remeron 7.5 mg each bedtime #3 worsening cognitive impairment. Possibly related to #1 #4 progressive disability. We're trying to get in-home assistant.  Already has physical therapy

## 2013-03-30 ENCOUNTER — Ambulatory Visit: Payer: Medicare Other | Admitting: Family Medicine

## 2013-03-30 ENCOUNTER — Telehealth: Payer: Self-pay | Admitting: Family Medicine

## 2013-03-30 DIAGNOSIS — R4189 Other symptoms and signs involving cognitive functions and awareness: Secondary | ICD-10-CM

## 2013-03-30 LAB — URINE CULTURE: Colony Count: >=100000

## 2013-03-30 NOTE — Telephone Encounter (Signed)
Referral made, Daughter Selena Batten informed

## 2013-03-30 NOTE — Telephone Encounter (Signed)
Patient family would referral for [facility] for [facility] and any other services.

## 2013-03-30 NOTE — Telephone Encounter (Signed)
OK to refer.  They are looking at any assistance they can get for ADLs,

## 2013-03-31 NOTE — Progress Notes (Signed)
Quick Note:  Pt daughter Selena Batten informed on personally identified VM ______

## 2013-04-03 ENCOUNTER — Ambulatory Visit: Payer: Medicare Other | Admitting: Family Medicine

## 2013-04-03 ENCOUNTER — Emergency Department (HOSPITAL_COMMUNITY)
Admission: EM | Admit: 2013-04-03 | Discharge: 2013-04-03 | Disposition: A | Discharge status: Home / Self Care | Payer: Medicare Other | Attending: Emergency Medicine | Admitting: Emergency Medicine

## 2013-04-03 ENCOUNTER — Emergency Department (HOSPITAL_COMMUNITY): Payer: Medicare Other

## 2013-04-03 ENCOUNTER — Encounter (HOSPITAL_COMMUNITY): Payer: Self-pay | Admitting: Emergency Medicine

## 2013-04-03 ENCOUNTER — Telehealth: Payer: Self-pay | Admitting: Internal Medicine

## 2013-04-03 ENCOUNTER — Telehealth: Payer: Self-pay | Admitting: Family Medicine

## 2013-04-03 DIAGNOSIS — E039 Hypothyroidism, unspecified: Secondary | ICD-10-CM | POA: Insufficient documentation

## 2013-04-03 DIAGNOSIS — F1010 Alcohol abuse, uncomplicated: Secondary | ICD-10-CM | POA: Insufficient documentation

## 2013-04-03 DIAGNOSIS — H5316 Psychophysical visual disturbances: Secondary | ICD-10-CM | POA: Insufficient documentation

## 2013-04-03 DIAGNOSIS — N390 Urinary tract infection, site not specified: Secondary | ICD-10-CM | POA: Insufficient documentation

## 2013-04-03 DIAGNOSIS — Z8739 Personal history of other diseases of the musculoskeletal system and connective tissue: Secondary | ICD-10-CM | POA: Insufficient documentation

## 2013-04-03 DIAGNOSIS — F329 Major depressive disorder, single episode, unspecified: Secondary | ICD-10-CM | POA: Insufficient documentation

## 2013-04-03 DIAGNOSIS — R441 Visual hallucinations: Secondary | ICD-10-CM

## 2013-04-03 DIAGNOSIS — Z8673 Personal history of transient ischemic attack (TIA), and cerebral infarction without residual deficits: Secondary | ICD-10-CM | POA: Insufficient documentation

## 2013-04-03 DIAGNOSIS — F3289 Other specified depressive episodes: Secondary | ICD-10-CM | POA: Insufficient documentation

## 2013-04-03 DIAGNOSIS — F068 Other specified mental disorders due to known physiological condition: Secondary | ICD-10-CM | POA: Insufficient documentation

## 2013-04-03 DIAGNOSIS — Z79899 Other long term (current) drug therapy: Secondary | ICD-10-CM | POA: Insufficient documentation

## 2013-04-03 LAB — CBC
HCT: 35.2 % — ABNORMAL LOW (ref 36.0–46.0)
Hemoglobin: 12.3 g/dL (ref 12.0–15.0)
MCH: 31.3 pg (ref 26.0–34.0)
MCHC: 34.9 g/dL (ref 30.0–36.0)
MCV: 89.6 fL (ref 78.0–100.0)
Platelets: 299 K/uL (ref 150–400)
RBC: 3.93 MIL/uL (ref 3.87–5.11)
RDW: 12.7 % (ref 11.5–15.5)
WBC: 7.3 K/uL (ref 4.0–10.5)

## 2013-04-03 LAB — URINALYSIS, ROUTINE W REFLEX MICROSCOPIC
Bilirubin Urine: NEGATIVE
Glucose, UA: NEGATIVE mg/dL
Hgb urine dipstick: NEGATIVE
Ketones, ur: 15 mg/dL — AB
Nitrite: NEGATIVE
Protein, ur: NEGATIVE mg/dL
Specific Gravity, Urine: 1.018 (ref 1.005–1.030)
Urobilinogen, UA: 0.2 mg/dL (ref 0.0–1.0)
pH: 5.5 (ref 5.0–8.0)

## 2013-04-03 LAB — COMPREHENSIVE METABOLIC PANEL
AST: 19 U/L (ref 0–37)
Albumin: 3.7 g/dL (ref 3.5–5.2)
Calcium: 9.1 mg/dL (ref 8.4–10.5)
Chloride: 101 mEq/L (ref 96–112)
Creatinine, Ser: 0.56 mg/dL (ref 0.50–1.10)

## 2013-04-03 LAB — COMPREHENSIVE METABOLIC PANEL WITH GFR
ALT: 13 U/L (ref 0–35)
Alkaline Phosphatase: 72 U/L (ref 39–117)
BUN: 10 mg/dL (ref 6–23)
CO2: 26 meq/L (ref 19–32)
GFR calc Af Amer: >90 mL/min (ref >90)
GFR calc non Af Amer: 86 mL/min — ABNORMAL LOW (ref >90)
Glucose, Bld: 123 mg/dL — ABNORMAL HIGH (ref 70–99)
Potassium: 3.9 meq/L (ref 3.5–5.1)
Sodium: 137 meq/L (ref 135–145)
Total Bilirubin: 0.4 mg/dL (ref 0.3–1.2)
Total Protein: 6.8 g/dL (ref 6.0–8.3)

## 2013-04-03 LAB — URINE MICROSCOPIC-ADD ON

## 2013-04-03 LAB — ETHANOL: Alcohol, Ethyl (B): <11 mg/dL (ref 0–11)

## 2013-04-03 NOTE — ED Provider Notes (Addendum)
History     CSN: [id number]  Arrival date & time 04/03/13  1229   First MD Initiated Contact with Patient 04/03/13 1638      Chief Complaint  Patient presents with  . Altered Mental Status  . Urinary Tract Infection    (Consider location/radiation/quality/duration/timing/severity/associated sxs/prior treatment) HPI Comments: Pt with h/o frequent UTI's, no reported h/o dementia, and recently with worsening visual hallucinations, has had more frequent episodes of confusion along with visual hallucinations of people that she used to know who are now deceased.  She reprots they are cooking or eating, and occurs more often at night.  She has a h/o TIA's per family.  Pt was first on a round of keflex and got better for about 4-5 days per daughter, then got worse again with confusion and hallucinations.  Repeat UA showed different bacteria, worse per daughter, now on 1 week of cipro.  Pt still having episodes of confusion, she denies HA, stiff neck, fever, cough, N/V/D.  Daughter called her own physician who recommended that the pt be seen in the ED.  Pt last seen by PCP DR. Burchette last week for follow up of the UTI and her episodes of confusion.  Level 5 caveat due to suspected dementia and mild disorientation.  Patient is a 79 y.o. female presenting with altered mental status and urinary tract infection. The history is provided by the patient, a relative and medical records.  Altered Mental Status  Urinary Tract Infection    Past Medical History  Diagnosis Date  . Alcohol abuse   . Arthritis   . Depression   . Stroke   . Thyroid disease     hypothyroid    Past Surgical History  Procedure Laterality Date  . Cholecystectomy  2008  . Tonsillectomy and adenoidectomy    . Abdominal hysterectomy      partial  . Thyroid surgery      Family History  Problem Relation Age of Onset  . Arthritis Mother   . Hyperlipidemia Mother   . Stroke Mother   . Hypertension Mother   . Mental  illness Mother   . Cancer Father     lung  . Cancer Sister     ovarian/uterine  . Stroke Sister   . Hypertension Sister   . Hypertension Brother   . Diabetes Brother   . Cancer Daughter     breast    History  Substance Use Topics  . Smoking status: Never Smoker   . Smokeless tobacco: Not on file  . Alcohol Use: Not on file    OB History   Grav Para Term Preterm Abortions TAB SAB Ect Mult Living                  Review of Systems  Unable to perform ROS: Dementia  Psychiatric/Behavioral: Positive for altered mental status.    Allergies  Citalopram hydrobromide and Sulfa antibiotics  Home Medications   Current Outpatient Rx  Name  Route  Sig  Dispense  Refill  . acetaminophen (TYLENOL) 650 MG CR tablet   Oral   Take 325 mg by mouth 2 (two) times daily.          . ciprofloxacin (CIPRO) 250 MG tablet   Oral   Take 250 mg by mouth 2 (two) times daily. For 7 days; Start date 03/27/13         . folic acid (FOLVITE) 1 MG tablet   Oral   Take 1 mg by  mouth daily.           Marland Kitchen levothyroxine (SYNTHROID, LEVOTHROID) 112 MCG tablet   Oral   Take 112 mcg by mouth daily after breakfast.         . mirtazapine (REMERON) 7.5 MG tablet   Oral   Take 1 tablet (7.5 mg total) by mouth at bedtime.   30 tablet   3   . Multiple Vitamin (MULTIVITAMIN WITH MINERALS) TABS   Oral   Take 1 tablet by mouth daily.         Marland Kitchen thiamine 100 MG tablet   Oral   Take 100 mg by mouth daily.           Marland Kitchen trimethoprim (TRIMPEX) 100 MG tablet   Oral   Take 1 tablet (100 mg total) by mouth at bedtime.   30 tablet   5     BP 157/77  Pulse 69  Temp(Src) 98.7 F (37.1 C) (Oral)  Resp 18  SpO2 98%  Physical Exam  Nursing note and vitals reviewed. Constitutional: She appears well-developed and well-nourished.  Non-toxic appearance. She does not have a sickly appearance. She does not appear ill. No distress.  HENT:  Head: Normocephalic and atraumatic.  Eyes: EOM are  normal. No scleral icterus.  Neck: Neck supple.  Cardiovascular: Normal rate and intact distal pulses.   Pulmonary/Chest: Effort normal. She has no wheezes. She has no rales.  Abdominal: Soft. She exhibits no distension. There is no tenderness. There is no rebound and no guarding.  Neurological: She is alert. No cranial nerve deficit. She exhibits normal muscle tone.  Skin: Skin is warm. She is not diaphoretic.  Psychiatric: She has a normal mood and affect.    ED Course  Procedures (including critical care time)  Labs Reviewed  COMPREHENSIVE METABOLIC PANEL - Abnormal; Notable for the following:    Glucose, Bld 123 (*)    GFR calc non Af Amer 86 (*)    All other components within normal limits  CBC - Abnormal; Notable for the following:    HCT 35.2 (*)    All other components within normal limits  URINALYSIS, ROUTINE W REFLEX MICROSCOPIC - Abnormal; Notable for the following:    APPearance CLOUDY (*)    Ketones, ur 15 (*)    Leukocytes, UA SMALL (*)    All other components within normal limits  URINE MICROSCOPIC-ADD ON  ETHANOL   Ct Head Wo Contrast  04/03/2013   *RADIOLOGY REPORT*  Clinical Data: Altered mental status and urinary tract infection  CT HEAD WITHOUT CONTRAST  Technique:  Contiguous axial images were obtained from the base of the skull through the vertex without contrast.  Comparison: Head CT 02/14/2011  Findings: Negative for hemorrhage, hydrocephalus, mass effect, mass lesion, or evidence of acute cortically based infarction. Periventricular hypoattenuation bilaterally is stable and most consistent with chronic microvascular ischemic change.  The visualized paranasal sinuses, mastoid air cells, and middle ears are clear.  The skull is intact.  The soft tissues of the scalp and orbits are symmetric.  IMPRESSION: No acute intracranial abnormality.  Chronic microvascular ischemic changes.   Original Report Authenticated By: Britta Mccreedy, M.D.     1. Hallucinations,  visual     ra sat is 98% and I interpret to be normal  7:23 PM Spoke to on call physician for Dr. [name] who will inform Dr. [name] of eval in the ED tonight and he can contact pt and family for early re-evaluation in  the office.  MDM  Pt is disoriented to time, but talkative, follows commands, freely admits to visual hallucinations.  I suspect underlying dementia and associated hallucinations.  CT of head shows no acute, no UTI on testing today and pt is to finish Cipro today.  Will need close follow up with PCP and possibly neurology referral.          Gavin Pound. Oletta Lamas, MD 04/03/13 1911  Gavin Pound. Oletta Lamas, MD 04/03/13 1924

## 2013-04-03 NOTE — Discharge Instructions (Signed)
Hallucinations and Delusions  You seem to be having hallucinations and/or delusions. You may be hearing voices that no one else can hear. This can seem very real to you. You may be having thoughts and fears that do not make sense to others. This condition can be due to mental disease like schizophrenia. It may be caused by a medical condition, such as an infection or electrolyte disturbance. These symptoms are also seen in drug abusers, especially those who use crack cocaine and amphetamines. Drugs like PCP, LSD, MDMA, peyote, and psilocybin can also cause frightening hallucinations and loss of control.  If your symptoms are due to drug abuse, your mental state should improve as the drug(s) leave your system. Someone you trust should be with you until you are better to protect you and calm your fears. Often tranquilizers are very helpful at controlling hallucinations, anxiety, and destructive behavior. Getting a proper diet and enough sleep is important to recovery. If your symptoms are not due to drugs, or do not improve over several days after stopping drug use, you need further medical or mental health care.  SEEK IMMEDIATE MEDICAL CARE IF:    Your symptoms get worse, especially if you think your life is in danger   You have violent or destructive thoughts.  Recovery is possible, but you have to get proper treatment and avoid drugs that are known to cause you trouble.  Document Released: 12/13/2004 Document Revised: 01/28/2012 Document Reviewed: 11/05/2005  ExitCare Patient Information 2013 [employer].

## 2013-04-03 NOTE — ED Notes (Signed)
Pt here with family c/o AMS upon waking this am with some hallucinations; pt currently on cipro for UTI; pt denies fever; pt alert to person and place but disoriented to time at present

## 2013-04-03 NOTE — Telephone Encounter (Signed)
Patient Information:  Caller Name: Selena Batten  Phone: [phone]  Patient: [name], [name]  Gender: Female  DOB: 06-12-1931  Age: 79 Years  PCP: Evelena Peat [facility])  Office Follow Up:  Does the office need to follow up with this patient?: No  Instructions For The Office: N/A  RN Note:  Daughter states patient was prescribed Cipro for recurrent Urinary Tract Infection. States patient has had increased confusion and is " delusional," onset 03/30/13. States patient continues to have burning with urination. States urine is dark yellow in color. Daughter states patient is having visual and auditory hallucinations. No facial drooping noted. No extremity weakness noted. Daughter states patient has generalized weakness. RN spoke with [place], in office. Informed of above. Orders received: Holly states, per order of Dr. [name], Patient to go to the ED for evaluation. States Daughter may transport, per private vehicle, if able to safely transport, otherwise, advise Daughter to call Emergency Medical Services. Daughter informed of above. Daughter verbalizes understanding and agreeable. States Patient will be transported to [facility] ED for evaluation.  Symptoms  Reason For Call & Symptoms: Confusion  Reviewed Health History In EMR: Yes  Reviewed Medications In EMR: Yes  Reviewed Allergies In EMR: Yes  Reviewed Surgeries / Procedures: Yes  Date of Onset of Symptoms: 03/30/2013  Guideline(s) Used:  Urination Pain - Female  Confusion - Delirium  Disposition Per Guideline:   Call EMS 911 Now  Reason For Disposition Reached:   Seeing or hearing or feeling things that are not there (i.e., auditory, visual, or tactile hallucinations)  Advice Given:  Fluids:   Drink extra fluids. Drink 8-10 glasses of liquids a day (Reason: to produce a dilute, non-irritating urine).  Call Back If:  You become worse.  Call Back If:   You become worse.  RN Overrode Recommendation:  Go To ED  Per order of Dr. [name], Patient may be transported to ED via Private vehicle if daughter is able to safely transport.

## 2013-04-03 NOTE — Telephone Encounter (Signed)
PC with ER doctor Evaluated for some change in status Concern for ongoing UTI (sounds like it may be asymptomatic bacteriuria) No need for admission  Ongoing hallucinations and delusions (seeing dead relatives) certainly raises the spector of Lewy body or other dementia May need further evaluation for this

## 2013-04-29 ENCOUNTER — Other Ambulatory Visit (INDEPENDENT_AMBULATORY_CARE_PROVIDER_SITE_OTHER): Payer: Medicare Other

## 2013-04-29 ENCOUNTER — Telehealth: Payer: Self-pay | Admitting: Family Medicine

## 2013-04-29 DIAGNOSIS — R300 Dysuria: Secondary | ICD-10-CM

## 2013-04-29 DIAGNOSIS — N390 Urinary tract infection, site not specified: Secondary | ICD-10-CM

## 2013-04-29 LAB — POCT URINALYSIS DIPSTICK
Glucose, UA: NEGATIVE
Nitrite, UA: POSITIVE
Urobilinogen, UA: 0.2
pH, UA: 7

## 2013-04-29 NOTE — Telephone Encounter (Signed)
Pt daughter would like to bring in urine sample to check to see if abx cleared up infection. Pt has sch appt with urology center tomorrow if urine is clear they will cancel that appt.

## 2013-04-29 NOTE — Telephone Encounter (Signed)
[name] please put order in sytem for urine test. Pt is sch for today at 4pm

## 2013-04-29 NOTE — Telephone Encounter (Signed)
We can repeat

## 2013-04-29 NOTE — Telephone Encounter (Signed)
Please advise 

## 2013-04-29 NOTE — Telephone Encounter (Signed)
Order placed

## 2013-04-30 ENCOUNTER — Telehealth: Payer: Self-pay | Admitting: Family Medicine

## 2013-04-30 NOTE — Telephone Encounter (Signed)
I explained to daughter Selena Batten UA did have Tr blood and leukocytes +2, so Dr [name] ordered a culture.  Kim reports the caregiver has been making sure she takes the Trimpex daily.  Selena Batten reports pt is very confused again.  Because they cancelled the urologist appt, questioning now what to do?  ROV with Dr [name] or re-schedule Urology appt.

## 2013-04-30 NOTE — Telephone Encounter (Signed)
Pt's daughter called for results of pt's UA. Would like to know ASAP. They canceled urologist appt yesterday and  to wait until results of UA get back before they schedule that appt.

## 2013-05-01 NOTE — Telephone Encounter (Signed)
Urologist will not deal with that issue.  Avoid constipation, warm sitz baths, anusol hc cream as needed for pain.

## 2013-05-01 NOTE — Progress Notes (Signed)
Quick Note:  Pt daughter informed ______ 

## 2013-05-01 NOTE — Telephone Encounter (Signed)
[name] pt daughter informed.  [name] is asking for direction for her moms painful hemmoroids.  They seem to be getting worse, questioning if that is something Urology will assist with, or can Dr [name] prescribe something for her.  Pt was seen by [facility] rehab yesterday and they are recommending partial left knee surgery sometime next month.  FYI

## 2013-05-01 NOTE — Telephone Encounter (Signed)
Pt daughter informed

## 2013-05-01 NOTE — Telephone Encounter (Signed)
Reschedule with urology.

## 2013-06-29 ENCOUNTER — Non-Acute Institutional Stay (SKILLED_NURSING_FACILITY): Payer: Medicare Other | Admitting: Internal Medicine

## 2013-06-29 DIAGNOSIS — M1710 Unilateral primary osteoarthritis, unspecified knee: Secondary | ICD-10-CM

## 2013-06-29 DIAGNOSIS — I10 Essential (primary) hypertension: Secondary | ICD-10-CM

## 2013-06-29 DIAGNOSIS — E039 Hypothyroidism, unspecified: Secondary | ICD-10-CM

## 2013-06-29 DIAGNOSIS — D62 Acute posthemorrhagic anemia: Secondary | ICD-10-CM

## 2013-07-03 ENCOUNTER — Non-Acute Institutional Stay (SKILLED_NURSING_FACILITY): Payer: Medicare Other | Admitting: Adult Health

## 2013-07-03 ENCOUNTER — Encounter: Payer: Self-pay | Admitting: Adult Health

## 2013-07-03 DIAGNOSIS — E039 Hypothyroidism, unspecified: Secondary | ICD-10-CM

## 2013-07-03 DIAGNOSIS — M1710 Unilateral primary osteoarthritis, unspecified knee: Secondary | ICD-10-CM

## 2013-07-03 DIAGNOSIS — N390 Urinary tract infection, site not specified: Secondary | ICD-10-CM

## 2013-07-03 DIAGNOSIS — G4700 Insomnia, unspecified: Secondary | ICD-10-CM

## 2013-07-03 DIAGNOSIS — D649 Anemia, unspecified: Secondary | ICD-10-CM | POA: Insufficient documentation

## 2013-07-03 DIAGNOSIS — K5900 Constipation, unspecified: Secondary | ICD-10-CM

## 2013-07-03 DIAGNOSIS — M179 Osteoarthritis of knee, unspecified: Secondary | ICD-10-CM

## 2013-07-03 NOTE — Progress Notes (Signed)
Patient ID: [name], female   DOB: 10-27-1931, 79 y.o.   MRN: [id number]       PROGRESS NOTE  DATE: 07/03/2013   FACILITY:   [facility] LEVEL OF CARE:   SNF (31)   CHIEF COMPLAINT: Discharge Visit  HISTORY OF PRESENT ILLNESS: This is an 79 year old female who is for discharge to senior apartment with home health PT, OT and Nursing Aide. She has been admitted to [facility] placed on 06/26/13 from [facility] with osteoarthritis status post left knee Makoplasty. Patient was admitted to this facility for short-term rehabilitation. Patient has completed SNF rehabilitation and therapy has cleared the patient for discharge.  Reassessment of ongoing problem(s):  HYPOTHYROIDISM: The hypothyroidism remains stable. No complications noted from the medications presently being used.  The patient denies fatigue or constipation.   ANEMIA: The anemia has been stable. The patient denies fatigue, melena or hematochezia. No complications from the medications currently being used.  PAST MEDICAL HISTORY : Reviewed.  No changes.  CURRENT MEDICATIONS: Reviewed per [facility]  REVIEW OF SYSTEMS:  GENERAL: no change in appetite, no fatigue, no weight changes, no fever, chills or weakness RESPIRATORY: no cough, SOB, DOE, wheezing, hemoptysis CARDIAC: no chest pain, edema or palpitations GI: no abdominal pain, diarrhea, constipation, heart burn, nausea or vomiting  PHYSICAL EXAMINATION  VS:  T 96.6       P 78       RR 20      BP 1:30/75      POX 96 %       WT 112 (Lb)  GENERAL: no acute distress, normal body habitus EYES: conjunctivae normal, sclerae normal, normal eye lids NECK: supple, trachea midline, no neck masses, no thyroid tenderness, no thyromegaly LYMPHATICS: no LAN in the neck, no supraclavicular LAN RESPIRATORY: breathing is even & unlabored, BS CTAB CARDIAC: RRR, no murmur,no extra heart sounds, no edema GI: abdomen soft, normal BS, no masses, no tenderness, no  hepatomegaly, no splenomegaly PSYCHIATRIC: the patient is alert & oriented to person, affect & behavior appropriate  LABS/RADIOLOGY: 06/25/13 sodium 136 potassium 4.2 BUN 15 glucose 103 creatinine 0.56 calcium 8.6 WBC 10.5 hemoglobin 10.6 hematocrit 31.9   ASSESSMENT/PLAN:  Osteoarthritis is status post left knee makoplasty - for continued rehabilitation; home health PT, OT and nursing aide  Anemia - stable  Hypothyroidism - stable  Insomnia - no complaints  Constipation - no complaints  Repair and UTIs - continue Trimethoprim prophylaxis   I have filled out patient's discharge paperwork and written prescriptions.  Patient will receive home health PT, OT and Nursing Aide   Total discharge time: Less than 30 minutes Discharge time involved coordination of the discharge process with social worker, nursing staff and therapy department. Medical justification for home health services verified.   CPT CODE: 40981

## 2013-07-22 DIAGNOSIS — D62 Acute posthemorrhagic anemia: Secondary | ICD-10-CM | POA: Insufficient documentation

## 2013-07-22 DIAGNOSIS — I10 Essential (primary) hypertension: Secondary | ICD-10-CM | POA: Insufficient documentation

## 2013-07-22 NOTE — Progress Notes (Signed)
Patient ID: [name], female   DOB: 1931-02-23, 79 y.o.   MRN: [id number]        HISTORY & PHYSICAL  DATE: 06/29/2013   FACILITY: [facility]  LEVEL OF CARE: SNF (31)  ALLERGIES:  Allergies  Allergen Reactions  . Citalopram Hydrobromide     Shaky inside  . Sulfa Antibiotics     rash    CHIEF COMPLAINT:  Manage left knee osteoarthritis, hypothyroidism, and hypertension.    HISTORY OF PRESENT ILLNESS:  The patient is an 79 year-old, Caucasian female.    KNEE OSTEOARTHRITIS: Patient had a history of pain and functional disability in the knee due to end-stage osteoarthritis and has failed nonsurgical conservative treatments. Patient had worsening of pain with activity and weight bearing, pain that interfered with activities of daily living & pain with passive range of motion. Therefore patient underwent total knee arthroplasty and tolerated the procedure well. Patient is admitted to this facility for sort short-term rehabilitation. Patient denies knee pain.   HTN: Pt 's HTN remains stable.  Denies CP, sob, DOE, pedal edema, headaches, dizziness or visual disturbances.  No complications from the medications currently being used.  Last BP :  130/75, 118/67.  HYPOTHYROIDISM: The hypothyroidism remains stable. No complications noted from the medications presently being used.  The patient denies fatigue or constipation.  Last TSH:  Not available.     PAST MEDICAL HISTORY :  Past Medical History  Diagnosis Date  . Alcohol abuse   . Arthritis   . Depression   . Stroke   . Thyroid disease     hypothyroid   Dementia.    Hypertension.    Colon cancer.    PAST SURGICAL HISTORY: Past Surgical History  Procedure Laterality Date  . Cholecystectomy  2008  . Tonsillectomy and adenoidectomy    . Abdominal hysterectomy      partial  . Thyroid surgery     Colon surgery.    SOCIAL HISTORY:  reports that she has never smoked. She does not have any smokeless tobacco  history on file. MARITAL HISTORY:  The patient is a widow.   ALCOHOL:  Denies alcohol use.   ILLICIT DRUGS:  Denies illicit drug use.    FAMILY HISTORY:  Family History  Problem Relation Age of Onset  . Arthritis Mother   . Hyperlipidemia Mother   . Stroke Mother   . Hypertension Mother   . Mental illness Mother   . Cancer Father     lung  . Cancer Sister     ovarian/uterine  . Stroke Sister   . Hypertension Sister   . Hypertension Brother   . Diabetes Brother   . Cancer Daughter     breast  SIBLINGS:  Sister has thyroid disease.    CURRENT MEDICATIONS: Reviewed per [facility]  REVIEW OF SYSTEMS:  See HPI otherwise 14 point ROS is negative.  PHYSICAL EXAMINATION  VS:  T 96.6       P 78      RR 20      BP 130/75      POX 96% room air        WT (Lb)   GENERAL: no acute distress, normal body habitus EYES: conjunctivae normal, sclerae normal, normal eye lids MOUTH/THROAT: lips without lesions,no lesions in the mouth,tongue is without lesions,uvula elevates in midline NECK: supple, trachea midline, no neck masses, no thyroid tenderness, no thyromegaly LYMPHATICS: no LAN in the neck, no supraclavicular LAN RESPIRATORY: breathing  is even & unlabored, BS CTAB CARDIAC: RRR, no murmur,no extra heart sounds, no edema GI:  ABDOMEN: abdomen soft, normal BS, no masses, no tenderness  LIVER/SPLEEN: no hepatomegaly, no splenomegaly MUSCULOSKELETAL: HEAD: normal to inspection & palpation BACK: no kyphosis, scoliosis or spinal processes tenderness EXTREMITIES: LEFT UPPER EXTREMITY: strength intact, range of motion unable to assess   RIGHT UPPER EXTREMITY: strength intact, range of motion unable to assess    LEFT LOWER EXTREMITY: strength intact, range of motion unable to assess   RIGHT LOWER EXTREMITY: strength intact, range of motion unable to assess    PSYCHIATRIC: the patient is alert & confused, affect & behavior appropriate  LABS/RADIOLOGY: Glucose 103, otherwise BMP normal.      Hemoglobin 10.6, MCV 93.6, otherwise CBC normal.    ASSESSMENT/PLAN:  Left knee osteoarthritis.  Status post left knee MAKOplasty.  Continue rehabilitation.    Hypertension.  Well controlled.    Hypothyroidism.  Continue levothyroxine.    Acute blood loss anemia.  Reassess hemoglobin level.  Continue iron.    Constipation.  Well controlled.    Check CBC and BMP.    I have reviewed patient's medical records received at admission/from hospitalization.  CPT CODE: 14782

## 2013-08-11 ENCOUNTER — Ambulatory Visit (INDEPENDENT_AMBULATORY_CARE_PROVIDER_SITE_OTHER): Payer: Medicare Other | Admitting: Family Medicine

## 2013-08-11 ENCOUNTER — Encounter: Payer: Self-pay | Admitting: Family Medicine

## 2013-08-11 VITALS — BP 120/68 | HR 82 | Temp 97.7°F | Wt 114.0 lb

## 2013-08-11 DIAGNOSIS — R300 Dysuria: Secondary | ICD-10-CM

## 2013-08-11 LAB — POCT URINALYSIS DIPSTICK
Protein, UA: NEGATIVE
Urobilinogen, UA: 0.2

## 2013-08-11 MED ORDER — CIPROFLOXACIN HCL 250 MG PO TABS: 250.0000 mg | ORAL_TABLET | Freq: Two times a day (BID) | ORAL | Status: DC

## 2013-08-11 NOTE — Patient Instructions (Signed)
Urinary Tract Infection  Urinary tract infections (UTIs) can develop anywhere along your urinary tract. Your urinary tract is your body's drainage system for removing wastes and extra water. Your urinary tract includes two kidneys, two ureters, a bladder, and a urethra. Your kidneys are a pair of bean-shaped organs. Each kidney is about the size of your fist. They are located below your ribs, one on each side of your spine.  CAUSES  Infections are caused by microbes, which are microscopic organisms, including fungi, viruses, and bacteria. These organisms are so small that they can only be seen through a microscope. Bacteria are the microbes that most commonly cause UTIs.  SYMPTOMS   Symptoms of UTIs may vary by age and gender of the patient and by the location of the infection. Symptoms in young women typically include a frequent and intense urge to urinate and a painful, burning feeling in the bladder or urethra during urination. Older women and men are more likely to be tired, shaky, and weak and have muscle aches and abdominal pain. A fever may mean the infection is in your kidneys. Other symptoms of a kidney infection include pain in your back or sides below the ribs, nausea, and vomiting.  DIAGNOSIS  To diagnose a UTI, your caregiver will ask you about your symptoms. Your caregiver also will ask to provide a urine sample. The urine sample will be tested for bacteria and white blood cells. White blood cells are made by your body to help fight infection.  TREATMENT   Typically, UTIs can be treated with medication. Because most UTIs are caused by a bacterial infection, they usually can be treated with the use of antibiotics. The choice of antibiotic and length of treatment depend on your symptoms and the type of bacteria causing your infection.  HOME CARE INSTRUCTIONS   If you were prescribed antibiotics, take them exactly as your caregiver instructs you. Finish the medication even if you feel better after you  have only taken some of the medication.   Drink enough water and fluids to keep your urine clear or pale yellow.   Avoid caffeine, tea, and carbonated beverages. They tend to irritate your bladder.   Empty your bladder often. Avoid holding urine for long periods of time.   Empty your bladder before and after sexual intercourse.   After a bowel movement, women should cleanse from front to back. Use each tissue only once.  SEEK MEDICAL CARE IF:    You have back pain.   You develop a fever.   Your symptoms do not begin to resolve within 3 days.  SEEK IMMEDIATE MEDICAL CARE IF:    You have severe back pain or lower abdominal pain.   You develop chills.   You have nausea or vomiting.   You have continued burning or discomfort with urination.  MAKE SURE YOU:    Understand these instructions.   Will watch your condition.   Will get help right away if you are not doing well or get worse.  Document Released: 08/15/2005 Document Revised: 05/06/2012 Document Reviewed: 12/14/2011  [employer] Patient Information 2014 [employer].

## 2013-08-11 NOTE — Progress Notes (Signed)
  Subjective:    Patient ID: [name], female    DOB: May 28, 1931, 79 y.o.   MRN: [id number]  HPI Patient is seen with possible dysuria. History is difficult. She's had some discolored urine and initially stated mild burning but then refuted that. No fever. Possibly some mild frequency. No gross blood. She's had frequent UTIs in the past her most recent culture was multiple species. We suspect she may be colonized. She's had good appetite no recent nausea vomiting. No acute confusion.  Past Medical History  Diagnosis Date  . Alcohol abuse   . Arthritis   . Depression   . Stroke   . Thyroid disease     hypothyroid   Past Surgical History  Procedure Laterality Date  . Cholecystectomy  2008  . Tonsillectomy and adenoidectomy    . Abdominal hysterectomy      partial  . Thyroid surgery      reports that she has never smoked. She does not have any smokeless tobacco history on file. Her alcohol and drug histories are not on file. family history includes Arthritis in her mother; Cancer in her daughter, father, and sister; Diabetes in her brother; Hyperlipidemia in her mother; Hypertension in her brother, mother, and sister; Mental illness in her mother; Stroke in her mother and sister. Allergies  Allergen Reactions  . Citalopram Hydrobromide     Shaky inside  . Sulfa Antibiotics     rash      Review of Systems  Constitutional: Negative for fever, chills, appetite change and unexpected weight change.  Gastrointestinal: Negative for nausea, vomiting, abdominal pain, diarrhea and constipation.  Genitourinary: Negative for hematuria.  Musculoskeletal: Negative for back pain.  Neurological: Negative for dizziness.       Objective:   Physical Exam  Constitutional: She appears well-developed and well-nourished.  Cardiovascular: Normal rate and regular rhythm.   Pulmonary/Chest: Effort normal and breath sounds normal. No respiratory distress. She has no wheezes. She has no rales.   Musculoskeletal: She exhibits no edema.  Neurological: She is alert.          Assessment & Plan:  Dysuria. History from patient is somewhat difficult (poor historian). Equivocal history of burning with urination. No fever. High likelihood of some colonization. Recommend urine culture. No antibiotics at this point but if she develops any fever or new symptoms start Cipro 250 mg twice daily for 7 days. Flu vaccine is offered /recommended and she declines. She defers until next month

## 2013-08-15 LAB — URINE CULTURE: Colony Count: 70000

## 2013-09-16 ENCOUNTER — Encounter: Payer: Self-pay | Admitting: Family Medicine

## 2013-09-16 ENCOUNTER — Ambulatory Visit (INDEPENDENT_AMBULATORY_CARE_PROVIDER_SITE_OTHER): Payer: Medicare Other | Admitting: Family Medicine

## 2013-09-16 VITALS — BP 120/70 | HR 77 | Temp 97.7°F | Wt 114.0 lb

## 2013-09-16 DIAGNOSIS — N390 Urinary tract infection, site not specified: Secondary | ICD-10-CM

## 2013-09-16 DIAGNOSIS — F0391 Unspecified dementia with behavioral disturbance: Secondary | ICD-10-CM

## 2013-09-16 DIAGNOSIS — F03918 Unspecified dementia, unspecified severity, with other behavioral disturbance: Secondary | ICD-10-CM

## 2013-09-16 MED ORDER — NITROFURANTOIN MONOHYD MACRO 100 MG PO CAPS: 100.0000 mg | ORAL_CAPSULE | Freq: Two times a day (BID) | ORAL | Status: DC

## 2013-09-16 NOTE — Progress Notes (Signed)
  Subjective:    Patient ID: [name], female    DOB: [birth year], 79 y.o.   MRN: [id number]  HPI Here with Mindi Junker, her caretaker, for unclear reasons. She has a hx of frequent UTIs and she was treated by Dr. [name] last month with Cipro. Now for 2 days she has complained of urinary burning again. No fevers or nausea. Her caretaker says she has not been wearing her Depends pads like she is supposed to and she has been incontinent of urine and stool. She had 2 incontinent BMs this morning around her apartment. Her caretaker says she and the family are very concerned about the patient's dementia, which has been getting worse. She is very difficult to care for. She is having visual hallucinations. She does not sleep well or eat well. She lives at [facility].    Review of Systems  Constitutional: Negative.   Respiratory: Negative.   Cardiovascular: Negative.   Genitourinary: Positive for dysuria, urgency and frequency.  Neurological: Negative.   Psychiatric/Behavioral: Positive for hallucinations, behavioral problems and confusion. Negative for self-injury and agitation.       Objective:   Physical Exam  Constitutional:  Alert but very confused. Using her walker   Cardiovascular: Normal rate, regular rhythm, normal heart sounds and intact distal pulses.   Pulmonary/Chest: Effort normal and breath sounds normal.  Abdominal: Soft. Bowel sounds are normal. She exhibits no distension and no mass. There is no tenderness. There is no rebound and no guarding.  Neurological: She is alert.  Oriented only to self   Psychiatric: She has a normal mood and affect. Her behavior is normal.          Assessment & Plan:  She was unable to give [place] a urine sample today but it sounds like she has a UTI. Treat with Macrobid. She is clearly demented and has become difficult for the family to care for. I will discuss the case with Dr. [name], her PCP.

## 2013-10-07 ENCOUNTER — Telehealth: Payer: Self-pay | Admitting: Family Medicine

## 2013-10-07 NOTE — Telephone Encounter (Signed)
Decided to wait until Friday at 8:45

## 2013-10-07 NOTE — Telephone Encounter (Signed)
If another provider has a 8am appt and the pt wants that one that would be ok. If she can wait till the 9:45am appt that is fine to. Up to the patient daughter

## 2013-10-07 NOTE — Telephone Encounter (Signed)
Pt has poss uti. Pt is confused, even walking out in the cold w/out coat. Daughter was hoping to bring pt in to get urine tested or see the doctor first thing in the am. No appt except same day at 9:45am pls advise this evening. She will see someone else at 8 an appt if she can.

## 2013-10-09 ENCOUNTER — Ambulatory Visit (INDEPENDENT_AMBULATORY_CARE_PROVIDER_SITE_OTHER): Payer: Medicare Other | Admitting: Family Medicine

## 2013-10-09 ENCOUNTER — Encounter: Payer: Self-pay | Admitting: Family Medicine

## 2013-10-09 VITALS — BP 118/72 | HR 81 | Temp 97.7°F | Wt 111.0 lb

## 2013-10-09 DIAGNOSIS — E039 Hypothyroidism, unspecified: Secondary | ICD-10-CM

## 2013-10-09 DIAGNOSIS — N390 Urinary tract infection, site not specified: Secondary | ICD-10-CM

## 2013-10-09 DIAGNOSIS — R300 Dysuria: Secondary | ICD-10-CM

## 2013-10-09 DIAGNOSIS — Z23 Encounter for immunization: Secondary | ICD-10-CM

## 2013-10-09 DIAGNOSIS — R21 Rash and other nonspecific skin eruption: Secondary | ICD-10-CM

## 2013-10-09 LAB — POCT URINALYSIS DIPSTICK
Ketones, UA: NEGATIVE
Nitrite, UA: NEGATIVE
Protein, UA: NEGATIVE
pH, UA: 5.5

## 2013-10-09 LAB — TSH: TSH: 1.88 u[IU]/mL (ref 0.35–5.50)

## 2013-10-09 MED ORDER — CIPROFLOXACIN HCL 250 MG PO TABS: 250.0000 mg | ORAL_TABLET | Freq: Two times a day (BID) | ORAL | Status: DC

## 2013-10-09 MED ORDER — TRIAMCINOLONE ACETONIDE 0.1 % EX CREA: 1.0000 "application " | TOPICAL_CREAM | Freq: Two times a day (BID) | CUTANEOUS | Status: DC

## 2013-10-09 NOTE — Progress Notes (Signed)
  Subjective:    Patient ID: [name], female    DOB: 12-12-1930, 79 y.o.   MRN: [id number]  HPI History of recurrent UTI. Patient is accompanied by caregiver today. She is a very poor historian. She relates daily history of dark-colored urine and cloudy urine. She actually does not complain any burning at this time. No fevers or chills. She had multiple UTIs over the past 2 years most recently September when she grew out Escherichia coli and Pseudomonas both susceptible to Cipro. Her symptoms did improve after Cipro. She is supposed to be on trimethoprim 100 mg once daily for prophylaxis but not clear she is actually taking this. Her daughter is not here to confirm. Daughter had requested consideration for urology referral  Patient has hypothyroidism and is on thyroid replacement. She is due for repeat TSH. She complains of fatigue which is actually been very chronic for her. No recent appetite or weight changes.  Slightly pruritic scaly rash post to right ear.  Unknown duration.  She has not tried any topicals.  Past Medical History  Diagnosis Date  . Alcohol abuse   . Arthritis   . Depression   . Stroke   . Thyroid disease     hypothyroid   Past Surgical History  Procedure Laterality Date  . Cholecystectomy  2008  . Tonsillectomy and adenoidectomy    . Abdominal hysterectomy      partial  . Thyroid surgery      reports that she has never smoked. She has never used smokeless tobacco. She reports that she does not drink alcohol or use illicit drugs. family history includes Arthritis in her mother; Cancer in her daughter, father, and sister; Diabetes in her brother; Hyperlipidemia in her mother; Hypertension in her brother, mother, and sister; Mental illness in her mother; Stroke in her mother and sister. Allergies  Allergen Reactions  . Citalopram Hydrobromide     Shaky inside  . Sulfa Antibiotics     rash      Review of Systems  Constitutional: Positive for fatigue.   Eyes: Negative for visual disturbance.  Respiratory: Negative for cough, chest tightness, shortness of breath and wheezing.   Cardiovascular: Negative for chest pain, palpitations and leg swelling.  Endocrine: Negative for polydipsia and polyuria.  Genitourinary: Positive for dysuria.  Neurological: Negative for dizziness, seizures, syncope, weakness, light-headedness and headaches.       Objective:   Physical Exam  Constitutional: She appears well-developed and well-nourished.  Neck: Neck supple. No thyromegaly present.  Cardiovascular: Normal rate and regular rhythm.   Pulmonary/Chest: Effort normal and breath sounds normal. No respiratory distress. She has no wheezes. She has no rales.  Musculoskeletal: She exhibits no edema.  Neurological: She is alert.  Skin: Rash noted.  Posterior to right ear patient has about 1 cm diameter area of rash with erythematous base and scaly surface.  Psychiatric: She has a normal mood and affect. Her behavior is normal.          Assessment & Plan:  #1 dysuria. She's has history of recurrent UTI. Current urine dipstick revealed leukocytes otherwise negative. Obtain urine culture. Cipro 250 mg twice a day for 7 days pending culture results. Consider urology referral #2 hypothyroidism. Recheck TSH #3 health maintenance. Flu vaccine given. Cannot confirm prior Pneumovax. This will be given today. #4 eczematous rash posterior to ear.  Triamcinolone cream to use BID prn.

## 2013-10-09 NOTE — Progress Notes (Signed)
Pre visit review using our clinic review tool, if applicable. No additional management support is needed unless otherwise documented below in the visit note. 

## 2013-10-09 NOTE — Patient Instructions (Signed)
Urinary Tract Infection  Urinary tract infections (UTIs) can develop anywhere along your urinary tract. Your urinary tract is your body's drainage system for removing wastes and extra water. Your urinary tract includes two kidneys, two ureters, a bladder, and a urethra. Your kidneys are a pair of bean-shaped organs. Each kidney is about the size of your fist. They are located below your ribs, one on each side of your spine.  CAUSES  Infections are caused by microbes, which are microscopic organisms, including fungi, viruses, and bacteria. These organisms are so small that they can only be seen through a microscope. Bacteria are the microbes that most commonly cause UTIs.  SYMPTOMS   Symptoms of UTIs may vary by age and gender of the patient and by the location of the infection. Symptoms in young women typically include a frequent and intense urge to urinate and a painful, burning feeling in the bladder or urethra during urination. Older women and men are more likely to be tired, shaky, and weak and have muscle aches and abdominal pain. A fever may mean the infection is in your kidneys. Other symptoms of a kidney infection include pain in your back or sides below the ribs, nausea, and vomiting.  DIAGNOSIS  To diagnose a UTI, your caregiver will ask you about your symptoms. Your caregiver also will ask to provide a urine sample. The urine sample will be tested for bacteria and white blood cells. White blood cells are made by your body to help fight infection.  TREATMENT   Typically, UTIs can be treated with medication. Because most UTIs are caused by a bacterial infection, they usually can be treated with the use of antibiotics. The choice of antibiotic and length of treatment depend on your symptoms and the type of bacteria causing your infection.  HOME CARE INSTRUCTIONS   If you were prescribed antibiotics, take them exactly as your caregiver instructs you. Finish the medication even if you feel better after you  have only taken some of the medication.   Drink enough water and fluids to keep your urine clear or pale yellow.   Avoid caffeine, tea, and carbonated beverages. They tend to irritate your bladder.   Empty your bladder often. Avoid holding urine for long periods of time.   Empty your bladder before and after sexual intercourse.   After a bowel movement, women should cleanse from front to back. Use each tissue only once.  SEEK MEDICAL CARE IF:    You have back pain.   You develop a fever.   Your symptoms do not begin to resolve within 3 days.  SEEK IMMEDIATE MEDICAL CARE IF:    You have severe back pain or lower abdominal pain.   You develop chills.   You have nausea or vomiting.   You have continued burning or discomfort with urination.  MAKE SURE YOU:    Understand these instructions.   Will watch your condition.   Will get help right away if you are not doing well or get worse.  Document Released: 08/15/2005 Document Revised: 05/06/2012 Document Reviewed: 12/14/2011  [employer] Patient Information 2014 [employer].

## 2013-10-21 ENCOUNTER — Ambulatory Visit (INDEPENDENT_AMBULATORY_CARE_PROVIDER_SITE_OTHER): Payer: Medicare Other | Admitting: Family Medicine

## 2013-10-21 ENCOUNTER — Encounter: Payer: Self-pay | Admitting: Family Medicine

## 2013-10-21 VITALS — BP 128/60 | Temp 98.1°F | Wt 108.0 lb

## 2013-10-21 DIAGNOSIS — N390 Urinary tract infection, site not specified: Secondary | ICD-10-CM

## 2013-10-21 DIAGNOSIS — L259 Unspecified contact dermatitis, unspecified cause: Secondary | ICD-10-CM

## 2013-10-21 DIAGNOSIS — L309 Dermatitis, unspecified: Secondary | ICD-10-CM

## 2013-10-21 DIAGNOSIS — L853 Xerosis cutis: Secondary | ICD-10-CM

## 2013-10-21 DIAGNOSIS — L738 Other specified follicular disorders: Secondary | ICD-10-CM

## 2013-10-21 NOTE — Patient Instructions (Signed)
-  use cerave cream daily (not lotion), do not use other moisturizers  -wash only with dove or aveeno hypoallergenic soap with no color or odor  -use ALL hypoallergenic laundry detergent  -can use the steroid cream on itchy areas 1-2 times daily for 1 week - avoid using on face or in creases  -use humidifier  -your urology appointment in on December 29th at 11am at [facility] urology with Dr. Margarita Grizzle

## 2013-10-21 NOTE — Progress Notes (Signed)
Chief Complaint  Patient presents with  . Rash    itchy    HPI:  [name], an 79 yo patient of Dr. [name], is here for an cute visit for:  Rash: -seen recently by PCP and treated with steroid cream for eczematous rash -noticed this morning -very itchy, itchy skin in the past, place she lives is very dry -no fevers, malaise, pain -can't think of new exposures or medications -[place] body wash, tide laundry detergent, suave lotion   Hx UTI: -recently finished course of abx (cipro) and on trimpex chronically forrecurrent UTIs -feels much better but has chronic  mild symptoms of frequency, urgency and a little dysuria -denies: fevers, hematuria, flank, NV -reports referred to urologist for recurrent UTI, but has not hear about this appt and wonderswhen this is or if needs to check urine again  ROS: See pertinent positives and negatives per HPI.  Past Medical History  Diagnosis Date  . Alcohol abuse   . Arthritis   . Depression   . Stroke   . Thyroid disease     hypothyroid    Past Surgical History  Procedure Laterality Date  . Cholecystectomy  2008  . Tonsillectomy and adenoidectomy    . Abdominal hysterectomy      partial  . Thyroid surgery      Family History  Problem Relation Age of Onset  . Arthritis Mother   . Hyperlipidemia Mother   . Stroke Mother   . Hypertension Mother   . Mental illness Mother   . Cancer Father     lung  . Cancer Sister     ovarian/uterine  . Stroke Sister   . Hypertension Sister   . Hypertension Brother   . Diabetes Brother   . Cancer Daughter     breast    History   Social History  . Marital Status: Widowed    Spouse Name: N/A    Number of Children: N/A  . Years of Education: N/A   Social History Main Topics  . Smoking status: Never Smoker   . Smokeless tobacco: Never Used  . Alcohol Use: No  . Drug Use: No  . Sexual Activity: None   Other Topics Concern  . None   Social History Narrative  . None     Current outpatient prescriptions:acetaminophen (TYLENOL) 650 MG CR tablet, Take 325 mg by mouth 2 (two) times daily. , Disp: , Rfl: ;  aspirin 81 MG tablet, Take 81 mg by mouth daily., Disp: , Rfl: ;  folic acid (FOLVITE) 1 MG tablet, Take 1 mg by mouth daily.  , Disp: , Rfl: ;  levothyroxine (SYNTHROID, LEVOTHROID) 112 MCG tablet, Take 112 mcg by mouth daily after breakfast., Disp: , Rfl:  mirtazapine (REMERON) 7.5 MG tablet, Take 1 tablet (7.5 mg total) by mouth at bedtime., Disp: 30 tablet, Rfl: 3;  Multiple Vitamin (MULTIVITAMIN WITH MINERALS) TABS, Take 1 tablet by mouth daily., Disp: , Rfl: ;  thiamine 100 MG tablet, Take 100 mg by mouth daily.  , Disp: , Rfl: ;  triamcinolone cream (KENALOG) 0.1 %, Apply 1 application topically 2 (two) times daily., Disp: 30 g, Rfl: 1 trimethoprim (TRIMPEX) 100 MG tablet, Take 1 tablet (100 mg total) by mouth at bedtime., Disp: 30 tablet, Rfl: 5  EXAM:  Filed Vitals:   10/21/13 1523  BP: 128/60  Temp: 98.1 F (36.7 C)    Body mass index is 21.06 kg/(m^2).  GENERAL: vitals reviewed and listed above, alert, oriented,  appears well hydrated and in no acute distress  HEENT: atraumatic, conjunttiva clear, no obvious abnormalities on inspection of external nose and ears  NECK: no obvious masses on inspection  SKIN: patchy dry skin on L arm and on upper back  ABD: soft, NTTP, no CVA TTP  MS: moves all extremities without noticeable abnormality  PSYCH: pleasant and cooperative, no obvious depression or anxiety  ASSESSMENT AND PLAN:  Discussed the following assessment and plan:  Eczema  Dry skin  Recurrent UTI  -for skin, looks eczematous - advised per instructions, if persists or worsens she is to follow up with PCP, discussed that trimpex can sometimes cause a rash but this looks less like a drug rash. -will check urine culture today, provided them with the details of the urology appointment which was scheduled but they were not aware of,  return precuations -follow up with PCP in 2-4 weeks or sooner if needed -Patient advised to return or notify a doctor immediately if symptoms worsen or persist or new concerns arise.  Patient Instructions  -use cerave cream daily (not lotion), do not use other moisturizers  -wash only with dove or aveeno hypoallergenic soap with no color or odor  -use ALL hypoallergenic laundry detergent  -can use the steroid cream on itchy areas 1-2 times daily for 1 week - avoid using on face or in creases  -use humidifier  -your urology appointment in on December 29th at 11am at [facility] urology with Dr. Ilda Mori, Damita Lack.

## 2013-10-21 NOTE — Progress Notes (Signed)
Pre visit review using our clinic review tool, if applicable. No additional management support is needed unless otherwise documented below in the visit note. 

## 2013-10-22 LAB — URINE CULTURE: Organism ID, Bacteria: NO GROWTH

## 2013-10-23 ENCOUNTER — Ambulatory Visit (INDEPENDENT_AMBULATORY_CARE_PROVIDER_SITE_OTHER): Payer: Medicare Other | Admitting: Family Medicine

## 2013-10-23 ENCOUNTER — Encounter: Payer: Self-pay | Admitting: Family Medicine

## 2013-10-23 VITALS — BP 110/64 | Temp 98.0°F

## 2013-10-23 DIAGNOSIS — N390 Urinary tract infection, site not specified: Secondary | ICD-10-CM

## 2013-10-23 DIAGNOSIS — L500 Allergic urticaria: Secondary | ICD-10-CM

## 2013-10-23 MED ORDER — METHYLPREDNISOLONE ACETATE 80 MG/ML IJ SUSP
120.0000 mg | Freq: Once | INTRAMUSCULAR | Status: AC
  Administered 2013-10-23: 120 mg via INTRAMUSCULAR

## 2013-10-23 NOTE — Addendum Note (Signed)
Addended by: Aniceto Boss A on: 10/23/2013 04:55 PM   Modules accepted: Orders

## 2013-10-23 NOTE — Progress Notes (Signed)
Pre visit review using our clinic review tool, if applicable. No additional management support is needed unless otherwise documented below in the visit note. 

## 2013-10-23 NOTE — Progress Notes (Signed)
   Subjective:    Patient ID: [name], female    DOB: 08-28-1931, 79 y.o.   MRN: [id number]  HPI Here for an itchy rash all over her trunk which started about a week ago. No swelling or SOB. She has chronic UTIs and has taken Trimethaprim for years as a maintenance med. Then several weeks ago she was diagnosed with another UTI and was given a course of Cipro. She finished this earlier this week. When she was here 2 days she had a urine culture sent which showed no growth. The rash is spreading and causes intense itching. Using a moisturizer cream only.    Review of Systems  Constitutional: Negative.   Respiratory: Negative.   Cardiovascular: Negative.   Genitourinary: Negative.   Skin: Positive for rash.       Objective:   Physical Exam  Constitutional: She appears well-developed and well-nourished. No distress.  Cardiovascular: Normal rate, regular rhythm, normal heart sounds and intact distal pulses.   Pulmonary/Chest: Effort normal and breath sounds normal.  Skin:  Diffuse urticarial rash over the trunk           Assessment & Plan:  This is hives, most likely a reaction to her recent antibiotics. Advised her to stop the Trimethaprim for now. She will see Dr. Margarita Grizzle on 11-16-13 and they can address whether she needs daily meds or not. As for the hives, she is given a steroid shot today, and they will use Claritin 10 mg bid and Zantac 75 mg bid for a week or so until the rash subsides.

## 2013-10-26 ENCOUNTER — Ambulatory Visit (INDEPENDENT_AMBULATORY_CARE_PROVIDER_SITE_OTHER): Payer: Medicare Other | Admitting: Family Medicine

## 2013-10-26 ENCOUNTER — Encounter: Payer: Self-pay | Admitting: Family Medicine

## 2013-10-26 VITALS — BP 128/70 | HR 84 | Temp 97.4°F | Wt 108.0 lb

## 2013-10-26 DIAGNOSIS — G459 Transient cerebral ischemic attack, unspecified: Secondary | ICD-10-CM

## 2013-10-26 LAB — LIPID PANEL
Cholesterol: 224 mg/dL — ABNORMAL HIGH (ref 0–200)
Total CHOL/HDL Ratio: 3
Triglycerides: 61 mg/dL (ref 0.0–149.0)

## 2013-10-26 LAB — CBC WITH DIFFERENTIAL/PLATELET
Basophils Absolute: 0.1 10*3/uL (ref 0.0–0.1)
Basophils Relative: 0.4 % (ref 0.0–3.0)
Eosinophils Absolute: 0.1 10*3/uL (ref 0.0–0.7)
Lymphocytes Relative: 8.3 % — ABNORMAL LOW (ref 12.0–46.0)
MCHC: 33.4 g/dL (ref 30.0–36.0)
Monocytes Absolute: 1.1 10*3/uL — ABNORMAL HIGH (ref 0.1–1.0)
Neutrophils Relative %: 82.1 % — ABNORMAL HIGH (ref 43.0–77.0)
Platelets: 290 10*3/uL (ref 150.0–400.0)
RBC: 4.17 Mil/uL (ref 3.87–5.11)
RDW: 13.5 % (ref 11.5–14.6)

## 2013-10-26 LAB — BASIC METABOLIC PANEL
BUN: 12 mg/dL (ref 6–23)
CO2: 30 mEq/L (ref 19–32)
Calcium: 9 mg/dL (ref 8.4–10.5)
Creatinine, Ser: 0.6 mg/dL (ref 0.4–1.2)

## 2013-10-26 LAB — LDL CHOLESTEROL, DIRECT: Direct LDL: 135.7 mg/dL

## 2013-10-26 NOTE — Progress Notes (Signed)
Pre visit review using our clinic review tool, if applicable. No additional management support is needed unless otherwise documented below in the visit note. 

## 2013-10-26 NOTE — Progress Notes (Signed)
   Subjective:    Patient ID: [name], female    DOB: [birth year], 79 y.o.   MRN: [id number]  HPI Is seen as a work in today with possible TIA yesterday. Back in March of 2012 she had admission for TIA. At that time she had MRI scan of the brain which showed no acute findings. She had CT angiogram which showed 54% left ICA and 50% right ICA stenosis. She's not had any symptoms until yesterday with regard to the TIA-type symptoms. She has some baseline dementia which is gradually worsening.  Patient is a poor historian. Her caregiver was not with her yesterday but apparently she had some slurred speech and possible facial droop unknown duration but apparently cleared after couple hours. They do not describe any focal weakness and she's had none of those symptoms today. She does take baby aspirin one daily. Recent TSH normal. Recent UTI symptomatically improved following antibiotics.  Patient is a nonsmoker. She has history of good blood pressure control. She has not had lipids in over one year. No history of diabetes  Past Medical History  Diagnosis Date  . Alcohol abuse   . Arthritis   . Depression   . Stroke   . Thyroid disease     hypothyroid   Past Surgical History  Procedure Laterality Date  . Cholecystectomy  2008  . Tonsillectomy and adenoidectomy    . Abdominal hysterectomy      partial  . Thyroid surgery      reports that she has never smoked. She has never used smokeless tobacco. She reports that she does not drink alcohol or use illicit drugs. family history includes Arthritis in her mother; Cancer in her daughter, father, and sister; Diabetes in her brother; Hyperlipidemia in her mother; Hypertension in her brother, mother, and sister; Mental illness in her mother; Stroke in her mother and sister. Allergies  Allergen Reactions  . Citalopram Hydrobromide     Shaky inside  . Sulfa Antibiotics     rash      Review of Systems  Constitutional: Negative for fatigue.    HENT: Negative for trouble swallowing.   Eyes: Negative for visual disturbance.  Respiratory: Negative for cough, chest tightness, shortness of breath and wheezing.   Cardiovascular: Negative for chest pain, palpitations and leg swelling.  Neurological: Positive for facial asymmetry (yesterday). Negative for dizziness, seizures, syncope, weakness, light-headedness and headaches.       Objective:   Physical Exam  Constitutional: She is oriented to person, place, and time. She appears well-developed and well-nourished.  HENT:  Mouth/Throat: Oropharynx is clear and moist.  Eyes: Pupils are equal, round, and reactive to light.  Neck: Neck supple.  No carotid bruits  Cardiovascular: Normal rate and regular rhythm.   Pulmonary/Chest: Effort normal and breath sounds normal. No respiratory distress. She has no wheezes. She has no rales.  Musculoskeletal: She exhibits no edema.  Neurological: She is alert and oriented to person, place, and time. No cranial nerve deficit. Coordination normal.  No focal weakness. She has good grip strengths bilaterally. Normal gag reflex.    Psychiatric: She has a normal mood and affect. Her behavior is normal.          Assessment & Plan:  Probable TIA yesterday by description. Symptoms fully cleared today. Repeat carotid Dopplers. Continue aspirin. Blood pressure is well controlled. Repeat lipids along with CBC and basic metabolic panel. Follow up immediately for any recurrent symptoms

## 2013-10-26 NOTE — Patient Instructions (Addendum)
Transient Ischemic Attack A transient ischemic attack (TIA) is a "warning stroke" that causes stroke-like symptoms. Unlike a stroke, a TIA does not cause permanent damage to the brain. The symptoms of a TIA can happen very fast and do not last long. It is important to know the symptoms of a TIA and what to do. This can help prevent a major stroke or death. CAUSES   A TIA is caused by a temporary blockage in an artery in the brain or neck (carotid artery). The blockage does not allow the brain to get the blood supply it needs and can cause different symptoms. The blockage can be caused by either:  A blood clot.  Fatty buildup (plaque) in a neck or brain artery. RISK FACTORS  High blood pressure (hypertension).  High cholesterol.  Diabetes mellitus.  Heart disease.  The build up of plaque in the blood vessels (peripheral artery disease or atherosclerosis).  The build up of plaque in the blood vessels providing blood and oxygen to the brain (carotid artery stenosis).  An abnormal heart rhythm (atrial fibrillation).  Obesity.  Smoking.  Taking oral contraceptives (especially in combination with smoking).  Physical inactivity.  A diet high in fats, salt (sodium), and calories.  Alcohol use.  Use of illegal drugs (especially cocaine and methamphetamine).  Being female.  Being African American.  Being over the age of 70.  Family history of stroke.  Previous history of blood clots, stroke, TIA, or heart attack.  Sickle cell disease. SYMPTOMS  TIA symptoms are the same as a stroke but are temporary. These symptoms usually develop suddenly, or may be newly present upon awakening from sleep:  Sudden weakness or numbness of the face, arm, or leg, especially on one side of the body.  Sudden trouble walking or difficulty moving arms or legs.  Sudden confusion.  Sudden personality changes.  Trouble speaking (aphasia) or understanding.  Difficulty swallowing.  Sudden  trouble seeing in one or both eyes.  Double vision.  Dizziness.  Loss of balance or coordination.  Sudden severe headache with no known cause.  Trouble reading or writing.  Loss of bowel or bladder control.  Loss of consciousness. DIAGNOSIS  Your caregiver may be able to determine the presence or absence of a TIA based on your symptoms, history, and physical exam. Computed tomography (CT scan) of the brain is usually performed to help identify a TIA. Other tests may be done to diagnose a TIA. These tests may include:  Electrocardiography.  Continuous heart monitoring.  Echocardiography.  Carotid ultrasonography.  Magnetic resonance imaging (MRI).  A scan of the brain circulation.  Blood tests. PREVENTION  The risk of a TIA can be decreased by appropriately treating high blood pressure, high cholesterol, diabetes, heart disease, and obesity and by quitting smoking, limiting alcohol, and staying physically active. TREATMENT  Time is of the essence. Since the symptoms of TIA are the same as a stroke, it is important to seek treatment within 3 4 hours of the start of symptoms because you may receive a medicine to dissolve the clot (thrombolytic) that cannot be given after that time. Treatment options vary. Treatment options may include rest, oxygen, intravenous (IV) fluids, and medicines to thin the blood (anticoagulants). Medicines and diet may be used to address diabetes, high blood pressure, and other risk factors. Measures will be taken to prevent short-term and long-term complications, including infection from breathing foreign material into the lungs (aspiration pneumonia), blood clots in the legs, and falls. Treatment options  include procedures to either remove plaque in the carotid arteries or dilate carotid arteries that have narrowed due to plaque. Those procedures are:  Carotid endarterectomy.  Carotid angioplasty and stenting. HOME CARE INSTRUCTIONS   Take all  medicines prescribed by your caregiver. Follow the directions carefully. Medicines may be used to control risk factors for a stroke. Be sure you understand all your medicine instructions.  You may be told to take aspirin or the anticoagulant warfarin. Warfarin needs to be taken exactly as instructed.  Taking too much or too little warfarin is dangerous. Too much warfarin increases the risk of bleeding. Too little warfarin continues to allow the risk for blood clots. While taking warfarin, you will need to have regular blood tests to measure your blood clotting time. A PT blood test measures how long it takes for blood to clot. Your PT is used to calculate another value called an INR. Your PT and INR help your caregiver to adjust your dose of warfarin. The dose can change for many reasons. It is critically important that you take warfarin exactly as prescribed.  Many foods, especially foods high in vitamin K can interfere with warfarin and affect the PT and INR. Foods high in vitamin K include spinach, kale, broccoli, cabbage, collard and turnip greens, brussels sprouts, peas, cauliflower, seaweed, and parsley as well as beef and pork liver, green tea, and soybean oil. You should eat a consistent amount of foods high in vitamin K. Avoid major changes in your diet, or notify your caregiver before changing your diet. Arrange a visit with a dietitian to answer your questions.  Many medicines can interfere with warfarin and affect the PT and INR. You must tell your caregiver about any and all medicines you take, this includes all vitamins and supplements. Be especially cautious with aspirin and anti-inflammatory medicines. Do not take or discontinue any prescribed or over-the-counter medicine except on the advice of your caregiver or pharmacist.  Warfarin can have side effects, such as excessive bruising or bleeding. You will need to hold pressure over cuts for longer than usual. Your caregiver or pharmacist  will discuss other potential side effects.  Avoid sports or activities that may cause injury or bleeding.  Be mindful when shaving, flossing your teeth, or handling sharp objects.  Alcohol can change the body's ability to handle warfarin. It is best to avoid alcoholic drinks or consume only very small amounts while taking warfarin. Notify your caregiver if you change your alcohol intake.  Notify your dentist or other caregivers before procedures.  Eat a diet that includes 5 or more servings of fruits and vegetables each day. This may reduce the risk of stroke. Certain diets may be prescribed to address high blood pressure, high cholesterol, diabetes, or obesity.  A low-sodium, low-saturated fat, low-trans fat, low-cholesterol diet is recommended to manage high blood pressure.  A low-saturated fat, low-trans fat, low-cholesterol, and high-fiber diet may control cholesterol levels.  A controlled-carbohydrate, controlled-sugar diet is recommended to manage diabetes.  A reduced-calorie, low-sodium, low-saturated fat, low-trans fat, low-cholesterol diet is recommended to manage obesity.  Maintain a healthy weight.  Stay physically active. It is recommended that you get at least 30 minutes of activity on most or all days.  Do not smoke.  Limit alcohol use even if you are not taking warfarin. Moderate alcohol use is considered to be:  No more than 2 drinks each day for men.  No more than 1 drink each day for nonpregnant women.  Stop  drug abuse.  Home safety. A safe home environment is important to reduce the risk of falls. Your caregiver may arrange for specialists to evaluate your home. Having grab bars in the bedroom and bathroom is often important. Your caregiver may arrange for equipment to be used at home, such as raised toilets and a seat for the shower.  Follow all instructions for follow-up with your caregiver. This is very important. This includes any referrals and lab tests.  Proper follow up can prevent a stroke or another TIA from occurring. SEEK MEDICAL CARE IF:  You have personality changes.  You have difficulty swallowing.  You are seeing double.  You have dizziness.  You have a fever.  You have skin breakdown. SEEK IMMEDIATE MEDICAL CARE IF:  Any of these symptoms may represent a serious problem that is an emergency. Do not wait to see if the symptoms will go away. Get medical help right away. Call your local emergency services (911 in [place] Do not drive yourself to the hospital.  You have sudden weakness or numbness of the face, arm, or leg, especially on one side of the body.  You have sudden trouble walking or difficulty moving arms or legs.  You have sudden confusion.  You have trouble speaking (aphasia) or understanding.  You have sudden trouble seeing in one or both eyes.  You have a loss of balance or coordination.  You have a sudden, severe headache with no known cause.  You have new chest pain or an irregular heartbeat.  You have a partial or total loss of consciousness. MAKE SURE YOU:   Understand these instructions.  Will watch your condition.  Will get help right away if you are not doing well or get worse. Document Released: 08/15/2005 Document Revised: 10/22/2012 Document Reviewed: 12/29/2009 [facility] Patient Information 2014 [place], [place].  We will call you regarding carotid doppler. Continue with daily aspirin. Follow up immediately for any recurrent facial weakness, slurred speech, confusion, or any focal weakness.

## 2013-10-30 ENCOUNTER — Encounter (HOSPITAL_COMMUNITY): Payer: Self-pay | Admitting: Cardiovascular Disease

## 2013-10-30 ENCOUNTER — Encounter (HOSPITAL_COMMUNITY): Payer: Medicare Other

## 2013-10-30 DIAGNOSIS — R0989 Other specified symptoms and signs involving the circulatory and respiratory systems: Secondary | ICD-10-CM

## 2013-11-04 ENCOUNTER — Encounter (HOSPITAL_COMMUNITY): Payer: Medicare Other

## 2013-11-05 ENCOUNTER — Ambulatory Visit (HOSPITAL_COMMUNITY): Payer: Medicare Other | Attending: Cardiology

## 2013-11-05 ENCOUNTER — Encounter: Payer: Self-pay | Admitting: Cardiology

## 2013-11-05 DIAGNOSIS — R4789 Other speech disturbances: Secondary | ICD-10-CM | POA: Insufficient documentation

## 2013-11-05 DIAGNOSIS — E785 Hyperlipidemia, unspecified: Secondary | ICD-10-CM | POA: Insufficient documentation

## 2013-11-05 DIAGNOSIS — I658 Occlusion and stenosis of other precerebral arteries: Secondary | ICD-10-CM | POA: Insufficient documentation

## 2013-11-05 DIAGNOSIS — Z8673 Personal history of transient ischemic attack (TIA), and cerebral infarction without residual deficits: Secondary | ICD-10-CM | POA: Insufficient documentation

## 2013-11-05 DIAGNOSIS — I6529 Occlusion and stenosis of unspecified carotid artery: Secondary | ICD-10-CM | POA: Insufficient documentation

## 2013-11-05 DIAGNOSIS — G459 Transient cerebral ischemic attack, unspecified: Secondary | ICD-10-CM

## 2014-01-08 ENCOUNTER — Encounter: Payer: Self-pay | Admitting: Family Medicine

## 2014-01-08 ENCOUNTER — Telehealth: Payer: Self-pay | Admitting: Family Medicine

## 2014-01-08 ENCOUNTER — Ambulatory Visit (INDEPENDENT_AMBULATORY_CARE_PROVIDER_SITE_OTHER): Payer: Commercial Managed Care - HMO | Admitting: Family Medicine

## 2014-01-08 VITALS — BP 110/68 | HR 80 | Wt 115.0 lb

## 2014-01-08 DIAGNOSIS — R300 Dysuria: Secondary | ICD-10-CM | POA: Diagnosis not present

## 2014-01-08 LAB — POCT URINALYSIS DIPSTICK
BILIRUBIN UA: NEGATIVE
GLUCOSE UA: NEGATIVE
KETONES UA: NEGATIVE
Nitrite, UA: POSITIVE
Protein, UA: NEGATIVE
SPEC GRAV UA: 1.015
Urobilinogen, UA: 0.2
pH, UA: 6.5

## 2014-01-08 MED ORDER — CIPROFLOXACIN HCL 250 MG PO TABS: 250.0000 mg | ORAL_TABLET | Freq: Two times a day (BID) | ORAL | Status: DC

## 2014-01-08 MED ORDER — CEPHALEXIN 500 MG PO CAPS: 500.0000 mg | ORAL_CAPSULE | Freq: Three times a day (TID) | ORAL | Status: DC

## 2014-01-08 NOTE — Patient Instructions (Signed)
Urinary Tract Infection  Urinary tract infections (UTIs) can develop anywhere along your urinary tract. Your urinary tract is your body's drainage system for removing wastes and extra water. Your urinary tract includes two kidneys, two ureters, a bladder, and a urethra. Your kidneys are a pair of bean-shaped organs. Each kidney is about the size of your fist. They are located below your ribs, one on each side of your spine.  CAUSES  Infections are caused by microbes, which are microscopic organisms, including fungi, viruses, and bacteria. These organisms are so small that they can only be seen through a microscope. Bacteria are the microbes that most commonly cause UTIs.  SYMPTOMS   Symptoms of UTIs may vary by age and gender of the patient and by the location of the infection. Symptoms in young women typically include a frequent and intense urge to urinate and a painful, burning feeling in the bladder or urethra during urination. Older women and men are more likely to be tired, shaky, and weak and have muscle aches and abdominal pain. A fever may mean the infection is in your kidneys. Other symptoms of a kidney infection include pain in your back or sides below the ribs, nausea, and vomiting.  DIAGNOSIS  To diagnose a UTI, your caregiver will ask you about your symptoms. Your caregiver also will ask to provide a urine sample. The urine sample will be tested for bacteria and white blood cells. White blood cells are made by your body to help fight infection.  TREATMENT   Typically, UTIs can be treated with medication. Because most UTIs are caused by a bacterial infection, they usually can be treated with the use of antibiotics. The choice of antibiotic and length of treatment depend on your symptoms and the type of bacteria causing your infection.  HOME CARE INSTRUCTIONS   If you were prescribed antibiotics, take them exactly as your caregiver instructs you. Finish the medication even if you feel better after you  have only taken some of the medication.   Drink enough water and fluids to keep your urine clear or pale yellow.   Avoid caffeine, tea, and carbonated beverages. They tend to irritate your bladder.   Empty your bladder often. Avoid holding urine for long periods of time.   Empty your bladder before and after sexual intercourse.   After a bowel movement, women should cleanse from front to back. Use each tissue only once.  SEEK MEDICAL CARE IF:    You have back pain.   You develop a fever.   Your symptoms do not begin to resolve within 3 days.  SEEK IMMEDIATE MEDICAL CARE IF:    You have severe back pain or lower abdominal pain.   You develop chills.   You have nausea or vomiting.   You have continued burning or discomfort with urination.  MAKE SURE YOU:    Understand these instructions.   Will watch your condition.   Will get help right away if you are not doing well or get worse.  Document Released: 08/15/2005 Document Revised: 05/06/2012 Document Reviewed: 12/14/2011  [employer] Patient Information 2014 [employer].

## 2014-01-08 NOTE — Telephone Encounter (Signed)
Rx sent to pharmacy and pt Daughter is aware

## 2014-01-08 NOTE — Telephone Encounter (Signed)
Pt broke out in a rash all over her body.

## 2014-01-08 NOTE — Telephone Encounter (Signed)
Start Keflex 500 mg po tid for 7 days

## 2014-01-08 NOTE — Progress Notes (Signed)
   Subjective:    Patient ID: [name], female    DOB: Feb 23, 1931, 80 y.o.   MRN: [id number]  Dysuria  Pertinent negatives include no chills, frequency, hematuria, nausea or vomiting.   Patient has dementia among other chronic problems. She is seen today with caregiver with concern for possible UTI. Those she does not complaining of any dysuria or frequency her daughter has noted that her urine appears to be a" darker" than usual. She has also been slightly more confused off and on. She's had multiple urinary tract infections in the past with multiple different pathogens. At one point earlier this year she was receiving intramuscular gentamicin per urology for pseudomonas infection. She does not have any nausea or vomiting. She has sulfa allergy.  Past Medical History  Diagnosis Date  . Alcohol abuse   . Arthritis   . Depression   . Stroke   . Thyroid disease     hypothyroid   Past Surgical History  Procedure Laterality Date  . Cholecystectomy  2008  . Tonsillectomy and adenoidectomy    . Abdominal hysterectomy      partial  . Thyroid surgery      reports that she has never smoked. She has never used smokeless tobacco. She reports that she does not drink alcohol or use illicit drugs. family history includes Arthritis in her mother; Cancer in her daughter, father, and sister; Diabetes in her brother; Hyperlipidemia in her mother; Hypertension in her brother, mother, and sister; Mental illness in her mother; Stroke in her mother and sister. Allergies  Allergen Reactions  . Citalopram Hydrobromide     Shaky inside  . Sulfa Antibiotics     rash      Review of Systems  Constitutional: Negative for fever, chills and appetite change.  Gastrointestinal: Negative for nausea, vomiting, abdominal pain, diarrhea and constipation.  Genitourinary: Negative for dysuria, frequency and hematuria.  Musculoskeletal: Negative for back pain.  Neurological: Negative for dizziness.         Objective:   Physical Exam  Constitutional: She appears well-developed and well-nourished.  HENT:  Head: Normocephalic and atraumatic.  Neck: Neck supple. No thyromegaly present.  Cardiovascular: Normal rate, regular rhythm and normal heart sounds.   Pulmonary/Chest: Breath sounds normal.  Abdominal: Soft. Bowel sounds are normal. There is no tenderness.          Assessment & Plan:  Probable recurrent UTI. Urine culture sent. Cipro 250 mg twice daily for 7 days pending culture results. Caregiver is encouraged to make sure she is staying adequately hydrated

## 2014-01-08 NOTE — Telephone Encounter (Signed)
Left message for patient to return call.

## 2014-01-08 NOTE — Progress Notes (Signed)
Pre visit review using our clinic review tool, if applicable. No additional management support is needed unless otherwise documented below in the visit note. 

## 2014-01-08 NOTE — Telephone Encounter (Signed)
Pt daughter is calling to let md know the abx he just prescribed her mother is allergic to abx. Please call daughter back.[facility].

## 2014-01-11 LAB — URINE CULTURE

## 2014-03-11 ENCOUNTER — Ambulatory Visit: Payer: Medicare HMO | Admitting: Infectious Diseases

## 2014-03-24 ENCOUNTER — Ambulatory Visit: Payer: Medicare HMO | Admitting: Internal Medicine

## 2014-03-28 ENCOUNTER — Other Ambulatory Visit: Payer: Self-pay | Admitting: Family Medicine

## 2014-04-13 ENCOUNTER — Telehealth: Payer: Self-pay | Admitting: Family Medicine

## 2014-04-13 NOTE — Telephone Encounter (Signed)
[name] from [facility] call to say that they need a order to continue giving patient her antibiotic. The current order say family members may give shot but she has none that come in for them to show how to do. Home health has been giving the shot and nee a order that say skill nursing can continue to give shot until 04/16/14

## 2014-04-13 NOTE — Telephone Encounter (Signed)
Please clarify what she is getting (which antibiotic).  If home health simply needs order to given OK to approve.  Who ordered her antibiotic?

## 2014-04-13 NOTE — Telephone Encounter (Signed)
Left message for [name] to call back.  

## 2014-06-07 ENCOUNTER — Telehealth: Payer: Self-pay | Admitting: Family Medicine

## 2014-06-07 NOTE — Telephone Encounter (Signed)
Caitlyn (RN) with [facility] called, today 06/07/14 is her last scheduled visit with pt.  Please return Caitlyn's call re: need for additional Home Health for [employer].  Best number  [phone] / lt

## 2014-06-07 NOTE — Telephone Encounter (Signed)
Okay to set up additional home health visits

## 2014-06-08 NOTE — Telephone Encounter (Signed)
Left message for patient to return call.

## 2014-06-08 NOTE — Telephone Encounter (Signed)
Caitlyn is informed.

## 2014-08-17 ENCOUNTER — Telehealth: Payer: Self-pay | Admitting: Family Medicine

## 2014-08-17 NOTE — Telephone Encounter (Signed)
Noted  

## 2014-08-17 NOTE — Telephone Encounter (Signed)
Patient Information:  Caller Name: [name]  Phone: [phone]  Patient: [name]  Gender: Female  DOB: [birth year]  Age: 80 Years  PCP: Carolann Littler [facility])  Office Follow Up:  Does the office need to follow up with this patient?: No  Instructions For The Office: N/A  RN Note:  Daughter advised to take her mother to the ED tonight (08/17/14) if SOB at rest. Daughter agrees. Pt. with caregiver now. Appt. made for 08/18/14 in the office if daughter feels like she is not in any acute distress. Daughter to call and cancel the appt. if she goes to the ED tonight.  Symptoms  Reason For Call & Symptoms: Many issues with bladder. Went to [facility]. Has hx of Dementia. Caregiver has also noticed that the left foot is swollen. States this is normal due to her bladder problem. Today(08/17/14) is complaining of SOB on exertion (pt. up walking).Confused. Not on antibiotics now for her bladder. Daughter is not with the pt. now and not sure if SOB at rest. States no hx of heart problems.  Reviewed Health History In EMR: Yes  Reviewed Medications In EMR: Yes  Reviewed Allergies In EMR: Yes  Reviewed Surgeries / Procedures: Yes  Date of Onset of Symptoms: 08/17/2014  Guideline(s) Used:  Breathing Difficulty  Disposition Per Guideline:   Go to Office Now  Reason For Disposition Reached:   Mild difficulty breathing (e.g., minimal/no SOB at rest, SOB with walking, pulse < 100) of new onset or worse than normal  Advice Given:  Call Back If:  Severe difficulty breathing occurs  You become worse.  Patient Will Follow Care Advice:  YES  Appointment Scheduled:  08/18/2014 08:45:00 Appointment Scheduled Provider:  Carolann Littler [facility])

## 2014-08-18 ENCOUNTER — Encounter: Payer: Self-pay | Admitting: Family Medicine

## 2014-08-18 ENCOUNTER — Ambulatory Visit (INDEPENDENT_AMBULATORY_CARE_PROVIDER_SITE_OTHER): Payer: Medicare HMO | Admitting: Family Medicine

## 2014-08-18 VITALS — BP 120/66 | HR 84 | Temp 97.7°F | Wt 118.0 lb

## 2014-08-18 DIAGNOSIS — R600 Localized edema: Secondary | ICD-10-CM

## 2014-08-18 DIAGNOSIS — R609 Edema, unspecified: Secondary | ICD-10-CM

## 2014-08-18 DIAGNOSIS — R0600 Dyspnea, unspecified: Secondary | ICD-10-CM

## 2014-08-18 DIAGNOSIS — E038 Other specified hypothyroidism: Secondary | ICD-10-CM

## 2014-08-18 DIAGNOSIS — R0989 Other specified symptoms and signs involving the circulatory and respiratory systems: Secondary | ICD-10-CM

## 2014-08-18 DIAGNOSIS — R0609 Other forms of dyspnea: Secondary | ICD-10-CM

## 2014-08-18 LAB — CBC WITH DIFFERENTIAL/PLATELET
BASOS ABS: 0 10*3/uL (ref 0.0–0.1)
Basophils Relative: 0.4 % (ref 0.0–3.0)
EOS PCT: 1.7 % (ref 0.0–5.0)
Eosinophils Absolute: 0.1 10*3/uL (ref 0.0–0.7)
HCT: 36.6 % (ref 36.0–46.0)
HEMOGLOBIN: 12.2 g/dL (ref 12.0–15.0)
LYMPHS PCT: 12.5 % (ref 12.0–46.0)
Lymphs Abs: 1 10*3/uL (ref 0.7–4.0)
MCHC: 33.4 g/dL (ref 30.0–36.0)
MCV: 90.7 fl (ref 78.0–100.0)
MONOS PCT: 8.3 % (ref 3.0–12.0)
Monocytes Absolute: 0.6 10*3/uL (ref 0.1–1.0)
NEUTROS ABS: 6.1 10*3/uL (ref 1.4–7.7)
Neutrophils Relative %: 77.1 % — ABNORMAL HIGH (ref 43.0–77.0)
Platelets: 360 10*3/uL (ref 150.0–400.0)
RBC: 4.04 Mil/uL (ref 3.87–5.11)
RDW: 13.6 % (ref 11.5–15.5)
WBC: 7.9 10*3/uL (ref 4.0–10.5)

## 2014-08-18 LAB — BASIC METABOLIC PANEL
BUN: 11 mg/dL (ref 6–23)
CHLORIDE: 101 meq/L (ref 96–112)
CO2: 25 meq/L (ref 19–32)
CREATININE: 0.6 mg/dL (ref 0.4–1.2)
Calcium: 9.1 mg/dL (ref 8.4–10.5)
GFR: 103.79 mL/min (ref >60.00)
Glucose, Bld: 98 mg/dL (ref 70–99)
Potassium: 4.8 mEq/L (ref 3.5–5.1)
Sodium: 134 mEq/L — ABNORMAL LOW (ref 135–145)

## 2014-08-18 LAB — TSH: TSH: 2.58 u[IU]/mL (ref 0.35–4.50)

## 2014-08-18 LAB — BRAIN NATRIURETIC PEPTIDE: Pro B Natriuretic peptide (BNP): 87 pg/mL (ref 0.0–100.0)

## 2014-08-18 NOTE — Progress Notes (Signed)
Subjective:    Patient ID: [name], female    DOB: 12-25-1930, 80 y.o.   MRN: [id number]  Shortness of Breath Associated symptoms include leg swelling. Pertinent negatives include no abdominal pain, chest pain, fever or wheezing.   Patient is an 80 year old female with a history of dementia, past history of alcohol abuse, hypothyroidism, history of depression who is a very poor historian. She is accompanied by caregiver today. Apparently said some increased shortness of breath over the past week or so which was observed when she was ambulating. There is no dyspnea note at rest and she denies any orthopnea. No cough. No fever. No chest pain. No pleuritic pain. No history of known heart failure. She has some chronic lower extremity edema left greater than right. Previous left knee surgery. Did notice some mild increased swelling left leg and ankle compared to right.  Patient did fall apparently weeks ago with some left buttock pain but ambulating without much difficulty. No hip pain.  She does ambulate with a walker. Caregiver legs that she's had more shuffling gait recently. She's had progressive cognitive decline in recent years. No recent complaints of dysuria. No fevers or chills.  Past Medical History  Diagnosis Date  . Alcohol abuse   . Arthritis   . Depression   . Stroke   . Thyroid disease     hypothyroid   Past Surgical History  Procedure Laterality Date  . Cholecystectomy  2008  . Tonsillectomy and adenoidectomy    . Abdominal hysterectomy      partial  . Thyroid surgery      reports that she has never smoked. She has never used smokeless tobacco. She reports that she does not drink alcohol or use illicit drugs. family history includes Arthritis in her mother; Cancer in her daughter, father, and sister; Diabetes in her brother; Hyperlipidemia in her mother; Hypertension in her brother, mother, and sister; Mental illness in her mother; Stroke in her mother and  sister. Allergies  Allergen Reactions  . Citalopram Hydrobromide     Shaky inside  . Sulfa Antibiotics     rash  . Ciprofloxacin Rash      Review of Systems  Constitutional: Negative for fever and chills.  Respiratory: Positive for shortness of breath. Negative for cough and wheezing.   Cardiovascular: Positive for leg swelling. Negative for chest pain and palpitations.  Gastrointestinal: Negative for abdominal pain.  Genitourinary: Negative for dysuria.  Neurological: Negative for syncope.  Psychiatric/Behavioral: Positive for confusion.       Objective:   Physical Exam  Constitutional: She appears well-developed and well-nourished.  Patient has minimal facial expression. She is alert and cooperative  Neck: Neck supple.  Cardiovascular: Normal rate.  Exam reveals no gallop.   Pulmonary/Chest: Effort normal and breath sounds normal. No respiratory distress. She has no wheezes. She has no rales.  Musculoskeletal: She exhibits edema.  Trace nonpitting edema ankles bilaterally left greater than right. She is very trace edema lower legs.  Neurological: She is alert. No cranial nerve deficit.  Ambulates with walker with somewhat short shuffling gait.          Assessment & Plan:  #1 dyspnea. This is subjective. No respiratory distress.She has O2 sats 98%. No evidence for infectious respiratory syndrome. No evidence for overt heart failure. Nothing clinically to suggest likely DVT/PE though she is fairly sedentary. To check labs with BNP, d-dimer, CBC. #2 hypothyroidism. Recheck TSH #3 patient presents with facial inexpression, rigidity of extremities and  shuffling gait. Question Parkinson's.  She has minimal tremor which is exacerbated by anxiousness. We'll discuss with daughter whether to pursue neurologic evaluation.

## 2014-08-18 NOTE — Progress Notes (Signed)
Pre visit review using our clinic review tool, if applicable. No additional management support is needed unless otherwise documented below in the visit note. 

## 2014-08-23 ENCOUNTER — Telehealth: Payer: Self-pay | Admitting: Family Medicine

## 2014-08-23 DIAGNOSIS — R269 Unspecified abnormalities of gait and mobility: Secondary | ICD-10-CM

## 2014-08-23 NOTE — Telephone Encounter (Signed)
Daughter waiting on results from parkinson test done last week. pls call

## 2014-08-23 NOTE — Telephone Encounter (Signed)
We did not do any "Parkinsons tests"    We were doing labs to assess her dyspnea.  All OK but did not see D-dimer back yet.  Check on that to see why not back..  I had suggested to her care giver, that we might want them to see Dr Tat (neurology)to assess since she had some findings POSSIBLY suggesting Parkinson's

## 2014-08-23 NOTE — Telephone Encounter (Signed)
Pt would like a referral.

## 2014-08-23 NOTE — Telephone Encounter (Signed)
Let daughter know have set up appt with Dr Carles Collet

## 2014-08-23 NOTE — Telephone Encounter (Signed)
Left message for patient to return call.

## 2014-08-24 NOTE — Telephone Encounter (Signed)
Pt daughter is aware that she should be receiving a call.

## 2014-09-16 ENCOUNTER — Telehealth: Payer: Self-pay | Admitting: Family Medicine

## 2014-09-16 NOTE — Telephone Encounter (Signed)
Pt saw urologist in [place] and md to pt she has hemorrhoids and need something strong than otc preparation [place]

## 2014-09-16 NOTE — Telephone Encounter (Signed)
Evaluate here

## 2014-09-17 ENCOUNTER — Telehealth: Payer: Self-pay | Admitting: *Deleted

## 2014-09-17 ENCOUNTER — Encounter: Payer: Self-pay | Admitting: Neurology

## 2014-09-17 ENCOUNTER — Ambulatory Visit (INDEPENDENT_AMBULATORY_CARE_PROVIDER_SITE_OTHER): Payer: Commercial Managed Care - HMO | Admitting: Neurology

## 2014-09-17 ENCOUNTER — Telehealth: Payer: Self-pay | Admitting: Neurology

## 2014-09-17 VITALS — BP 140/68 | HR 80 | Ht 62.0 in | Wt 118.0 lb

## 2014-09-17 DIAGNOSIS — R9402 Abnormal brain scan: Secondary | ICD-10-CM

## 2014-09-17 DIAGNOSIS — F0150 Vascular dementia without behavioral disturbance: Secondary | ICD-10-CM

## 2014-09-17 DIAGNOSIS — G214 Vascular parkinsonism: Secondary | ICD-10-CM

## 2014-09-17 NOTE — Telephone Encounter (Signed)
Lm on kim clark vm for pt to sch appt

## 2014-09-17 NOTE — Telephone Encounter (Signed)
[facility] will be going out to the home to consult with patient on monday

## 2014-09-17 NOTE — Progress Notes (Signed)
[name] was seen today in the movement disorders clinic for neurologic consultation at the request of [name], MD.  The consultation is for the evaluation of shuffling gait and to r/o PD.  Pt has a hx of significant memory change and hx of EtOH abuse.  She is accompanied by her caregiver from [facility] who supplements the hx.  The records that were made available to me were reviewed.  Shuffling is intermittent and has been going on "for a while."    Specific Symptoms:  Tremor: Yes.   in the jaw Voice: no change per pt (very soft spoken per pt) Sleep: sleeps well  Vivid Dreams:  No.  Acting out dreams:  Yes.   Wet Pillows: No. Postural symptoms:  Yes.    Falls?  Yes.   (usually when no one is there; caregivers there from 7:30am-9pm) Bradykinesia symptoms: shuffling gait, slow movements, slowed thought processes and difficulty getting out of a chair Loss of smell:  No. Loss of taste:  No. Urinary Incontinence:  Yes.   Difficulty Swallowing:  No. Handwriting, micrographia: No. Trouble with ADL's:  Yes.   (gets assist with ADL's)  Trouble buttoning clothing: Yes.   Depression:  Yes.   Memory changes:  Yes.   Hallucinations:  Yes.    visual distortions: Yes.   N/V:  No. Lightheaded:  Yes.    Syncope: No. Diplopia:  No. Dyskinesia:  No.  Neuroimaging has  previously been performed.  It is available for my review today.  CT of the brain was performed in May, 2014.  There is evidence of very significant small vessel disease.  PREVIOUS MEDICATIONS: none to date  ALLERGIES:   Allergies  Allergen Reactions  . Citalopram Hydrobromide     Shaky inside  . Sulfa Antibiotics     rash  . Ciprofloxacin Rash    CURRENT MEDICATIONS:  Outpatient Encounter Prescriptions as of 09/17/2014  Medication Sig  . acetaminophen (TYLENOL) 650 MG CR tablet Take 325 mg by mouth 2 (two) times daily.   . clopidogrel (PLAVIX) 75 MG tablet Take 75 mg by mouth daily.   Marland Kitchen  levothyroxine (SYNTHROID, LEVOTHROID) 112 MCG tablet TAKE 1 TABLET (112 MCG TOTAL) BY MOUTH DAILY.  . Multiple Vitamin (MULTIVITAMIN WITH MINERALS) TABS Take 1 tablet by mouth daily.  . Probiotic Product (ACIDOPHILUS/GOAT MILK) CAPS Take by mouth.  . [DISCONTINUED] folic acid (FOLVITE) 1 MG tablet Take 1 mg by mouth daily.    . [DISCONTINUED] loratadine (CLARITIN) 5 MG chewable tablet Chew 5 mg by mouth daily.   . [DISCONTINUED] thiamine 100 MG tablet Take 100 mg by mouth daily.    . [DISCONTINUED] triamcinolone cream (KENALOG) 0.1 % Apply 1 application topically 2 (two) times daily.    PAST MEDICAL HISTORY:   Past Medical History  Diagnosis Date  . Alcohol abuse   . Arthritis   . Depression   . Stroke   . Thyroid disease     hypothyroid    PAST SURGICAL HISTORY:   Past Surgical History  Procedure Laterality Date  . Cholecystectomy  2008  . Tonsillectomy and adenoidectomy    . Abdominal hysterectomy      partial  . Thyroid surgery      SOCIAL HISTORY:   History   Social History  . Marital Status: Widowed    Spouse Name: N/A    Number of Children: N/A  . Years of Education: N/A   Occupational History  . Not on file.  Social History Main Topics  . Smoking status: Never Smoker   . Smokeless tobacco: Never Used  . Alcohol Use: No     Comment: used to drink   . Drug Use: No  . Sexual Activity: Not on file   Other Topics Concern  . Not on file   Social History Narrative  . No narrative on file    FAMILY HISTORY:   Family Status  Relation Status Death Age  . Mother Deceased     stroke   . Father Deceased     lung cancer  . Sister Deceased     ovarian cancer  . Sister Deceased     stroke  . Sister Alive   . Sister Alive   . Sister Alive   . Brother Alive   . Brother Alive   . Daughter Alive     breast cancer  . Daughter Alive     healthy    ROS:  A complete 10 system review of systems was obtained and was unremarkable apart from what is  mentioned above.  PHYSICAL EXAMINATION:    VITALS:   Filed Vitals:   09/17/14 0912  BP: 140/68  Pulse: 80  Height: 5\' 2"  (1.575 m)  Weight: 118 lb (53.524 kg)    GEN:  The patient appears stated age and is in NAD. HEENT:  Normocephalic, atraumatic.  The mucous membranes are moist. The superficial temporal arteries are without ropiness or tenderness. CV:  RRR Lungs:  CTAB Neck/HEME:  There are no carotid bruits bilaterally.  Neurological examination:  Orientation: A complete MoCA was performed today.  The results are below.  The patient is incredibly repetitive.  She is agitated at times.  She is not oriented to date or year, stating that it is 65. Montreal Cognitive Assessment  09/17/2014  Visuospatial/ Executive (0/5) 0  Naming (0/3) 2  Attention: Read list of digits (0/2) 1  Attention: Read list of letters (0/1) 0  Attention: Serial 7 subtraction starting at 100 (0/3) 0  Language: Repeat phrase (0/2) 0  Language : Fluency (0/1) 0  Abstraction (0/2) 0  Delayed Recall (0/5) 0  Orientation (0/6) 4  Total 7  Adjusted Score (based on education) 7    Cranial nerves: There is good facial symmetry.  There is facial hypomimia.  Pupils are equal round and reactive to light bilaterally. Fundoscopic exam reveals clear margins bilaterally. Extraocular muscles are intact. The visual fields are full to confrontational testing. The speech is fluent and clear. Soft palate rises symmetrically and there is no tongue deviation. Hearing is intact to conversational tone. Sensation: Sensation is intact to light and pinprick throughout (facial, trunk, extremities). Vibration is intact at the bilateral big toe but she has difficulty telling when it stops.. There is no extinction with double simultaneous stimulation. There is no sensory dermatomal level identified. Motor: Strength is 5/5 in the bilateral upper and lower extremities with the exception of shoulder abduction bilaterally and strength  there is 3/5.   Shoulder shrug is equal and symmetric.  There is no pronator drift. Deep tendon reflexes: Deep tendon reflexes are 2/4 at the bilateral biceps, triceps, brachioradialis, patella and one over 4 at the bilateral achilles. Plantar responses are downgoing bilaterally.  Movement examination: Tone: There is normal tone in the right upper extremity.  There is significant paratonia in the left upper extremity, and it is difficult to tell whether there is any true rigidity or if it is all gegenhalten.  Tone in  the lower extremities is normal. Abnormal movements: None Coordination:  There is no true decremation with rapid alternating movements, but she is slow with virtually all forms of rapid alternating movements and is also somewhat apraxic when performing rapid alternating movements Gait and Station: The patient has significant difficulty arising out of a deep-seated chair without the use of the hands.  She actually rocks out of the chair even when using her hands.  The patient's stride length is decreased, even when using the walker.  She only shuffles mildly when using the walker.  She does turn en bloc.    ASSESSMENT/PLAN:  1.  Parkinsonism, suspect vascular in nature.  -I. had a long discussion with the patient and her caregiver.  I suspect that this is not idiopathic Parkinson's disease but rather vascular parkinsonism.  Only about a third of patients with vascular parkinsonism will respond to levodopa.  Generally, however, I do try most patients with vascular parkinsonism on levodopa to see if they will respond.  However, I am somewhat reluctant to try her on levodopa right now given her profound memory loss and the fact that she lives alone.  She does have daytime caregivers, but just am not sure if the risk is worth it right now.  In addition, her daughter did not attend the visit.  In the end, we decided not to add levodopa, but instead to start physical therapy with care [place]. 2.   profound dementia, likely vascular.  Patient also has a history of alcohol abuse, but is no longer drinking.  -The patient does have home instead during the daytime hours, but lives in independent assisted living at [facility] and has no one with her at night.  I absolutely think that she needs 24-hour per day care and should not under any circumstance be living alone.  Much greater than 50% of the 60 min visit was spent discussing this with her and her caregiver today.  Her caregiver was going to relay the message to the daughter.  I also asked that her daughter attend the next appointment.  Her daughter is a Education officer, museum, so we made the next appointment during Christmas break.

## 2014-09-17 NOTE — Telephone Encounter (Signed)
Referral to [facility] for Parkinson's Program PT faxed to [phone] with confirmation received. They will contact patient. Made aware to contact daughter [name] for set up at [phone].

## 2014-09-17 NOTE — Telephone Encounter (Signed)
Can you please set up a visit for patient.

## 2014-09-17 NOTE — Patient Instructions (Signed)
1. We have referred you to [facility] for physical therapy. They will contact you directly to set up care. They can be reached at [phone]. 2. You need to have 24 hour care.

## 2014-09-21 NOTE — Telephone Encounter (Signed)
Lm on kim clark vm

## 2014-09-22 NOTE — Telephone Encounter (Signed)
Pt has been sch

## 2014-09-23 ENCOUNTER — Encounter (HOSPITAL_COMMUNITY): Payer: Self-pay | Admitting: Emergency Medicine

## 2014-09-23 ENCOUNTER — Emergency Department (HOSPITAL_COMMUNITY): Payer: Medicare HMO

## 2014-09-23 ENCOUNTER — Inpatient Hospital Stay (HOSPITAL_COMMUNITY)
Admission: EM | Admit: 2014-09-23 | Discharge: 2014-09-28 | DRG: 470 | Disposition: A | Discharge status: Home Health | Payer: Medicare HMO | Attending: Internal Medicine | Admitting: Internal Medicine

## 2014-09-23 DIAGNOSIS — Z888 Allergy status to other drugs, medicaments and biological substances status: Secondary | ICD-10-CM

## 2014-09-23 DIAGNOSIS — I10 Essential (primary) hypertension: Secondary | ICD-10-CM | POA: Diagnosis present

## 2014-09-23 DIAGNOSIS — Z833 Family history of diabetes mellitus: Secondary | ICD-10-CM | POA: Diagnosis not present

## 2014-09-23 DIAGNOSIS — F0150 Vascular dementia without behavioral disturbance: Secondary | ICD-10-CM | POA: Diagnosis present

## 2014-09-23 DIAGNOSIS — Y92018 Other place in single-family (private) house as the place of occurrence of the external cause: Secondary | ICD-10-CM

## 2014-09-23 DIAGNOSIS — B965 Pseudomonas (aeruginosa) (mallei) (pseudomallei) as the cause of diseases classified elsewhere: Secondary | ICD-10-CM | POA: Diagnosis not present

## 2014-09-23 DIAGNOSIS — W010XXA Fall on same level from slipping, tripping and stumbling without subsequent striking against object, initial encounter: Secondary | ICD-10-CM | POA: Diagnosis present

## 2014-09-23 DIAGNOSIS — M25552 Pain in left hip: Secondary | ICD-10-CM | POA: Diagnosis present

## 2014-09-23 DIAGNOSIS — I4510 Unspecified right bundle-branch block: Secondary | ICD-10-CM | POA: Diagnosis present

## 2014-09-23 DIAGNOSIS — Z8249 Family history of ischemic heart disease and other diseases of the circulatory system: Secondary | ICD-10-CM | POA: Diagnosis not present

## 2014-09-23 DIAGNOSIS — F32A Depression, unspecified: Secondary | ICD-10-CM | POA: Diagnosis present

## 2014-09-23 DIAGNOSIS — G214 Vascular parkinsonism: Secondary | ICD-10-CM | POA: Diagnosis present

## 2014-09-23 DIAGNOSIS — R52 Pain, unspecified: Secondary | ICD-10-CM

## 2014-09-23 DIAGNOSIS — F0280 Dementia in other diseases classified elsewhere without behavioral disturbance: Secondary | ICD-10-CM | POA: Diagnosis present

## 2014-09-23 DIAGNOSIS — Z79899 Other long term (current) drug therapy: Secondary | ICD-10-CM

## 2014-09-23 DIAGNOSIS — Z803 Family history of malignant neoplasm of breast: Secondary | ICD-10-CM | POA: Diagnosis not present

## 2014-09-23 DIAGNOSIS — R339 Retention of urine, unspecified: Secondary | ICD-10-CM | POA: Diagnosis present

## 2014-09-23 DIAGNOSIS — F329 Major depressive disorder, single episode, unspecified: Secondary | ICD-10-CM | POA: Diagnosis present

## 2014-09-23 DIAGNOSIS — E039 Hypothyroidism, unspecified: Secondary | ICD-10-CM | POA: Diagnosis present

## 2014-09-23 DIAGNOSIS — Z8673 Personal history of transient ischemic attack (TIA), and cerebral infarction without residual deficits: Secondary | ICD-10-CM | POA: Diagnosis not present

## 2014-09-23 DIAGNOSIS — Z801 Family history of malignant neoplasm of trachea, bronchus and lung: Secondary | ICD-10-CM

## 2014-09-23 DIAGNOSIS — M62838 Other muscle spasm: Secondary | ICD-10-CM | POA: Diagnosis not present

## 2014-09-23 DIAGNOSIS — M1612 Unilateral primary osteoarthritis, left hip: Secondary | ICD-10-CM | POA: Diagnosis present

## 2014-09-23 DIAGNOSIS — F0390 Unspecified dementia without behavioral disturbance: Secondary | ICD-10-CM | POA: Diagnosis present

## 2014-09-23 DIAGNOSIS — I672 Cerebral atherosclerosis: Secondary | ICD-10-CM | POA: Diagnosis present

## 2014-09-23 DIAGNOSIS — Z823 Family history of stroke: Secondary | ICD-10-CM | POA: Diagnosis not present

## 2014-09-23 DIAGNOSIS — Z7902 Long term (current) use of antithrombotics/antiplatelets: Secondary | ICD-10-CM | POA: Diagnosis not present

## 2014-09-23 DIAGNOSIS — G20 Parkinson's disease: Secondary | ICD-10-CM | POA: Diagnosis present

## 2014-09-23 DIAGNOSIS — D62 Acute posthemorrhagic anemia: Secondary | ICD-10-CM | POA: Diagnosis not present

## 2014-09-23 DIAGNOSIS — S72012A Unspecified intracapsular fracture of left femur, initial encounter for closed fracture: Secondary | ICD-10-CM | POA: Diagnosis present

## 2014-09-23 DIAGNOSIS — N390 Urinary tract infection, site not specified: Secondary | ICD-10-CM | POA: Diagnosis not present

## 2014-09-23 DIAGNOSIS — Z8041 Family history of malignant neoplasm of ovary: Secondary | ICD-10-CM | POA: Diagnosis not present

## 2014-09-23 DIAGNOSIS — Z419 Encounter for procedure for purposes other than remedying health state, unspecified: Secondary | ICD-10-CM

## 2014-09-23 DIAGNOSIS — Z96642 Presence of left artificial hip joint: Secondary | ICD-10-CM

## 2014-09-23 DIAGNOSIS — Z881 Allergy status to other antibiotic agents status: Secondary | ICD-10-CM

## 2014-09-23 DIAGNOSIS — F03C0 Unspecified dementia, severe, without behavioral disturbance, psychotic disturbance, mood disturbance, and anxiety: Secondary | ICD-10-CM | POA: Diagnosis present

## 2014-09-23 DIAGNOSIS — S72002A Fracture of unspecified part of neck of left femur, initial encounter for closed fracture: Secondary | ICD-10-CM | POA: Diagnosis present

## 2014-09-23 LAB — CBC
HCT: 35.9 % — ABNORMAL LOW (ref 36.0–46.0)
Hemoglobin: 12.1 g/dL (ref 12.0–15.0)
MCH: 30.3 pg (ref 26.0–34.0)
MCHC: 33.7 g/dL (ref 30.0–36.0)
MCV: 89.8 fL (ref 78.0–100.0)
PLATELETS: 298 10*3/uL (ref 150–400)
RBC: 4 MIL/uL (ref 3.87–5.11)
RDW: 13.1 % (ref 11.5–15.5)
WBC: 7.1 10*3/uL (ref 4.0–10.5)

## 2014-09-23 LAB — COMPREHENSIVE METABOLIC PANEL
ALBUMIN: 3.6 g/dL (ref 3.5–5.2)
ALT: 14 U/L (ref 0–35)
AST: 22 U/L (ref 0–37)
Alkaline Phosphatase: 88 U/L (ref 39–117)
Anion gap: 12 (ref 5–15)
BUN: 12 mg/dL (ref 6–23)
CALCIUM: 9.1 mg/dL (ref 8.4–10.5)
CO2: 25 meq/L (ref 19–32)
CREATININE: 0.54 mg/dL (ref 0.50–1.10)
Chloride: 95 mEq/L — ABNORMAL LOW (ref 96–112)
GFR calc Af Amer: >90 mL/min (ref >90)
GFR, EST NON AFRICAN AMERICAN: 86 mL/min — AB (ref >90)
Glucose, Bld: 100 mg/dL — ABNORMAL HIGH (ref 70–99)
Potassium: 4.2 mEq/L (ref 3.7–5.3)
SODIUM: 132 meq/L — AB (ref 137–147)
TOTAL PROTEIN: 7.2 g/dL (ref 6.0–8.3)
Total Bilirubin: 0.3 mg/dL (ref 0.3–1.2)

## 2014-09-23 LAB — PROTIME-INR
INR: 1.05 (ref 0.00–1.49)
PROTHROMBIN TIME: 13.8 s (ref 11.6–15.2)

## 2014-09-23 LAB — TYPE AND SCREEN
ABO/RH(D): O NEG
Antibody Screen: NEGATIVE

## 2014-09-23 LAB — ABO/RH: ABO/RH(D): O NEG

## 2014-09-23 MED ORDER — ONDANSETRON HCL 4 MG/2ML IJ SOLN
4.0000 mg | Freq: Four times a day (QID) | INTRAMUSCULAR | Status: DC | PRN
  Filled 2014-09-23: qty 2

## 2014-09-23 MED ORDER — HYDROMORPHONE HCL 1 MG/ML IJ SOLN
0.5000 mg | INTRAMUSCULAR | Status: DC | PRN
  Administered 2014-09-24 – 2014-09-25 (×4): 0.5 mg via INTRAVENOUS
  Filled 2014-09-23 (×5): qty 1

## 2014-09-23 MED ORDER — LEVOTHYROXINE SODIUM 112 MCG PO TABS
112.0000 ug | ORAL_TABLET | Freq: Every day | ORAL | Status: DC
  Administered 2014-09-24 – 2014-09-28 (×5): 112 ug via ORAL
  Filled 2014-09-23 (×7): qty 1

## 2014-09-23 MED ORDER — ONDANSETRON HCL 4 MG/2ML IJ SOLN
4.0000 mg | Freq: Once | INTRAMUSCULAR | Status: DC
  Filled 2014-09-23: qty 2

## 2014-09-23 MED ORDER — MORPHINE SULFATE 2 MG/ML IJ SOLN
2.0000 mg | INTRAMUSCULAR | Status: DC | PRN
  Administered 2014-09-23: 2 mg via INTRAVENOUS
  Administered 2014-09-24: 1 mg via INTRAVENOUS
  Administered 2014-09-24: 2 mg via INTRAVENOUS
  Filled 2014-09-23 (×3): qty 1

## 2014-09-23 MED ORDER — HYDROCODONE-ACETAMINOPHEN 5-325 MG PO TABS: 1.0000 | ORAL_TABLET | Freq: Four times a day (QID) | ORAL | Status: DC | PRN

## 2014-09-23 MED ORDER — HYDROCODONE-ACETAMINOPHEN 5-325 MG PO TABS
1.0000 | ORAL_TABLET | ORAL | Status: DC | PRN
  Administered 2014-09-24 – 2014-09-25 (×2): 1 via ORAL
  Filled 2014-09-23: qty 1

## 2014-09-23 MED ORDER — ONDANSETRON HCL 4 MG PO TABS: 4.0000 mg | ORAL_TABLET | Freq: Four times a day (QID) | ORAL | Status: DC | PRN

## 2014-09-23 MED ORDER — HYDROMORPHONE HCL 1 MG/ML IJ SOLN
0.5000 mg | INTRAMUSCULAR | Status: DC | PRN
Start: 2014-09-23 — End: 2014-09-23

## 2014-09-23 MED ORDER — ONDANSETRON HCL 4 MG/2ML IJ SOLN
4.0000 mg | Freq: Three times a day (TID) | INTRAMUSCULAR | Status: DC | PRN
Start: 2014-09-23 — End: 2014-09-23

## 2014-09-23 MED ORDER — MORPHINE SULFATE 4 MG/ML IJ SOLN
4.0000 mg | Freq: Once | INTRAMUSCULAR | Status: DC
  Filled 2014-09-23: qty 1

## 2014-09-23 MED ORDER — SODIUM CHLORIDE 0.9 % IV SOLN
INTRAVENOUS | Status: DC
  Administered 2014-09-23 – 2014-09-24 (×3): via INTRAVENOUS

## 2014-09-23 MED ORDER — SENNA 8.6 MG PO TABS: 1.0000 | ORAL_TABLET | Freq: Two times a day (BID) | ORAL | Status: DC

## 2014-09-23 MED ORDER — ONDANSETRON HCL 4 MG/2ML IJ SOLN
4.0000 mg | Freq: Three times a day (TID) | INTRAMUSCULAR | Status: AC | PRN
Start: 2014-09-23 — End: 2014-09-24

## 2014-09-23 MED ORDER — ACETAMINOPHEN 325 MG PO TABS: 650.0000 mg | ORAL_TABLET | Freq: Four times a day (QID) | ORAL | Status: DC | PRN

## 2014-09-23 MED ORDER — ADULT MULTIVITAMIN W/MINERALS CH
1.0000 | ORAL_TABLET | Freq: Every day | ORAL | Status: DC
  Administered 2014-09-23 – 2014-09-28 (×5): 1 via ORAL
  Filled 2014-09-23 (×6): qty 1

## 2014-09-23 MED ORDER — HYDROCODONE-ACETAMINOPHEN 5-325 MG PO TABS
1.0000 | ORAL_TABLET | ORAL | Status: AC | PRN
  Administered 2014-09-23: 1 via ORAL
  Filled 2014-09-23: qty 2
  Filled 2014-09-23: qty 1

## 2014-09-23 MED ORDER — SODIUM CHLORIDE 0.9 % IV SOLN: INTRAVENOUS | Status: DC

## 2014-09-23 MED ORDER — ACETAMINOPHEN 650 MG RE SUPP: 650.0000 mg | Freq: Four times a day (QID) | RECTAL | Status: DC | PRN

## 2014-09-23 MED ORDER — MORPHINE SULFATE 2 MG/ML IJ SOLN
2.0000 mg | Freq: Once | INTRAMUSCULAR | Status: AC
  Administered 2014-09-23: 2 mg via INTRAVENOUS
  Filled 2014-09-23: qty 1

## 2014-09-23 MED ORDER — ENOXAPARIN SODIUM 40 MG/0.4ML ~~LOC~~ SOLN
40.0000 mg | SUBCUTANEOUS | Status: DC
  Administered 2014-09-23: 40 mg via SUBCUTANEOUS
  Filled 2014-09-23 (×2): qty 0.4

## 2014-09-23 NOTE — ED Notes (Signed)
Bed: WA03 Expected date:  Expected time:  Means of arrival:  Comments: EMS, fall

## 2014-09-23 NOTE — ED Notes (Signed)
Per EMS pt from independent living and had a witnessed fall in her living room. Pt uses a walker at home. Hit left side. Did not hit head or have any LOC. No neck or back pain. C/o only of left hip pain. No shortening or rotation noted.

## 2014-09-23 NOTE — Consult Note (Signed)
Reason for Consult:  Left hip fracture (displaced femoral neck fracture) Referring Physician:   EDP  [name] is an 80 y.o. female.  HPI:   80 yo female with a past stroke history who sustained a mechanical fall today.  Was brought to the [facility] ED and found to have a left hip fracture.  Both Orthopedic Surgery and [facility] are consulted.  She does report left hip pain.  He family is at the bedside.  Past Medical History  Diagnosis Date  . Alcohol abuse   . Arthritis   . Depression   . Stroke   . Thyroid disease     hypothyroid    Past Surgical History  Procedure Laterality Date  . Cholecystectomy  2008  . Tonsillectomy and adenoidectomy    . Abdominal hysterectomy      partial  . Thyroid surgery      Family History  Problem Relation Age of Onset  . Arthritis Mother   . Hyperlipidemia Mother   . Stroke Mother   . Hypertension Mother   . Mental illness Mother   . Cancer Father     lung  . Cancer Sister     ovarian/uterine  . Stroke Sister   . Hypertension Sister   . Hypertension Brother   . Diabetes Brother   . Cancer Daughter     breast    Social History:  reports that she has never smoked. She has never used smokeless tobacco. She reports that she does not drink alcohol or use illicit drugs.  Allergies:  Allergies  Allergen Reactions  . Citalopram Hydrobromide     Shaky inside  . Sulfa Antibiotics     rash  . Ciprofloxacin Rash    Medications: I have reviewed the patient's current medications.  Results for orders placed or performed during the hospital encounter of 09/23/14 (from the past 48 hour(s))  Type and screen     Status: None   Collection Time: 09/23/14  1:53 PM  Result Value Ref Range   ABO/RH(D) O NEG    Antibody Screen NEG    Sample Expiration 09/26/2014   CBC     Status: Abnormal   Collection Time: 09/23/14  1:55 PM  Result Value Ref Range   WBC 7.1 4.0 - 10.5 K/uL   RBC 4.00 3.87 - 5.11 MIL/uL   Hemoglobin 12.1  12.0 - 15.0 g/dL   HCT 35.9 (L) 36.0 - 46.0 %   MCV 89.8 78.0 - 100.0 fL   MCH 30.3 26.0 - 34.0 pg   MCHC 33.7 30.0 - 36.0 g/dL   RDW 13.1 11.5 - 15.5 %   Platelets 298 150 - 400 K/uL  Comprehensive metabolic panel     Status: Abnormal   Collection Time: 09/23/14  1:55 PM  Result Value Ref Range   Sodium 132 (L) 137 - 147 mEq/L   Potassium 4.2 3.7 - 5.3 mEq/L   Chloride 95 (L) 96 - 112 mEq/L   CO2 25 19 - 32 mEq/L   Glucose, Bld 100 (H) 70 - 99 mg/dL   BUN 12 6 - 23 mg/dL   Creatinine, Ser 0.54 0.50 - 1.10 mg/dL   Calcium 9.1 8.4 - 10.5 mg/dL   Total Protein 7.2 6.0 - 8.3 g/dL   Albumin 3.6 3.5 - 5.2 g/dL   AST 22 0 - 37 U/L   ALT 14 0 - 35 U/L   Alkaline Phosphatase 88 39 - 117 U/L   Total Bilirubin 0.3  0.3 - 1.2 mg/dL   GFR calc non Af Amer 86 (L) >90 mL/min   GFR calc Af Amer >90 >90 mL/min    Comment: (NOTE) The eGFR has been calculated using the CKD EPI equation. This calculation has not been validated in all clinical situations. eGFR's persistently <90 mL/min signify possible Chronic Kidney Disease.    Anion gap 12 5 - 15  Protime-INR     Status: None   Collection Time: 09/23/14  1:55 PM  Result Value Ref Range   Prothrombin Time 13.8 11.6 - 15.2 seconds   INR 1.05 0.00 - 1.49  ABO/Rh     Status: None   Collection Time: 09/23/14  1:55 PM  Result Value Ref Range   ABO/RH(D) O NEG     Dg Chest 1 View  09/23/2014   CLINICAL DATA:  Pre operative respiratory exam. Acute left hip fracture.  EXAM: CHEST - 1 VIEW  COMPARISON:  02/14/2011  FINDINGS: Heart size and pulmonary vascularity are normal and the lungs are clear. No acute osseous abnormality. Multiple old healed right rib fractures. Fairly severe chronic rotator cuff disease bilaterally.  IMPRESSION: No acute abnormalities.   Electronically Signed   By: Rozetta Nunnery M.D.   On: 09/23/2014 14:36   Dg Hip Complete Left  09/23/2014   CLINICAL DATA:  Left hip pain secondary to a fall in her living room.  EXAM: LEFT HIP  - COMPLETE 2+ VIEW  COMPARISON:  None.  FINDINGS: There is an overriding slightly angulated subcapital fracture of the left femoral neck.  No other acute abnormality. Moderately severe arthritis of the right hip with evidence of old right ischial fractures.  IMPRESSION: Fracture of the left femoral neck.   Electronically Signed   By: Rozetta Nunnery M.D.   On: 09/23/2014 14:34    ROS Blood pressure 167/63, pulse 86, temperature 98.3 F (36.8 C), temperature source Oral, resp. rate 23, SpO2 95 %. Physical Exam  Musculoskeletal:       Left hip: She exhibits decreased range of motion, bony tenderness and deformity.  Her left leg is shortened and externally rotated.  She is able to move her left foot/toes. She has a great pulse in her left foot  Assessment/Plan: Displaced left hip femoral neck fracture with some OA of the acetabulum 1)  I have spoken to her and her daughters in length.  It is recommended that she have this addressed surgically due to her pain, the displaced nature of her fracture, and the impact it will have on her quality of life.  They understand fully the risks of acute blood loss anemia, infection, further fracture, DVT, dislocation, and even death given her co-morbidities.  I feel that she would best benefit from a direct anterior total hip replacement.  Will proceed with surgery late tomorrow.  Mcarthur Rossetti 09/23/2014, 6:20 PM

## 2014-09-23 NOTE — H&P (Signed)
[facility] History and Physical  [name] KGU:[id number] DOB: 1931/07/24 DOA: 09/23/2014  Referring physician: Dr. Ashok Cordia PCP: [name], MD   Chief Complaint: fall at home   HPI:  80 year old female with recently diagnosed parkinsonism and underlying vascular dementia, history of TIA / ? Stroke on Plavix, hypothyroidism who presented with a mechanical fall at home. Patient has 24-hour caregiver and as per patient's family and caregiver at bedside patient was trying to ambulate with her walker and possibly tripped and landed on her left hip. The caregiver did not witness any other injuries sustained including head injury or loss of consciousness. Patient has severe dementia and is unable to provide much history. In the ED x-ray of the left hip showed left femoral neck fracture. Orthopedic surgeon Dr. Ninfa Linden was consulted by ED physician were recommended admission to hospitalist service and he would consult. Patient denies headache, dizziness, fever, chills, nausea , vomiting, chest pain, palpitations, SOB, abdominal pain, bowel or urinary symptoms. Patient given 2 mg IV morphine in the ED after which her symptoms improved.  Review of Systems: (limited due to underlying dementia) Constitutional: Denies fever, chills, diaphoresis, appetite change and fatigue.  HEENT: Denies visual or hearing symptoms, denies difficulty swallowing or sore throat. Denies neck pain  Respiratory: Denies SOB, , cough, chest discomfort Cardiovascular: Denies chest pain, palpitations and leg swelling.  Gastrointestinal: Denies nausea, vomiting, abdominal pain, diarrhea,  blood in stool  Genitourinary: Denies dysuria, hematuria, flank pain and difficulty urinating.  Endocrine: Denies polyuria, polydipsia. Musculoskeletal: Denies myalgias, back pain,joint pain,  shuffling gait+ Neurological: Denies dizziness, headache, weakness, syncope Psychiatric/Behavioral: negative for worsened  confusion  Past Medical History  Diagnosis Date  . Alcohol abuse   . Arthritis   . Depression   . Stroke   . Thyroid disease     hypothyroid   Past Surgical History  Procedure Laterality Date  . Cholecystectomy  2008  . Tonsillectomy and adenoidectomy    . Abdominal hysterectomy      partial  . Thyroid surgery     Social History:  reports that she has never smoked. She has never used smokeless tobacco. She reports that she does not drink alcohol or use illicit drugs.  Allergies  Allergen Reactions  . Citalopram Hydrobromide     Shaky inside  . Sulfa Antibiotics     rash  . Ciprofloxacin Rash    Family History  Problem Relation Age of Onset  . Arthritis Mother   . Hyperlipidemia Mother   . Stroke Mother   . Hypertension Mother   . Mental illness Mother   . Cancer Father     lung  . Cancer Sister     ovarian/uterine  . Stroke Sister   . Hypertension Sister   . Hypertension Brother   . Diabetes Brother   . Cancer Daughter     breast    Prior to Admission medications   Medication Sig Start Date End Date Taking? Authorizing Provider  acetaminophen (TYLENOL) 325 MG tablet Take 325 mg by mouth every 6 (six) hours as needed for mild pain or headache.   Yes Historical Provider, MD  clopidogrel (PLAVIX) 75 MG tablet Take 75 mg by mouth daily.  04/07/14 04/07/15 Yes Historical Provider, MD  levothyroxine (SYNTHROID, LEVOTHROID) 112 MCG tablet Take 112 mcg by mouth daily before breakfast.   Yes Historical Provider, MD  Multiple Vitamin (MULTIVITAMIN WITH MINERALS) TABS Take 1 tablet by mouth daily.   Yes Historical Provider, MD  OVER THE COUNTER  MEDICATION Take 0.5 tablets by mouth as needed (arthritis pain). OTC arthritis medicine from [facility]   Yes Historical Provider, MD  Probiotic Product (ACIDOPHILUS/GOAT MILK) CAPS Take by mouth.   Yes Historical Provider, MD  acetaminophen (TYLENOL) 650 MG CR tablet Take 325 mg by mouth 2 (two) times daily.     Historical Provider,  MD  levothyroxine (SYNTHROID, LEVOTHROID) 112 MCG tablet TAKE 1 TABLET (112 MCG TOTAL) BY MOUTH DAILY.    [name], MD     Physical Exam:  Filed Vitals:   09/23/14 1303 09/23/14 1536 09/23/14 1601  BP: 153/53 161/76 167/63  Pulse: 80  86  Temp: 98 F (36.7 C) 98.3 F (36.8 C)   TempSrc: Oral Oral   Resp: 17 23   SpO2: 98%  95%    Constitutional: Vital signs reviewed. Elderly female in no acute distress. Has flat affect HEENT: no scalp laceration or bruise,no pallor, no icterus, moist oral mucosa,  Cardiovascular: RRR, S1 and S2 normal, systolic murmur 3/6,  Chest: CTAB, no wheezes, rales, or rhonchi Abdominal: Soft. Non-tender, non-distended, bowel sounds are normal,  Ext: warm, no edema, shortened left leg with limited ROM due to pain Neurological: alert and awake, oriented to person and occasionally to place, (baseline per daughter at bedside)  Labs on Admission:  Basic Metabolic Panel:  Recent Labs Lab 09/23/14 1355  NA 132*  K 4.2  CL 95*  CO2 25  GLUCOSE 100*  BUN 12  CREATININE 0.54  CALCIUM 9.1   Liver Function Tests:  Recent Labs Lab 09/23/14 1355  AST 22  ALT 14  ALKPHOS 88  BILITOT 0.3  PROT 7.2  ALBUMIN 3.6   No results for input(s): LIPASE, AMYLASE in the last 168 hours. No results for input(s): AMMONIA in the last 168 hours. CBC:  Recent Labs Lab 09/23/14 1355  WBC 7.1  HGB 12.1  HCT 35.9*  MCV 89.8  PLT 298   Cardiac Enzymes: No results for input(s): CKTOTAL, CKMB, CKMBINDEX, TROPONINI in the last 168 hours. BNP: Invalid input(s): POCBNP CBG: No results for input(s): GLUCAP in the last 168 hours.  Radiological Exams on Admission: Dg Chest 1 View  09/23/2014   CLINICAL DATA:  Pre operative respiratory exam. Acute left hip fracture.  EXAM: CHEST - 1 VIEW  COMPARISON:  02/14/2011  FINDINGS: Heart size and pulmonary vascularity are normal and the lungs are clear. No acute osseous abnormality. Multiple old healed right rib  fractures. Fairly severe chronic rotator cuff disease bilaterally.  IMPRESSION: No acute abnormalities.   Electronically Signed   By: Rozetta Nunnery M.D.   On: 09/23/2014 14:36   Dg Hip Complete Left  09/23/2014   CLINICAL DATA:  Left hip pain secondary to a fall in her living room.  EXAM: LEFT HIP - COMPLETE 2+ VIEW  COMPARISON:  None.  FINDINGS: There is an overriding slightly angulated subcapital fracture of the left femoral neck.  No other acute abnormality. Moderately severe arthritis of the right hip with evidence of old right ischial fractures.  IMPRESSION: Fracture of the left femoral neck.   Electronically Signed   By: Rozetta Nunnery M.D.   On: 09/23/2014 14:34    EKG: normal sinus rhythm, no ST-T changes  Assessment/Plan  Principal Problem:   Left femoral neck fracture -secondary to mechanical fall Admit to MedSurg. In control with Vicodin when necessary every 4 hours and IV morphine every 3 hours as needed for severe pain. Added Senokot. -Gentle IV hydration. Spoke with  Dr. Ninfa Linden who plans on surgery tomorrow after seeing the patient. We'll keep her nothing by mouth after midnight.  -discontinue Plavix  Active Problems:   Vascular / Parkinson's dementia Patient seen by neurology recently for shuffling gait and considered this likely to be parkinsonism with severe vascular dementia.Patient also has long history of alcohol abuse. She has 24-hour home care. Currently not on any medications. -Follow-up with neurology as outpatient   hypothyroidism Continue Synthroid Recent TSH normal  History of TIA On Plavix. Check lipid panel and if abnormal she will benefit from being on statin which will help with her vascular dementia as well     Essential hypertension, benign Stable. Not on any medications     Diet:cardiac, NPO after midnight  DVT prophylaxis: sq lovenox   Code Status: full code Family Communication: discussed with daughter at bedside Disposition Plan: admit to  med surg  Louellen Molder [facility] Pager [phone]  Total time spent on admission :60 minutes  If 7PM-7AM, please contact night-coverage [email or web address] Password [facility] 09/23/2014, 4:23 PM

## 2014-09-23 NOTE — ED Provider Notes (Addendum)
CSN: [id number]     Arrival date & time 09/23/14  1300 History   First MD Initiated Contact with Patient 09/23/14 1306     Chief Complaint  Patient presents with  . Fall  . Hip Pain     (Consider location/radiation/quality/duration/timing/severity/associated sxs/prior Treatment) Patient is a 80 y.o. female presenting with fall and hip pain. The history is provided by the patient and a caregiver.  Fall Pertinent negatives include no chest pain, no abdominal pain, no headaches and no shortness of breath.  Hip Pain Pertinent negatives include no chest pain, no abdominal pain, no headaches and no shortness of breath.  pt s/p fall at home today. Uses walker, had let go of walker, and lost balance. Fell onto left hip. C/o left hip pain, constant, dull, mod-severe. Worse w movement. Denies faintness or dizziness prior to fall. No loc w fall. No head injury or headache. No neck or back pain. Pt denies any other pain or injury. No numbness/weakness.  States recent health at baseline, was asymptomatic today. No anticoag use, other than taking plavix.       Past Medical History  Diagnosis Date  . Alcohol abuse   . Arthritis   . Depression   . Stroke   . Thyroid disease     hypothyroid   Past Surgical History  Procedure Laterality Date  . Cholecystectomy  2008  . Tonsillectomy and adenoidectomy    . Abdominal hysterectomy      partial  . Thyroid surgery     Family History  Problem Relation Age of Onset  . Arthritis Mother   . Hyperlipidemia Mother   . Stroke Mother   . Hypertension Mother   . Mental illness Mother   . Cancer Father     lung  . Cancer Sister     ovarian/uterine  . Stroke Sister   . Hypertension Sister   . Hypertension Brother   . Diabetes Brother   . Cancer Daughter     breast   History  Substance Use Topics  . Smoking status: Never Smoker   . Smokeless tobacco: Never Used  . Alcohol Use: No     Comment: used to drink    OB History    No data  available     Review of Systems  Constitutional: Negative for fever and chills.  HENT: Negative for sore throat.   Eyes: Negative for pain and visual disturbance.  Respiratory: Negative for shortness of breath.   Cardiovascular: Negative for chest pain.  Gastrointestinal: Negative for vomiting and abdominal pain.  Genitourinary: Negative for flank pain.  Musculoskeletal: Negative for back pain and neck pain.  Skin: Negative for wound.  Neurological: Negative for weakness, numbness and headaches.  Hematological: Does not bruise/bleed easily.  Psychiatric/Behavioral: Negative for confusion.      Allergies  Citalopram hydrobromide; Sulfa antibiotics; and Ciprofloxacin  Home Medications   Prior to Admission medications   Medication Sig Start Date End Date Taking? Authorizing Provider  acetaminophen (TYLENOL) 650 MG CR tablet Take 325 mg by mouth 2 (two) times daily.     Historical Provider, MD  clopidogrel (PLAVIX) 75 MG tablet Take 75 mg by mouth daily.  04/07/14 04/07/15  Historical Provider, MD  levothyroxine (SYNTHROID, LEVOTHROID) 112 MCG tablet TAKE 1 TABLET (112 MCG TOTAL) BY MOUTH DAILY.    [name], MD  Multiple Vitamin (MULTIVITAMIN WITH MINERALS) TABS Take 1 tablet by mouth daily.    Historical Provider, MD  Probiotic Product (ACIDOPHILUS/GOAT MILK)  CAPS Take by mouth.    Historical Provider, MD   BP 153/53 mmHg  Pulse 80  Temp(Src) 98 F (36.7 C) (Oral)  Resp 17  SpO2 98% Physical Exam  Constitutional: She is oriented to person, place, and time. She appears well-developed and well-nourished. No distress.  HENT:  Head: Atraumatic.  Eyes: Conjunctivae are normal. Pupils are equal, round, and reactive to light. No scleral icterus.  Neck: Normal range of motion. Neck supple. No tracheal deviation present.  Cardiovascular: Normal rate, regular rhythm, normal heart sounds and intact distal pulses.   Pulmonary/Chest: Effort normal and breath sounds normal. No  respiratory distress. She exhibits no tenderness.  Abdominal: Soft. Normal appearance and bowel sounds are normal. She exhibits no distension. There is no tenderness.  Musculoskeletal: She exhibits no edema.  +tenderness and pain w rom left hip. Mild shortening. Distal pulses palp.  Good rom bil ext without any other focal pain or bony tenderness. CTLS spine, non tender, aligned, no step off.   Neurological: She is alert and oriented to person, place, and time.  Motor/sens intact bil ext.   Skin: Skin is warm and dry. No rash noted. She is not diaphoretic.  Psychiatric: She has a normal mood and affect.  Nursing note and vitals reviewed.   ED Course  Procedures (including critical care time) Labs Review   Results for orders placed or performed during the hospital encounter of 09/23/14  CBC  Result Value Ref Range   WBC 7.1 4.0 - 10.5 K/uL   RBC 4.00 3.87 - 5.11 MIL/uL   Hemoglobin 12.1 12.0 - 15.0 g/dL   HCT 35.9 (L) 36.0 - 46.0 %   MCV 89.8 78.0 - 100.0 fL   MCH 30.3 26.0 - 34.0 pg   MCHC 33.7 30.0 - 36.0 g/dL   RDW 13.1 11.5 - 15.5 %   Platelets 298 150 - 400 K/uL  Comprehensive metabolic panel  Result Value Ref Range   Sodium 132 (L) 137 - 147 mEq/L   Potassium 4.2 3.7 - 5.3 mEq/L   Chloride 95 (L) 96 - 112 mEq/L   CO2 25 19 - 32 mEq/L   Glucose, Bld 100 (H) 70 - 99 mg/dL   BUN 12 6 - 23 mg/dL   Creatinine, Ser 0.54 0.50 - 1.10 mg/dL   Calcium 9.1 8.4 - 10.5 mg/dL   Total Protein 7.2 6.0 - 8.3 g/dL   Albumin 3.6 3.5 - 5.2 g/dL   AST 22 0 - 37 U/L   ALT 14 0 - 35 U/L   Alkaline Phosphatase 88 39 - 117 U/L   Total Bilirubin 0.3 0.3 - 1.2 mg/dL   GFR calc non Af Amer 86 (L) >90 mL/min   GFR calc Af Amer >90 >90 mL/min   Anion gap 12 5 - 15  Protime-INR  Result Value Ref Range   Prothrombin Time 13.8 11.6 - 15.2 seconds   INR 1.05 0.00 - 1.49  Type and screen  Result Value Ref Range   ABO/RH(D) O NEG    Antibody Screen NEG    Sample Expiration 09/26/2014    ABO/Rh  Result Value Ref Range   ABO/RH(D) O NEG    Dg Chest 1 View  09/23/2014   CLINICAL DATA:  Pre operative respiratory exam. Acute left hip fracture.  EXAM: CHEST - 1 VIEW  COMPARISON:  02/14/2011  FINDINGS: Heart size and pulmonary vascularity are normal and the lungs are clear. No acute osseous abnormality. Multiple old healed right rib  fractures. Fairly severe chronic rotator cuff disease bilaterally.  IMPRESSION: No acute abnormalities.   Electronically Signed   By: Rozetta Nunnery M.D.   On: 09/23/2014 14:36   Dg Hip Complete Left  09/23/2014   CLINICAL DATA:  Left hip pain secondary to a fall in her living room.  EXAM: LEFT HIP - COMPLETE 2+ VIEW  COMPARISON:  None.  FINDINGS: There is an overriding slightly angulated subcapital fracture of the left femoral neck.  No other acute abnormality. Moderately severe arthritis of the right hip with evidence of old right ischial fractures.  IMPRESSION: Fracture of the left femoral neck.   Electronically Signed   By: Rozetta Nunnery M.D.   On: 09/23/2014 14:34      EKG Interpretation   Date/Time:  Thursday September 23 2014 15:20:56 EST Ventricular Rate:  82 PR Interval:  163 QRS Duration: 149 QT Interval:  426 QTC Calculation: 498 R Axis:   84 Text Interpretation:  Sinus rhythm Right bundle branch block No  significant change since last tracing Confirmed by Ellasyn Swilling  MD, Lennette Bihari  ([id number]) on 09/23/2014 3:27:09 PM      MDM  Iv ns. Morphine iv.   Labs. Imaging.  Reviewed nursing notes and prior charts for additional history.   Recheck pt comfortable.  Discussed xr results.  Orthopedist consulted - discussed w Dr Ninfa Linden - he will see, states tentatively will plan hip surgery for tomorrow.  hospitalist consulted for admission.       Mirna Mires, MD 09/23/14 [phone]

## 2014-09-23 NOTE — ED Notes (Signed)
Pt and family refused morphine and unable to give tramadol at this time.

## 2014-09-24 ENCOUNTER — Inpatient Hospital Stay (HOSPITAL_COMMUNITY): Payer: Medicare HMO | Admitting: Anesthesiology

## 2014-09-24 ENCOUNTER — Inpatient Hospital Stay (HOSPITAL_COMMUNITY): Payer: Medicare HMO

## 2014-09-24 ENCOUNTER — Encounter (HOSPITAL_COMMUNITY)
Admission: EM | Disposition: A | Discharge status: Home Health | Payer: Self-pay | Source: Home / Self Care | Attending: Internal Medicine

## 2014-09-24 ENCOUNTER — Ambulatory Visit: Payer: Commercial Managed Care - HMO | Admitting: Family Medicine

## 2014-09-24 ENCOUNTER — Encounter (HOSPITAL_COMMUNITY): Payer: Self-pay | Admitting: Certified Registered Nurse Anesthetist

## 2014-09-24 DIAGNOSIS — S72002S Fracture of unspecified part of neck of left femur, sequela: Secondary | ICD-10-CM

## 2014-09-24 HISTORY — PX: TOTAL HIP ARTHROPLASTY: SHX124

## 2014-09-24 LAB — SURGICAL PCR SCREEN
MRSA, PCR: NEGATIVE
STAPHYLOCOCCUS AUREUS: NEGATIVE

## 2014-09-24 LAB — LIPID PANEL
Cholesterol: 214 mg/dL — ABNORMAL HIGH (ref 0–200)
HDL: 71 mg/dL (ref >39)
LDL CALC: 124 mg/dL — AB (ref 0–99)
Total CHOL/HDL Ratio: 3 RATIO
Triglycerides: 95 mg/dL (ref <150)
VLDL: 19 mg/dL (ref 0–40)

## 2014-09-24 LAB — PROTIME-INR
INR: 1.14 (ref 0.00–1.49)
Prothrombin Time: 14.8 seconds (ref 11.6–15.2)

## 2014-09-24 SURGERY — ARTHROPLASTY, HIP, TOTAL, ANTERIOR APPROACH
Anesthesia: General | Laterality: Left

## 2014-09-24 MED ORDER — DEXAMETHASONE SODIUM PHOSPHATE 10 MG/ML IJ SOLN
INTRAMUSCULAR | Status: AC
  Filled 2014-09-24: qty 1

## 2014-09-24 MED ORDER — ONDANSETRON HCL 4 MG/2ML IJ SOLN
4.0000 mg | Freq: Four times a day (QID) | INTRAMUSCULAR | Status: DC | PRN
  Administered 2014-09-25: 4 mg via INTRAVENOUS

## 2014-09-24 MED ORDER — CEFAZOLIN SODIUM-DEXTROSE 2-3 GM-% IV SOLR
2.0000 g | Freq: Four times a day (QID) | INTRAVENOUS | Status: AC
  Administered 2014-09-24 – 2014-09-25 (×2): 2 g via INTRAVENOUS
  Filled 2014-09-24 (×2): qty 50

## 2014-09-24 MED ORDER — PROPOFOL 10 MG/ML IV BOLUS
INTRAVENOUS | Status: DC | PRN
  Administered 2014-09-24: 100 mg via INTRAVENOUS

## 2014-09-24 MED ORDER — NEOSTIGMINE METHYLSULFATE 10 MG/10ML IV SOLN
INTRAVENOUS | Status: AC
  Filled 2014-09-24: qty 1

## 2014-09-24 MED ORDER — SODIUM CHLORIDE 0.9 % IR SOLN
Status: DC | PRN
  Administered 2014-09-24: 1000 mL

## 2014-09-24 MED ORDER — PHENYLEPHRINE 40 MCG/ML (10ML) SYRINGE FOR IV PUSH (FOR BLOOD PRESSURE SUPPORT)
PREFILLED_SYRINGE | INTRAVENOUS | Status: AC
  Filled 2014-09-24: qty 10

## 2014-09-24 MED ORDER — METHOCARBAMOL 500 MG PO TABS: 500.0000 mg | ORAL_TABLET | Freq: Four times a day (QID) | ORAL | Status: DC | PRN

## 2014-09-24 MED ORDER — GLYCOPYRROLATE 0.2 MG/ML IJ SOLN
INTRAMUSCULAR | Status: DC | PRN
  Administered 2014-09-24: 0.6 mg via INTRAVENOUS

## 2014-09-24 MED ORDER — PHENYLEPHRINE HCL 10 MG/ML IJ SOLN
INTRAMUSCULAR | Status: DC | PRN
  Administered 2014-09-24 (×7): 80 ug via INTRAVENOUS

## 2014-09-24 MED ORDER — ONDANSETRON HCL 4 MG/2ML IJ SOLN
INTRAMUSCULAR | Status: AC
  Filled 2014-09-24: qty 2

## 2014-09-24 MED ORDER — ONDANSETRON HCL 4 MG/2ML IJ SOLN
INTRAMUSCULAR | Status: DC | PRN
  Administered 2014-09-24: 4 mg via INTRAVENOUS

## 2014-09-24 MED ORDER — LACTATED RINGERS IV SOLN
INTRAVENOUS | Status: DC | PRN
  Administered 2014-09-24: 15:00:00 via INTRAVENOUS

## 2014-09-24 MED ORDER — METHOCARBAMOL 1000 MG/10ML IJ SOLN
500.0000 mg | Freq: Four times a day (QID) | INTRAVENOUS | Status: DC | PRN
  Administered 2014-09-24: 500 mg via INTRAVENOUS
  Filled 2014-09-24 (×2): qty 5

## 2014-09-24 MED ORDER — FENTANYL CITRATE 0.05 MG/ML IJ SOLN
INTRAMUSCULAR | Status: AC
  Filled 2014-09-24: qty 5

## 2014-09-24 MED ORDER — HYDROMORPHONE HCL 1 MG/ML IJ SOLN: 0.2500 mg | INTRAMUSCULAR | Status: DC | PRN

## 2014-09-24 MED ORDER — FENTANYL CITRATE 0.05 MG/ML IJ SOLN
INTRAMUSCULAR | Status: AC
  Filled 2014-09-24: qty 2

## 2014-09-24 MED ORDER — SODIUM CHLORIDE 0.9 % IV SOLN: INTRAVENOUS | Status: DC

## 2014-09-24 MED ORDER — LIDOCAINE HCL (CARDIAC) 20 MG/ML IV SOLN
INTRAVENOUS | Status: AC
  Filled 2014-09-24: qty 5

## 2014-09-24 MED ORDER — 0.9 % SODIUM CHLORIDE (POUR BTL) OPTIME
TOPICAL | Status: DC | PRN
  Administered 2014-09-24: 1000 mL

## 2014-09-24 MED ORDER — NEOSTIGMINE METHYLSULFATE 10 MG/10ML IV SOLN
INTRAVENOUS | Status: DC | PRN
  Administered 2014-09-24: 5 mg via INTRAVENOUS

## 2014-09-24 MED ORDER — ROCURONIUM BROMIDE 100 MG/10ML IV SOLN
INTRAVENOUS | Status: AC
  Filled 2014-09-24: qty 1

## 2014-09-24 MED ORDER — MORPHINE SULFATE 2 MG/ML IJ SOLN: 0.5000 mg | INTRAMUSCULAR | Status: DC | PRN

## 2014-09-24 MED ORDER — DEXAMETHASONE SODIUM PHOSPHATE 10 MG/ML IJ SOLN
INTRAMUSCULAR | Status: DC | PRN
  Administered 2014-09-24: 10 mg via INTRAVENOUS

## 2014-09-24 MED ORDER — METOCLOPRAMIDE HCL 10 MG PO TABS: 5.0000 mg | ORAL_TABLET | Freq: Three times a day (TID) | ORAL | Status: DC | PRN

## 2014-09-24 MED ORDER — METOCLOPRAMIDE HCL 5 MG/ML IJ SOLN: 5.0000 mg | Freq: Three times a day (TID) | INTRAMUSCULAR | Status: DC | PRN

## 2014-09-24 MED ORDER — LACTATED RINGERS IV SOLN
INTRAVENOUS | Status: DC | PRN
  Administered 2014-09-24: 16:00:00 via INTRAVENOUS

## 2014-09-24 MED ORDER — LACTATED RINGERS IV SOLN: INTRAVENOUS | Status: DC

## 2014-09-24 MED ORDER — ATORVASTATIN CALCIUM 20 MG PO TABS
20.0000 mg | ORAL_TABLET | Freq: Every day | ORAL | Status: DC
  Administered 2014-09-25 – 2014-09-28 (×3): 20 mg via ORAL
  Filled 2014-09-24 (×5): qty 1

## 2014-09-24 MED ORDER — CLOPIDOGREL BISULFATE 75 MG PO TABS
75.0000 mg | ORAL_TABLET | Freq: Every day | ORAL | Status: DC
  Administered 2014-09-25 – 2014-09-28 (×4): 75 mg via ORAL
  Filled 2014-09-24 (×4): qty 1

## 2014-09-24 MED ORDER — PHENOL 1.4 % MT LIQD: 1.0000 | OROMUCOSAL | Status: DC | PRN

## 2014-09-24 MED ORDER — FENTANYL CITRATE 0.05 MG/ML IJ SOLN
INTRAMUSCULAR | Status: DC | PRN
  Administered 2014-09-24: 25 ug via INTRAVENOUS
  Administered 2014-09-24: 50 ug via INTRAVENOUS
  Administered 2014-09-24 (×2): 25 ug via INTRAVENOUS

## 2014-09-24 MED ORDER — LORAZEPAM 2 MG/ML IJ SOLN
0.5000 mg | Freq: Once | INTRAMUSCULAR | Status: AC
  Administered 2014-09-24: 0.5 mg via INTRAVENOUS
  Filled 2014-09-24: qty 1

## 2014-09-24 MED ORDER — LIDOCAINE HCL (CARDIAC) 20 MG/ML IV SOLN
INTRAVENOUS | Status: DC | PRN
  Administered 2014-09-24: 100 mg via INTRAVENOUS

## 2014-09-24 MED ORDER — SUCCINYLCHOLINE CHLORIDE 20 MG/ML IJ SOLN
INTRAMUSCULAR | Status: DC | PRN
  Administered 2014-09-24: 100 mg via INTRAVENOUS

## 2014-09-24 MED ORDER — ZOLPIDEM TARTRATE 5 MG PO TABS: 5.0000 mg | ORAL_TABLET | Freq: Every evening | ORAL | Status: DC | PRN

## 2014-09-24 MED ORDER — ACETAMINOPHEN 325 MG PO TABS
650.0000 mg | ORAL_TABLET | Freq: Four times a day (QID) | ORAL | Status: DC | PRN
  Administered 2014-09-27 – 2014-09-28 (×5): 650 mg via ORAL
  Filled 2014-09-24 (×6): qty 2

## 2014-09-24 MED ORDER — ONDANSETRON HCL 4 MG PO TABS: 4.0000 mg | ORAL_TABLET | Freq: Four times a day (QID) | ORAL | Status: DC | PRN

## 2014-09-24 MED ORDER — FENTANYL CITRATE 0.05 MG/ML IJ SOLN
25.0000 ug | INTRAMUSCULAR | Status: DC | PRN
  Administered 2014-09-24 (×4): 25 ug via INTRAVENOUS

## 2014-09-24 MED ORDER — EPHEDRINE SULFATE 50 MG/ML IJ SOLN
INTRAMUSCULAR | Status: DC | PRN
  Administered 2014-09-24: 5 mg via INTRAVENOUS
  Administered 2014-09-24: 10 mg via INTRAVENOUS
  Administered 2014-09-24 (×2): 5 mg via INTRAVENOUS

## 2014-09-24 MED ORDER — CEFAZOLIN SODIUM-DEXTROSE 2-3 GM-% IV SOLR
INTRAVENOUS | Status: AC
  Filled 2014-09-24: qty 50

## 2014-09-24 MED ORDER — PROPOFOL 10 MG/ML IV BOLUS
INTRAVENOUS | Status: AC
  Filled 2014-09-24: qty 20

## 2014-09-24 MED ORDER — SENNA 8.6 MG PO TABS
1.0000 | ORAL_TABLET | Freq: Two times a day (BID) | ORAL | Status: DC
  Administered 2014-09-25 – 2014-09-28 (×5): 8.6 mg via ORAL
  Filled 2014-09-24 (×3): qty 1

## 2014-09-24 MED ORDER — ACETAMINOPHEN 650 MG RE SUPP: 650.0000 mg | Freq: Four times a day (QID) | RECTAL | Status: DC | PRN

## 2014-09-24 MED ORDER — CEFAZOLIN SODIUM-DEXTROSE 2-3 GM-% IV SOLR
INTRAVENOUS | Status: DC | PRN
  Administered 2014-09-24: 2 g via INTRAVENOUS

## 2014-09-24 MED ORDER — GLYCOPYRROLATE 0.2 MG/ML IJ SOLN
INTRAMUSCULAR | Status: AC
  Filled 2014-09-24: qty 3

## 2014-09-24 MED ORDER — MENTHOL 3 MG MT LOZG
1.0000 | LOZENGE | OROMUCOSAL | Status: DC | PRN
  Filled 2014-09-24: qty 9

## 2014-09-24 MED ORDER — HYDROCODONE-ACETAMINOPHEN 5-325 MG PO TABS
1.0000 | ORAL_TABLET | Freq: Four times a day (QID) | ORAL | Status: DC | PRN
  Administered 2014-09-25 – 2014-09-26 (×2): 1 via ORAL
  Administered 2014-09-26: 0.5 via ORAL
  Administered 2014-09-27: 1 via ORAL
  Filled 2014-09-24 (×5): qty 1

## 2014-09-24 MED ORDER — ROCURONIUM BROMIDE 100 MG/10ML IV SOLN
INTRAVENOUS | Status: DC | PRN
  Administered 2014-09-24: 20 mg via INTRAVENOUS

## 2014-09-24 SURGICAL SUPPLY — 41 items
BAG ZIPLOCK 12X15 (MISCELLANEOUS) IMPLANT
BENZOIN TINCTURE PRP APPL 2/3 (GAUZE/BANDAGES/DRESSINGS) ×3 IMPLANT
BLADE SAW SGTL 18X1.27X75 (BLADE) ×2 IMPLANT
BLADE SAW SGTL 18X1.27X75MM (BLADE) ×1
CAPT HIP PF MOP ×3 IMPLANT
CELLS DAT CNTRL 66122 CELL SVR (MISCELLANEOUS) ×1 IMPLANT
CLOSURE WOUND 1/2 X4 (GAUZE/BANDAGES/DRESSINGS) ×1
COVER PERINEAL POST (MISCELLANEOUS) ×3 IMPLANT
DRAPE C-ARM 42X120 X-RAY (DRAPES) ×3 IMPLANT
DRAPE STERI IOBAN 125X83 (DRAPES) ×3 IMPLANT
DRAPE U-SHAPE 47X51 STRL (DRAPES) ×9 IMPLANT
DRSG AQUACEL AG ADV 3.5X10 (GAUZE/BANDAGES/DRESSINGS) ×3 IMPLANT
DRSG AQUACEL AG ADV 3.5X14 (GAUZE/BANDAGES/DRESSINGS) ×3 IMPLANT
DURAPREP 26ML APPLICATOR (WOUND CARE) ×3 IMPLANT
ELECT BLADE TIP CTD 4 INCH (ELECTRODE) ×3 IMPLANT
ELECT REM PT RETURN 9FT ADLT (ELECTROSURGICAL) ×3
ELECTRODE REM PT RTRN 9FT ADLT (ELECTROSURGICAL) ×1 IMPLANT
ELIMINATOR HOLE APEX DEPUY (Hips) IMPLANT
FACESHIELD WRAPAROUND (MASK) ×6 IMPLANT
GAUZE XEROFORM 1X8 LF (GAUZE/BANDAGES/DRESSINGS) IMPLANT
GLOVE BIO SURGEON STRL SZ7.5 (GLOVE) ×3 IMPLANT
GLOVE BIOGEL PI IND STRL 8 (GLOVE) ×2 IMPLANT
GLOVE BIOGEL PI INDICATOR 8 (GLOVE) ×4
GLOVE ECLIPSE 8.0 STRL XLNG CF (GLOVE) ×3 IMPLANT
GOWN STRL REUS W/TWL XL LVL3 (GOWN DISPOSABLE) ×6 IMPLANT
HANDPIECE INTERPULSE COAX TIP (DISPOSABLE) ×2
KIT BASIN OR (CUSTOM PROCEDURE TRAY) ×3 IMPLANT
PACK TOTAL JOINT (CUSTOM PROCEDURE TRAY) ×3 IMPLANT
RTRCTR WOUND ALEXIS 18CM MED (MISCELLANEOUS) ×3
SET HNDPC FAN SPRY TIP SCT (DISPOSABLE) ×1 IMPLANT
STAPLER VISISTAT 35W (STAPLE) IMPLANT
STRIP CLOSURE SKIN 1/2X4 (GAUZE/BANDAGES/DRESSINGS) ×2 IMPLANT
SUT ETHIBOND NAB CT1 #1 30IN (SUTURE) ×3 IMPLANT
SUT MNCRL AB 4-0 PS2 18 (SUTURE) IMPLANT
SUT VIC AB 0 CT1 36 (SUTURE) ×3 IMPLANT
SUT VIC AB 1 CT1 36 (SUTURE) ×3 IMPLANT
SUT VIC AB 2-0 CT1 27 (SUTURE) ×4
SUT VIC AB 2-0 CT1 TAPERPNT 27 (SUTURE) ×2 IMPLANT
TOWEL OR 17X26 10 PK STRL BLUE (TOWEL DISPOSABLE) ×3 IMPLANT
TOWEL OR NON WOVEN STRL DISP B (DISPOSABLE) ×3 IMPLANT
TRAY FOLEY CATH 14FRSI W/METER (CATHETERS) IMPLANT

## 2014-09-24 NOTE — Progress Notes (Signed)
INITIAL NUTRITION ASSESSMENT  DOCUMENTATION CODES Per approved criteria  Unable to assess   INTERVENTION: 1.  General healthful diet; encourage intake of foods and beverages as able post-op once diet progressed.  RD to follow and assess for nutritional adequacy. Consider supplements based on adequacy of intake and nutrition status.   NUTRITION DIAGNOSIS: Inadequate oral intake related to omission of energy dense foods as evidenced by pt currently NPO.   Monitor:  1.  Food/Beverage; diet advancement with pt meeting >/=90% estimated needs with tolerance. 2.  Wt/wt change; monitor trends  Reason for Assessment: MST  80 y.o. female  Admitting Dx: Left displaced femoral neck fracture  ASSESSMENT: Pt admitted s/p mechanical fall with left displaced femoral neck fracture. Family reported weight loss on admission.   Pt has a history of vascular dementia, EtOH use, requiring home assistance.  Her weight history demonstrates a usual weight of 125-130 lbs, now weighing 118 lbs on admission.  RD suspects poor nutrition status, however patient not available for nutrition status at this time- currently in procedure with provider and anticipated surgical repair of hip fracture this afternoon.   RD to follow for diet advancement and would recommend Ensure Complete- or other supplement of patient's choice once diet advanced.   Height: Ht Readings from Last 1 Encounters:  09/17/14 5\' 2"  (1.575 m)    Weight: Wt Readings from Last 1 Encounters:  09/17/14 118 lb (53.524 kg)    Ideal Body Weight: 110 lbs  % Ideal Body Weight: 107%  Wt Readings from Last 10 Encounters:  09/17/14 118 lb (53.524 kg)  08/18/14 118 lb (53.524 kg)  01/08/14 115 lb (52.164 kg)  10/26/13 108 lb (48.988 kg)  10/21/13 108 lb (48.988 kg)  10/09/13 111 lb (50.349 kg)  09/16/13 114 lb (51.71 kg)  08/11/13 114 lb (51.71 kg)  07/03/13 112 lb (50.803 kg)  03/27/13 110 lb (49.896 kg)    Usual Body Weight: 125-135  lbs prior to 2013, recently has fluctuated between 110-115 lbs.   % Usual Body Weight: 94%  BMI:  There is no weight on file to calculate BMI.  Estimated Nutritional Needs: Kcal: 0093-8182 Protein: 70-80g Fluid: >1.5 L/day  Skin: no issues noted  Diet Order: Diet NPO time specified  EDUCATION NEEDS: -Education not appropriate at this time   Intake/Output Summary (Last 24 hours) at 09/24/14 1055 Last data filed at 09/24/14 0600  Gross per 24 hour  Intake 1126.25 ml  Output   2100 ml  Net -973.75 ml    Last BM: PTA  Labs:   Recent Labs Lab 09/23/14 1355  NA 132*  K 4.2  CL 95*  CO2 25  BUN 12  CREATININE 0.54  CALCIUM 9.1  GLUCOSE 100*    CBG (last 3)  No results for input(s): GLUCAP in the last 72 hours.  Scheduled Meds: . enoxaparin (LOVENOX) injection  40 mg Subcutaneous Q24H  . levothyroxine  112 mcg Oral QAC breakfast  . multivitamin with minerals  1 tablet Oral Daily  . ondansetron (ZOFRAN) IV  4 mg Intravenous Once  . senna  1 tablet Oral BID    Continuous Infusions: . sodium chloride    . sodium chloride 75 mL/hr at 09/24/14 0459    Past Medical History  Diagnosis Date  . Alcohol abuse   . Arthritis   . Depression   . Stroke   . Thyroid disease     hypothyroid    Past Surgical History  Procedure Laterality Date  .  Cholecystectomy  2008  . Tonsillectomy and adenoidectomy    . Abdominal hysterectomy      partial  . Thyroid surgery      Brynda Greathouse, MS RD LDN Clinical Inpatient Dietitian Weekend/After hours pager: [phone]

## 2014-09-24 NOTE — Care Management Note (Addendum)
    Page 1 of 2   09/28/2014     1:36:34 PM CARE MANAGEMENT NOTE 09/28/2014  Patient:  [name], [name]   Account Number:  [id number]  Date Initiated:  09/24/2014  Documentation initiated by:  DAVIS,RHONDA  Subjective/Objective Assessment:   patient fell at home/sustained a Left hip fracture (displaced femoral neck fracture)to or pm of [id number].     Action/Plan:   home vs str-snf  lives at home and has hha 7a-9pm through home instead   Anticipated DC Date:  09/28/2014   Anticipated DC Plan:  SKILLED NURSING FACILITY  In-house referral  Clinical Social Worker      DC Planning Services  CM consult      [facility] Choice  Resumption Of Svcs/PTA Provider  DURABLE MEDICAL EQUIPMENT   Choice offered to / List presented to:  NA   DME arranged  [place]      DME agency  [place] arranged  HH-2 PT  HH-3 OT  HH-1 RN  [place]   Status of service:  Completed, signed off Medicare Important Message given?  YES (If response is "NO", the following Medicare IM given date fields will be blank) Date Medicare IM given:  09/28/2014 Medicare IM given by:  [facility] Date Additional Medicare IM given:   Additional Medicare IM given by:    Discharge Disposition:  [place]  Per UR Regulation:  Reviewed for med. necessity/level of care/duration of stay  If discussed at [place] of Stay Meetings, dates discussed:    Comments:  09-28-14 [place] 1300 Spoke with [name] this am, she is ready for patient to d/c today. Requesting addition equipment including hoyer lift that was recommended. She would like to use [place] for [facility] services but has no out of network coverage per [place] so is ok to continue with [facility]. [name] has made arrangements with [facility] for 24/7 care. CSW has arranged transportation. Awaiting  approval from [facility] for equipment delivery from [place].  09-27-14 Sunday Spillers RN CM 1000 Spoke with patient's daughter, [name] at [phone]. She is wanting patient to return to [place] with 24/7 from [facility] Staff and family. She is concerned about patient's mobility status. I talked with her about SNF but she states had a bad experience and does not want her at a SNF. Also concerned it would worsen Dementia. Patient is active with [facility], confirmed with [facility] from [place]. Encouraged daughter to have arrangements in place for d/c today or tomorrow. Daughter is requesting transportation assistance. Daughter is awaiting call from attending.  [place], RN, BSN, [place] [phone] Chart Reviewed. Discharge needs at time of review: None present will follow for needs.

## 2014-09-24 NOTE — Progress Notes (Signed)
Patient ID: [name], female   DOB: 04-27-1931, 80 y.o.   MRN: [id number] Will proceed to the OR for a left total hip replacement to treat her left femoral neck fracture.  Her daughts give informed consent and understand fully the risks and benefits involved.

## 2014-09-24 NOTE — Progress Notes (Signed)
CSW consulted due to hip fx. CSW spoke with daughter Lequita Asal [phone] ) to assist with d/c planning. Pt will have 24/7 assistance at home following hospital d/c. Pt has been to SNF in the past and did poorly there due to dementia. Family would like [facility]. RNCM /MD updated. CSW is available to assist with SNF placement if plan changes. Daughter has CSW cell # and will contact CSW if assistance needed.  Werner Lean LCSW [phone]

## 2014-09-24 NOTE — Progress Notes (Signed)
[facility] PROGRESS NOTE  [name] JSH:[id number] DOB: 1931/05/24 DOA: 09/23/2014 PCP: [name], MD  Brief narrative 80 year old female with parkinsonism and vascular dementia, history of TIAs/stroke, hypothyroidism presented to the ED after sustaining a mechanical fall at home and found to have left femoral neck fracture. She admitted to hospitalist service with orthopedic consultation.  Assessment/Plan: Left femoral neck fracture -secondary to mechanical fall -pain control with Vicodin when necessary every 4 hours and IV morphine every 3 hours as needed for severe pain. Added Senokot. -Gentle IV hydration. Plan for surgery this afternoon -holding Plavix  Active Problems:  Vascular / Parkinson's dementia Patient seen by neurology recently for shuffling gait and considered this likely to be parkinsonism with severe vascular dementia.Patient also has long history of alcohol abuse. She has 24-hour home care. Currently not on any medications. -Follow-up with neurology as outpatient  hypothyroidism Continue Synthroid Recent TSH normal  History of TIA/ stroke Hold Plavix. lipid panel suggests dyslipidemia. Will add Lipitor.    Essential hypertension, benign Stable. Not on any medications     Diet:nothing by mouth for OR today  DVT prophylaxis: sq lovenox   Code Status: full code Family Communication: updated daughter Disposition Plan: skilled nursing facility versus home with home health (has 24-hour care at home)  HPI/Subjective: Patient seen and examined this morning. Appears quite confused.caregiver at bedside  Objective: Filed Vitals:   09/24/14 0431  BP: 151/57  Pulse: 80  Temp: 98.4 F (36.9 C)  Resp: 16    Intake/Output Summary (Last 24 hours) at 09/24/14 1314 Last data filed at 09/24/14 1000  Gross per 24 hour  Intake 1126.25 ml  Output   2100 ml  Net -973.75 ml   There were no vitals filed for this visit.  Exam:   General:   Elderly female in no acute distress, flat a fake  HEENT, moist oral mucosa  Chest: Clear to auscultation bilaterally   CVS: S1 and S2 normal, 3/6 systolic murmur  Abdomen: Soft, nondistended, nontender  Extremities: Warm, limited mobility of left hip  CNS: Alert and oriented to self only, confused   Data Reviewed: Basic Metabolic Panel:  Recent Labs Lab 09/23/14 1355  NA 132*  K 4.2  CL 95*  CO2 25  GLUCOSE 100*  BUN 12  CREATININE 0.54  CALCIUM 9.1   Liver Function Tests:  Recent Labs Lab 09/23/14 1355  AST 22  ALT 14  ALKPHOS 88  BILITOT 0.3  PROT 7.2  ALBUMIN 3.6   No results for input(s): LIPASE, AMYLASE in the last 168 hours. No results for input(s): AMMONIA in the last 168 hours. CBC:  Recent Labs Lab 09/23/14 1355  WBC 7.1  HGB 12.1  HCT 35.9*  MCV 89.8  PLT 298   Cardiac Enzymes: No results for input(s): CKTOTAL, CKMB, CKMBINDEX, TROPONINI in the last 168 hours. BNP (last 3 results)  Recent Labs  08/18/14 0918  PROBNP 87.0   CBG: No results for input(s): GLUCAP in the last 168 hours.  Recent Results (from the past 240 hour(s))  Surgical pcr screen     Status: None   Collection Time: 09/24/14 10:41 AM  Result Value Ref Range Status   MRSA, PCR NEGATIVE NEGATIVE Final   Staphylococcus aureus NEGATIVE NEGATIVE Final    Comment:        The Xpert SA Assay (FDA approved for NASAL specimens in patients over 32 years of age), is one component of a comprehensive surveillance program.  Test performance has been  validated by [facility] for patients greater than or equal to 27 year old. It is not intended to diagnose infection nor to guide or monitor treatment.      Studies: Dg Chest 1 View  09/23/2014   CLINICAL DATA:  Pre operative respiratory exam. Acute left hip fracture.  EXAM: CHEST - 1 VIEW  COMPARISON:  02/14/2011  FINDINGS: Heart size and pulmonary vascularity are normal and the lungs are clear. No acute osseous  abnormality. Multiple old healed right rib fractures. Fairly severe chronic rotator cuff disease bilaterally.  IMPRESSION: No acute abnormalities.   Electronically Signed   By: Rozetta Nunnery M.D.   On: 09/23/2014 14:36   Dg Hip Complete Left  09/23/2014   CLINICAL DATA:  Left hip pain secondary to a fall in her living room.  EXAM: LEFT HIP - COMPLETE 2+ VIEW  COMPARISON:  None.  FINDINGS: There is an overriding slightly angulated subcapital fracture of the left femoral neck.  No other acute abnormality. Moderately severe arthritis of the right hip with evidence of old right ischial fractures.  IMPRESSION: Fracture of the left femoral neck.   Electronically Signed   By: Rozetta Nunnery M.D.   On: 09/23/2014 14:34    Scheduled Meds: . enoxaparin (LOVENOX) injection  40 mg Subcutaneous Q24H  . levothyroxine  112 mcg Oral QAC breakfast  . multivitamin with minerals  1 tablet Oral Daily  . ondansetron (ZOFRAN) IV  4 mg Intravenous Once  . senna  1 tablet Oral BID   Continuous Infusions: . sodium chloride    . sodium chloride 75 mL/hr at 09/24/14 0459      Time spent: 25 minutes    Blessyn [place], [place]  [facility] Pager [phone]. If 7PM-7AM, please contact night-coverage at [email or web address], password [facility] 09/24/2014, 1:14 PM  LOS: 1 day

## 2014-09-24 NOTE — Brief Op Note (Signed)
09/23/2014 - 09/24/2014  5:01 PM  PATIENT:  [name]  80 y.o. female  PRE-OPERATIVE DIAGNOSIS:  LEFT HIP FEMORAL NECK FRACTURE  POST-OPERATIVE DIAGNOSIS:  LEFT HIP FEMORAL NECK FRACTURE  PROCEDURE:  Procedure(s): TOTAL HIP ARTHROPLASTY ANTERIOR APPROACH, LEFT (Left)  SURGEON:  Surgeon(s) and Role:    * Mcarthur Rossetti, MD - Primary  PHYSICIAN ASSISTANT: Benita Stabile, PA-C  ANESTHESIA:   general  EBL:  Total I/O In: 800 [I.V.:800] Out: 3403 [Urine:1125; Blood:100]  BLOOD ADMINISTERED:none  DRAINS: none   LOCAL MEDICATIONS USED:  NONE  SPECIMEN:  No Specimen  DISPOSITION OF SPECIMEN:  N/A  COUNTS:  YES  TOURNIQUET:  * No tourniquets in log *  DICTATION: .Other Dictation: Dictation Number [id number]  PLAN OF CARE: Admit to inpatient   PATIENT DISPOSITION:  PACU - hemodynamically stable.   Delay start of Pharmacological VTE agent (>24hrs) due to surgical blood loss or risk of bleeding: no

## 2014-09-24 NOTE — Transfer of Care (Signed)
Immediate Anesthesia Transfer of Care Note  Patient: [name]  Procedure(s) Performed: Procedure(s) (LRB): TOTAL HIP ARTHROPLASTY ANTERIOR APPROACH, LEFT (Left)  Patient Location: PACU  Anesthesia Type: General  Level of Consciousness: sedated, patient cooperative and responds to stimulation  Airway & Oxygen Therapy: Patient Spontanous Breathing and Patient connected to face mask oxgen  Post-op Assessment: Report given to PACU RN and Post -op Vital signs reviewed and stable  Post vital signs: Reviewed and stable  Complications: No apparent anesthesia complications

## 2014-09-24 NOTE — Anesthesia Preprocedure Evaluation (Signed)
Anesthesia Evaluation  Patient identified by MRN, date of birth, ID band Patient awake    Reviewed: Allergy & Precautions, H&P , NPO status , Patient's Chart, lab work & pertinent test results  Airway Mallampati: II  TM Distance: >3 FB Neck ROM: full    Dental no notable dental hx. (+) Teeth Intact, Dental Advisory Given   Pulmonary neg pulmonary ROS,  breath sounds clear to auscultation  Pulmonary exam normal       Cardiovascular Exercise Tolerance: Good negative cardio ROS  Rhythm:regular Rate:Normal  RBBB   Neuro/Psych Depression dementia TIACVA negative psych ROS   GI/Hepatic negative GI ROS, Neg liver ROS, (+)     substance abuse  alcohol use, Past alcohol abuse   Endo/Other  negative endocrine ROSHypothyroidism   Renal/GU negative Renal ROS  negative genitourinary   Musculoskeletal   Abdominal   Peds  Hematology negative hematology ROS (+)   Anesthesia Other Findings   Reproductive/Obstetrics negative OB ROS                             Anesthesia Physical Anesthesia Plan  ASA: III  Anesthesia Plan: General   Post-op Pain Management:    Induction: Intravenous  Airway Management Planned: Oral ETT  Additional Equipment:   Intra-op Plan:   Post-operative Plan: Extubation in OR  Informed Consent: I have reviewed the patients History and Physical, chart, labs and discussed the procedure including the risks, benefits and alternatives for the proposed anesthesia with the patient or authorized representative who has indicated his/her understanding and acceptance.   Dental Advisory Given  Plan Discussed with: CRNA and Surgeon  Anesthesia Plan Comments:         Anesthesia Quick Evaluation

## 2014-09-24 NOTE — Anesthesia Postprocedure Evaluation (Signed)
  Anesthesia Post-op Note  Patient: [name]  Procedure(s) Performed: Procedure(s) (LRB): TOTAL HIP ARTHROPLASTY ANTERIOR APPROACH, LEFT (Left)  Patient Location: PACU  Anesthesia Type: General  Level of Consciousness: awake and alert   Airway and Oxygen Therapy: Patient Spontanous Breathing  Post-op Pain: mild  Post-op Assessment: Post-op Vital signs reviewed, Patient's Cardiovascular Status Stable, Respiratory Function Stable, Patent Airway and No signs of Nausea or vomiting  Last Vitals:  Filed Vitals:   09/24/14 1838  BP: 143/64  Pulse: 96  Temp: 36.3 C  Resp: 13    Post-op Vital Signs: stable   Complications: No apparent anesthesia complications

## 2014-09-25 LAB — CBC
HEMATOCRIT: 26.2 % — AB (ref 36.0–46.0)
Hemoglobin: 9 g/dL — ABNORMAL LOW (ref 12.0–15.0)
MCH: 30.1 pg (ref 26.0–34.0)
MCHC: 34.4 g/dL (ref 30.0–36.0)
MCV: 87.6 fL (ref 78.0–100.0)
PLATELETS: 271 10*3/uL (ref 150–400)
RBC: 2.99 MIL/uL — ABNORMAL LOW (ref 3.87–5.11)
RDW: 13 % (ref 11.5–15.5)
WBC: 10.6 10*3/uL — AB (ref 4.0–10.5)

## 2014-09-25 LAB — BASIC METABOLIC PANEL
ANION GAP: 11 (ref 5–15)
BUN: 9 mg/dL (ref 6–23)
CALCIUM: 8.2 mg/dL — AB (ref 8.4–10.5)
CHLORIDE: 98 meq/L (ref 96–112)
CO2: 24 mEq/L (ref 19–32)
CREATININE: 0.48 mg/dL — AB (ref 0.50–1.10)
GFR calc Af Amer: >90 mL/min (ref >90)
GFR calc non Af Amer: 90 mL/min — ABNORMAL LOW (ref >90)
Glucose, Bld: 153 mg/dL — ABNORMAL HIGH (ref 70–99)
Potassium: 4.1 mEq/L (ref 3.7–5.3)
Sodium: 133 mEq/L — ABNORMAL LOW (ref 137–147)

## 2014-09-25 MED ORDER — HALOPERIDOL LACTATE 5 MG/ML IJ SOLN: 0.5000 mg | Freq: Four times a day (QID) | INTRAMUSCULAR | Status: DC | PRN

## 2014-09-25 MED ORDER — METHOCARBAMOL 500 MG PO TABS
500.0000 mg | ORAL_TABLET | Freq: Three times a day (TID) | ORAL | Status: DC
  Administered 2014-09-25 – 2014-09-27 (×4): 500 mg via ORAL
  Filled 2014-09-25 (×8): qty 1

## 2014-09-25 MED ORDER — LORAZEPAM 1 MG PO TABS
1.0000 mg | ORAL_TABLET | Freq: Two times a day (BID) | ORAL | Status: DC | PRN
  Administered 2014-09-25: 1 mg via ORAL
  Filled 2014-09-25: qty 1

## 2014-09-25 MED ORDER — LIP MEDEX EX OINT
TOPICAL_OINTMENT | CUTANEOUS | Status: AC
  Administered 2014-09-25: 10:00:00
  Filled 2014-09-25: qty 7

## 2014-09-25 NOTE — Progress Notes (Signed)
Subjective: 1 Day Post-Op Procedure(s) (LRB): TOTAL HIP ARTHROPLASTY ANTERIOR APPROACH, LEFT (Left) Patient reports pain as mild.    Objective: Vital signs in last 24 hours: Temp:  [97.4 F (36.3 C)-98.7 F (37.1 C)] 97.7 F (36.5 C) (11/07 0600) Pulse Rate:  [72-113] 97 (11/07 0600) Resp:  [12-17] 12 (11/07 0600) BP: (86-153)/(46-76) 149/68 mmHg (11/07 0600) SpO2:  [94 %-100 %] 100 % (11/07 0600)  Intake/Output from previous day: 11/06 0701 - 11/07 0700 In: 2568.8 [P.O.:50; I.V.:2418.8; IV Piggyback:100] Out: 1750 [Urine:1650; Blood:100] Intake/Output this shift:     Recent Labs  09/23/14 1355 09/25/14 0525  HGB 12.1 9.0*    Recent Labs  09/23/14 1355 09/25/14 0525  WBC 7.1 10.6*  RBC 4.00 2.99*  HCT 35.9* 26.2*  PLT 298 271     Recent Labs  09/23/14 1355 09/24/14 0500  INR 1.05 1.14    Awake, daughter at bedside-tough night with confusion-perhaps worse since admission-comfortable at present, left hip wound clean,dry-has had numerous TIA's in past with "drooping of left foot" per daughter-no apparent difference post op  Assessment/Plan: 1 Day Post-Op Procedure(s) (LRB): TOTAL HIP ARTHROPLASTY ANTERIOR APPROACH, LEFT (Left) Advance diet Up with therapy, monitor H&H-presently asymptomatic, will be going home to retirement home at D/C  [facility], [place]ie Vawter W 09/25/2014, 9:43 AM

## 2014-09-25 NOTE — Plan of Care (Signed)
Problem: Consults Goal: Total Joint Replacement Patient Education See Patient Education Module for education specifics. Outcome: Completed/Met Date Met:  09/25/14 Goal: Diagnosis- Total Joint Replacement Outcome: Completed/Met Date Met:  09/25/14 Primary Total Hip LEFT, Anterior Goal: Skin Care Protocol Initiated - if Braden Score 18 or less If consults are not indicated, leave blank or document N/A Outcome: Not Applicable Date Met:  48/27/07 Goal: Nutrition Consult-if indicated Outcome: Completed/Met Date Met:  09/25/14 Goal: Diabetes Guidelines if Diabetic/Glucose > 140 If diabetic or lab glucose is > 140 mg/dl - Initiate Diabetes/Hyperglycemia Guidelines & Document Interventions  Outcome: Not Applicable Date Met:  86/75/44  Problem: Phase I Progression Outcomes Goal: CMS/Neurovascular status WDL Outcome: Completed/Met Date Met:  09/25/14 Goal: Pain controlled with appropriate interventions Outcome: Completed/Met Date Met:  09/25/14 Goal: Dangle or out of bed evening of surgery Outcome: Not Met (add Reason) Poor concentration, unable to follow directions. Goal: Initial discharge plan identified Outcome: Completed/Met Date Met:  09/25/14 Goal: Hemodynamically stable Outcome: Completed/Met Date Met:  09/25/14 Goal: Other Phase I Outcomes/Goals Outcome: Not Applicable Date Met:  92/01/00  Problem: Phase II Progression Outcomes Goal: Discharge plan established Outcome: Completed/Met Date Met:  09/25/14 Goal: Other Phase II Outcomes/Goals Outcome: Not Applicable Date Met:  71/21/97

## 2014-09-25 NOTE — Evaluation (Signed)
Physical Therapy Evaluation Patient Details Name: [name] MRN: [id number] DOB: 07-Mar-1931 Today's Date: 09/25/2014   History of Present Illness  s/p L DA THA; PMHx; dementia, CVA  Clinical Impression  Pt admitted with left hip fx-->s/p L DA THA. Pt currently with functional limitations due to the deficits listed below (see PT Problem List).  Pt will benefit from skilled PT to increase their independence and safety with mobility to allow discharge to the venue listed below. Dtr reports they will likely hire incr assist (24hr) for pt at [place] [facility] PT;SNF (vs--may go back to [facility] with 24hr assist)    Equipment Recommendations  None recommended by PT    Recommendations for Other Services       Precautions / Restrictions Precautions Precautions: Fall Restrictions LLE Weight Bearing: Weight bearing as tolerated      Mobility  Bed Mobility Overal bed mobility: Needs Assistance;+2 for physical assistance Bed Mobility: Supine to Sit     Supine to sit: Max assist     General bed mobility comments: incr time, +2 for trunk and LEs  Transfers Overall transfer level: Needs assistance Equipment used: Rolling walker (2 wheeled) Transfers: Sit to/from [facility] Sit to Stand: +2 physical assistance;Max assist Stand pivot transfers: Max assist;+2 physical assistance       General transfer comment: multi-modal cues for wt shift,  hand placement, trunk and hip extension  Ambulation/Gait Ambulation/Gait assistance: +2 physical assistance Ambulation Distance (Feet): 4 Feet Assistive device: Rolling walker (2 wheeled) Gait Pattern/deviations: Shuffle     General Gait Details: multi-modal cues for safety, technique, sequencing  Stairs            Wheelchair Mobility    Modified Rankin (Stroke Patients Only)       Balance Overall balance assessment: History of Falls;Needs assistance          Standing balance support: Bilateral upper extremity supported Standing balance-Leahy Scale: Zero                               Pertinent Vitals/Pain Pain Assessment: 0-10 Pain Location: hip    Home Living Family/patient expects to be discharged to:: Unsure                 Additional Comments: [employer]; had sitter during day and was alone at night    Prior Function Level of Independence: Independent with assistive device(s)               Hand Dominance        Extremity/Trunk Assessment   Upper Extremity Assessment: Defer to OT evaluation           Lower Extremity Assessment: LLE deficits/detail;Generalized weakness         Communication   Communication: No difficulties  Cognition Arousal/Alertness: Awake/alert Behavior During Therapy: WFL for tasks assessed/performed Overall Cognitive Status: Within Functional Limits for tasks assessed                      General Comments      Exercises Total Joint Exercises Ankle Circles/Pumps: Both;10 reps;AAROM      Assessment/Plan    PT Assessment Patient needs continued PT services  PT Diagnosis Difficulty walking;Generalized weakness;Acute pain   PT Problem List Decreased strength;Decreased activity tolerance;Decreased balance;Decreased mobility;Decreased range of motion;Pain  PT Treatment Interventions DME instruction;Gait training;Functional mobility training;Therapeutic activities;Therapeutic exercise;Patient/family  education   PT Goals (Current goals can be found in the Care Plan section) Acute Rehab PT Goals PT Goal Formulation: Patient unable to participate in goal setting Time For Goal Achievement: 10/09/14 Potential to Achieve Goals: Good    Frequency Min 3X/week   Barriers to discharge        Co-evaluation               End of Session Equipment Utilized During Treatment: Gait belt Activity Tolerance: Patient tolerated treatment well Patient  left: in chair;with call bell/phone within reach;with family/visitor present Nurse Communication: Mobility status         Time: 1020-1038 PT Time Calculation (min): 18 min   Charges:   PT Evaluation $Initial PT Evaluation Tier I: 1 Procedure PT Treatments $Therapeutic Activity: 8-22 mins   PT G Codes:          [name] 10-01-2014, 10:47 AM

## 2014-09-25 NOTE — Progress Notes (Signed)
[facility] PROGRESS NOTE  [name] ZHY:[id number] DOB: Mar 03, 1931 DOA: 09/23/2014 PCP: [name], MD  Brief narrative 80 year old female with parkinsonism and vascular dementia, history of TIAs/stroke, hypothyroidism presented to the ED after sustaining a mechanical fall at home and found to have left femoral neck fracture. She admitted to hospitalist service with orthopedic consultation.  Assessment/Plan: Left femoral neck fracture -secondary to mechanical fall -she underwent left total hip arthroplasty on 11/6 and tolerated well -pain control with Vicodin when necessary every 4 hours and IV morphine every 3 hours as needed for severe pain. lag scheduled Robaxin Added Senokot. -resume Plavix -family interested in patient being discharged home since she has 24-hour care.  Active Problems:  Vascular / Parkinson's dementia Patient seen by neurology recently for shuffling gait and considered this likely to be parkinsonism with severe vascular dementia.Patient also has long history of alcohol abuse. She has 24-hour home care. Currently not on any medications. -Follow-up with neurology as outpatient -check EKG and if QTc stable will place on low dose prn haldol for agitation  hypothyroidism Continue Synthroid Recent TSH normal  History of TIA/ stroke Resume Plavix. lipid panel suggests dyslipidemia. Added Lipitor    Essential hypertension, benign Stable. Not on any medications     Diet:heart healthy  DVT prophylaxis: sq lovenox   Code Status: full code Family Communication: daughter [name] at bedside Disposition Plan: skilled nursing facility versus home with home health (has 24-hour care at home)  HPI/Subjective: Patient seen and examined. Appears quite confused. Reportedly was agitated overnight  Objective: Filed Vitals:   09/25/14 1315  BP: 105/49  Pulse: 104  Temp: 98.2 F (36.8 C)  Resp: 12    Intake/Output Summary (Last 24 hours) at  09/25/14 1432 Last data filed at 09/25/14 1300  Gross per 24 hour  Intake 2688.75 ml  Output   1200 ml  Net 1488.75 ml   There were no vitals filed for this visit.  Exam:   General:  Elderly female in no acute distress  HEENT: Moist oral mucosa  Chest: Clear to auscultation bilaterally  CVS: Normal S1 and S2 3/6 systolic murmur  Abdomen: Soft, nondistended, nontender  Extremities: Dressing over left hip appears clean  CNS: Oriented to self only, confused    Data Reviewed: Basic Metabolic Panel:  Recent Labs Lab 09/23/14 1355 09/25/14 0525  NA 132* 133*  K 4.2 4.1  CL 95* 98  CO2 25 24  GLUCOSE 100* 153*  BUN 12 9  CREATININE 0.54 0.48*  CALCIUM 9.1 8.2*   Liver Function Tests:  Recent Labs Lab 09/23/14 1355  AST 22  ALT 14  ALKPHOS 88  BILITOT 0.3  PROT 7.2  ALBUMIN 3.6   No results for input(s): LIPASE, AMYLASE in the last 168 hours. No results for input(s): AMMONIA in the last 168 hours. CBC:  Recent Labs Lab 09/23/14 1355 09/25/14 0525  WBC 7.1 10.6*  HGB 12.1 9.0*  HCT 35.9* 26.2*  MCV 89.8 87.6  PLT 298 271   Cardiac Enzymes: No results for input(s): CKTOTAL, CKMB, CKMBINDEX, TROPONINI in the last 168 hours. BNP (last 3 results)  Recent Labs  08/18/14 0918  PROBNP 87.0   CBG: No results for input(s): GLUCAP in the last 168 hours.  Recent Results (from the past 240 hour(s))  Surgical pcr screen     Status: None   Collection Time: 09/24/14 10:41 AM  Result Value Ref Range Status   MRSA, PCR NEGATIVE NEGATIVE Final   Staphylococcus aureus NEGATIVE  NEGATIVE Final    Comment:        The Xpert SA Assay (FDA approved for NASAL specimens in patients over 26 years of age), is one component of a comprehensive surveillance program.  Test performance has been validated by [employer] for patients greater than or equal to 26 year old. It is not intended to diagnose infection nor to guide or monitor treatment.       Studies: Dg Hip Complete Left  09/24/2014   CLINICAL DATA:  Left hip replacement.  EXAM: DG C-ARM 1-60 MIN - NRPT [facility]; LEFT HIP - COMPLETE 2+ VIEW  COMPARISON:  09/23/2014.  FINDINGS: Total left hip replacement with good anatomic alignment. Prostheses intact.  IMPRESSION: Total left hip replacement with good anatomic alignment.   Electronically Signed   By: Marcello Moores  Register   On: 09/24/2014 17:07   Pelvis Portable  09/24/2014   CLINICAL DATA:  Status post total left hip arthroplasty.  EXAM: PORTABLE PELVIS 1-2 VIEWS  COMPARISON:  Operative images obtained this same date.  FINDINGS: Single AP view shows the left hip femoral and acetabular prosthetic components to be well-seated and aligned. There is no acute fracture or evidence of an operative complication. There are prominent arthropathic changes of the right hip. Bones are diffusely demineralized.  IMPRESSION: Well aligned left hip prosthesis.   Electronically Signed   By: Lajean Manes M.D.   On: 09/24/2014 18:55   Dg C-arm 1-60 Min-no Report  09/24/2014   CLINICAL DATA:  Left hip replacement.  EXAM: DG C-ARM 1-60 MIN - NRPT [facility]; LEFT HIP - COMPLETE 2+ VIEW  COMPARISON:  09/23/2014.  FINDINGS: Total left hip replacement with good anatomic alignment. Prostheses intact.  IMPRESSION: Total left hip replacement with good anatomic alignment.   Electronically Signed   By: Marcello Moores  Register   On: 09/24/2014 17:07    Scheduled Meds: . atorvastatin  20 mg Oral q1800  . clopidogrel  75 mg Oral Daily  . levothyroxine  112 mcg Oral QAC breakfast  . methocarbamol  500 mg Oral TID  . multivitamin with minerals  1 tablet Oral Daily  . ondansetron (ZOFRAN) IV  4 mg Intravenous Once  . senna  1 tablet Oral BID  . senna  1 tablet Oral BID   Continuous Infusions: . sodium chloride        Time spent: 25 minutes    Shiquan Mathieu, [place]  [facility] Pager [phone] If 7PM-7AM, please contact night-coverage at [email or web address], password  [facility] 09/25/2014, 2:32 PM  LOS: 2 days

## 2014-09-25 NOTE — Op Note (Signed)
NAMEJAIMEY, [name]                ACCOUNT NO.:  [id number]  MEDICAL RECORD NO.:  [id number]  LOCATION:  [facility]                         FACILITY:  [facility]  PHYSICIAN:  Lind Guest. Ninfa Linden, M.D.DATE OF BIRTH:  Dec 18, 1930  DATE OF PROCEDURE:  09/24/2014 DATE OF DISCHARGE:                              OPERATIVE REPORT   PREOPERATIVE DIAGNOSIS:  Left hip displaced femoral neck fracture.  POSTOPERATIVE DIAGNOSIS:  Left hip displaced femoral neck fracture.  PROCEDURE:  Left total hip arthroplasty through direct anterior approach.  IMPLANTS:  DePuy Sector Gription acetabular component size 48 with apex eliminator guide and a single screw, size 32+ 0 neutral polyethylene liner, size 10 Corail femoral component with varus offset, size 32+ 1 metal hip ball.  SURGEON:  Lind Guest. Ninfa Linden, MD  ASSISTANT:  Erskine Emery, PA-C  ANESTHESIA:  General.  ANTIBIOTICS:  2 g IV Ancef.  BLOOD LOSS:  Less than 500 mL.  COMPLICATIONS:  None.  INDICATIONS:  Ms. Stricker is an 80 year old female who sustained a mechanical fall yesterday.  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  She has significantly shortened on that left hip and does have pre-existing arthritis and preexisting hip pain.  I do believe she is a perfect candidate for an anterior hip replacement as opposed to a hemiarthroplasty due to the less effect on muscles and with not having to deal with any hip precautions that would help her mobility and quality of life.  Her family did understand the risk of acute blood loss anemia, nerve and vessel injury, fracture, infection, dislocation, and perioperative issues related to her multiple medical problems.  They understand we need to perform general anesthesia because she has been on Plavix as well.  She is having significant pain from her shortened and externally rotated fully fractured and displaced femoral neck  fracture. Informed consent was obtained.  PROCEDURE DESCRIPTION:  After informed consent was obtained, appropriate left hip was marked.  She was brought to the operating room and general anesthesia was obtained while she was on her stretcher.  Traction boots were placed on both her feet and neck.  She was placed supine on the Hana fracture table with the perineal post in place and both legs in inline skeletal traction devices, but no traction applied.  Her left operative hip was then prepped and draped with DuraPrep and sterile drapes.  A time-out was called to identify correct patient, correct left hip.  We then made an incision inferior and posterior to the anterior superior iliac spine and carried this obliquely down the leg.  We dissected down to the tensor fascia lata muscle and tensor fascia was divided longitudinally, so we could proceed with a direct anterior approach to the hip.  We cauterized the lateral femoral circumflex vessels and then opened up her hip capsule in an L-type format pending a large hematoma secondary to her fractured femoral neck.  We then placed Cobra retractors within the capsule and made our femoral neck cut.  I freshened up her femoral neck cut proximal to the lesser trochanter.  We placed a corkscrew guide in the femoral  head and removed the femoral head in its entirety.  We then placed a bent Hohmann medially and a Cobra retractor laterally.  We cleaned remnants of acetabular labrum and then began reaming from a size 42 reamer up to a size 48, she had a tight fit with the 48, and so we placed real DePuy Sector Gription acetabular component size 48 and a single screw.  Due to her very soft bone, I did penetrate the inner table of the pelvis knowingly to get a tight fit.  We then placed the real 32+ 0 neutral polyethylene liner. Attention was then turned to the femur with the leg externally rotated to 100 degrees extended and abducted.  We were able to  gain access to the femoral canal using a box cutting osteotome and we placed a medial retractor and Hohmann retractors as well I used a rongeur to lateralize and then broach up to a size 10 broach in the Corail broaching system and based over anatomy, we trialed a varus offset neck and a 32+ 1 hip ball, reduced this in acetabulum and although she seemed to be short oblique, she was significantly short preop because she was very tight as far as her clinical exam not being short, but radiographically she looked short however again I believe this is related more to her anatomy, it was stable otherwise and I could not dislocate it easily. We then dislocated the hip and removed the trial components.  I placed the real Corail femoral component size 10 and the real 32+ 1 metal hip ball and reduced this back in the acetabulum and it was stable.  We copiously irrigated the soft tissue with normal saline solution using pulsatile lavage.  I closed the joint capsule with interrupted #1 Ethibond suture followed by a running #1 Vicryl, tensor fascia with 0 Vicryl, and the deep tissue, 2-0 Vicryl in subcutaneous tissue, 4-0 Monocryl, subcuticular stitch and Steri-Strips on the skin.  An Aquacel dressing was applied.  She was then taken off of the Hana table, awakened, extubated, and taken to recovery room in stable condition.  Of note, Erskine Emery, PA-C assisted in the entire case and assistance was crucial in completing all aspects of this case.     Lind Guest. Ninfa Linden, M.D.     CYB/MEDQ  D:  09/24/2014  T:  09/25/2014  Job:  [id number]

## 2014-09-25 NOTE — Plan of Care (Signed)
Problem: Phase II Progression Outcomes Goal: Tolerating diet Outcome: Completed/Met Date Met:  09/25/14

## 2014-09-25 NOTE — Progress Notes (Signed)
Physical Therapy Treatment Patient Details Name: [name] MRN: [id number] DOB: 1931-01-13 Today's Date: 2014/10/02    History of Present Illness s/p L DA THA; PMHx; dementia, CVA    PT Comments    Slow progress; family is hoping to hire incr assist and have [facility] come to [place].  Follow Up Recommendations  Home health PT;SNF     Equipment Recommendations  None recommended by PT    Recommendations for Other Services       Precautions / Restrictions Precautions Precautions: Fall Restrictions LLE Weight Bearing: Weight bearing as tolerated    Mobility  Bed Mobility Overal bed mobility: Needs Assistance;+2 for physical assistance Bed Mobility: Sit to Supine       Sit to supine: Total assist;+2 for physical assistance   General bed mobility comments: incr time, +2 for trunk and LEs  Transfers Overall transfer level: Needs assistance Equipment used: None Transfers: Squat Pivot Transfers     Squat pivot transfers: +2 physical assistance;Total assist     General transfer comment: pad used to assist pt; pt unable to follow commands to self assist   Ambulation/Gait                 Stairs            Wheelchair Mobility    Modified Rankin (Stroke Patients Only)       Balance             Standing balance-Leahy Scale: Zero                      Cognition Arousal/Alertness: Awake/alert Behavior During Therapy: Restless Overall Cognitive Status: History of cognitive impairments - at baseline                      Exercises      General Comments        Pertinent Vitals/Pain Pain Assessment: Faces Faces Pain Scale: Hurts even more Pain Location: left hip Pain Intervention(s): Limited activity within patient's tolerance;Repositioned (Rn notified)    Home Living                      Prior Function            PT Goals (current goals can now be found in the care plan section) Acute Rehab PT  Goals Patient Stated Goal: none stated PT Goal Formulation: Patient unable to participate in goal setting Time For Goal Achievement: 10/09/14 Potential to Achieve Goals: Good Progress towards PT goals: Progressing toward goals    Frequency  Min 3X/week    PT Plan Current plan remains appropriate    Co-evaluation             End of Session   Activity Tolerance: Patient limited by pain;Other (comment) (cognition) Patient left: in bed;with call bell/phone within reach;with family/visitor present     Time: 2094-7096 PT Time Calculation (min): 16 min  Charges:  $Therapeutic Activity: 8-22 mins                    G Codes:      Joseph Johns Oct 02, 2014, 4:19 PM

## 2014-09-26 DIAGNOSIS — D62 Acute posthemorrhagic anemia: Secondary | ICD-10-CM

## 2014-09-26 LAB — BASIC METABOLIC PANEL
Anion gap: 11 (ref 5–15)
BUN: 15 mg/dL (ref 6–23)
CALCIUM: 8.3 mg/dL — AB (ref 8.4–10.5)
CO2: 24 mEq/L (ref 19–32)
Chloride: 100 mEq/L (ref 96–112)
Creatinine, Ser: 0.49 mg/dL — ABNORMAL LOW (ref 0.50–1.10)
GFR, EST NON AFRICAN AMERICAN: 89 mL/min — AB (ref >90)
GLUCOSE: 137 mg/dL — AB (ref 70–99)
Potassium: 4 mEq/L (ref 3.7–5.3)
SODIUM: 135 meq/L — AB (ref 137–147)

## 2014-09-26 LAB — URINALYSIS, ROUTINE W REFLEX MICROSCOPIC
Bilirubin Urine: NEGATIVE
Glucose, UA: NEGATIVE mg/dL
KETONES UR: NEGATIVE mg/dL
NITRITE: POSITIVE — AB
Protein, ur: NEGATIVE mg/dL
Specific Gravity, Urine: 1.011 (ref 1.005–1.030)
UROBILINOGEN UA: 1 mg/dL (ref 0.0–1.0)
pH: 6 (ref 5.0–8.0)

## 2014-09-26 LAB — URINE MICROSCOPIC-ADD ON

## 2014-09-26 LAB — CBC
HCT: 23.1 % — ABNORMAL LOW (ref 36.0–46.0)
HEMOGLOBIN: 7.9 g/dL — AB (ref 12.0–15.0)
MCH: 30.3 pg (ref 26.0–34.0)
MCHC: 34.2 g/dL (ref 30.0–36.0)
MCV: 88.5 fL (ref 78.0–100.0)
Platelets: 229 10*3/uL (ref 150–400)
RBC: 2.61 MIL/uL — ABNORMAL LOW (ref 3.87–5.11)
RDW: 13.3 % (ref 11.5–15.5)
WBC: 9.7 10*3/uL (ref 4.0–10.5)

## 2014-09-26 MED ORDER — ALPRAZOLAM 0.25 MG PO TABS
0.2500 mg | ORAL_TABLET | Freq: Two times a day (BID) | ORAL | Status: DC | PRN
  Administered 2014-09-26 – 2014-09-28 (×3): 0.25 mg via ORAL
  Filled 2014-09-26 (×4): qty 1

## 2014-09-26 MED ORDER — ASPIRIN 325 MG PO TABS
325.0000 mg | ORAL_TABLET | Freq: Every day | ORAL | Status: DC
  Administered 2014-09-26 – 2014-09-28 (×3): 325 mg via ORAL
  Filled 2014-09-26 (×3): qty 1

## 2014-09-26 NOTE — Progress Notes (Addendum)
Physical Therapy Treatment Patient Details Name: [name] MRN: [id number] DOB: 15-Jul-1931 Today's Date: 10/17/2014    History of Present Illness s/p L DA THA; PMHx; dementia, CVA    PT Comments    Slowly progressing, limited by cognition and pain  Follow Up Recommendations  Supervision/Assistance - 24 hour;Home health PT     Equipment Recommendations  Other (comment);3in1 (PT) Hoyer lift    Recommendations for Other Services       Precautions / Restrictions Precautions Precautions: Fall Restrictions LLE Weight Bearing: Weight bearing as tolerated    Mobility  Bed Mobility Overal bed mobility: Needs Assistance;+2 for physical assistance Bed Mobility: Supine to Sit     Supine to sit: +2 for physical assistance;Max assist     General bed mobility comments: incr time, +2 for trunk and LEs  Transfers Overall transfer level: Needs assistance Equipment used: Rolling walker (2 wheeled) Transfers: Sit to/from [facility] Sit to Stand: +2 physical assistance;Total assist Stand pivot transfers: Max assist;+2 physical assistance       General transfer comment: multi-modal cues for all aspects of mobility  Ambulation/Gait                 Stairs            Wheelchair Mobility    Modified Rankin (Stroke Patients Only)       Balance                                    Cognition Arousal/Alertness: Awake/alert Behavior During Therapy: Restless Overall Cognitive Status: History of cognitive impairments - at baseline                      Exercises  heel slides, AAROM left x 10    General Comments        Pertinent Vitals/Pain Pain Assessment: Faces Faces Pain Scale: Hurts whole lot Pain Location: left hip Pain Intervention(s): Monitored during session;Premedicated before session    Home Living                      Prior Function            PT Goals (current goals can now be found in  the care plan section) Acute Rehab PT Goals Patient Stated Goal: none stated PT Goal Formulation: Patient unable to participate in goal setting Time For Goal Achievement: 10/09/14 Potential to Achieve Goals: Good Progress towards PT goals: Progressing toward goals    Frequency  Min 3X/week    PT Plan Current plan remains appropriate    Co-evaluation             End of Session Equipment Utilized During Treatment: Gait belt Activity Tolerance: Other (comment) (cognition) Patient left: in chair;with call bell/phone within reach;with family/visitor present     Time: 6811-5726 PT Time Calculation (min): 20 min  Charges:  $Therapeutic Activity: 8-22 mins                    G Codes:      Tanya Marvin 10-17-14, 4:42 PM

## 2014-09-26 NOTE — Progress Notes (Signed)
PT Cancellation Note  Patient Details Name: [name] MRN: [id number] DOB: 02/04/1931   Cancelled Treatment:    Reason Eval/Treat Not Completed: Medical issues which prohibited therapy (Hgb 7.9). Will  Check back tomorrow or as schedule permits   [facility] 09/26/2014, 9:46 AM

## 2014-09-26 NOTE — Progress Notes (Signed)
Subjective: 2 Days Post-Op Procedure(s) (LRB): TOTAL HIP ARTHROPLASTY ANTERIOR APPROACH, LEFT (Left) Patient reports pain as mild.    Objective: Vital signs in last 24 hours: Temp:  [98.2 F (36.8 C)-98.9 F (37.2 C)] 98.7 F (37.1 C) (11/08 0635) Pulse Rate:  [89-104] 89 (11/08 0635) Resp:  [12-13] 12 (11/08 0635) BP: (102-123)/(45-51) 123/45 mmHg (11/08 0635) SpO2:  [96 %-98 %] 98 % (11/08 0635)  Intake/Output from previous day: 11/07 0701 - 11/08 0700 In: 180 [P.O.:180] Out: 1850 [Urine:1850] Intake/Output this shift: Total I/O In: 120 [P.O.:120] Out: -    Recent Labs  09/23/14 1355 09/25/14 0525 09/26/14 0620  HGB 12.1 9.0* 7.9*    Recent Labs  09/25/14 0525 09/26/14 0620  WBC 10.6* 9.7  RBC 2.99* 2.61*  HCT 26.2* 23.1*  PLT 271 229    Recent Labs  09/25/14 0525 09/26/14 0620  NA 133* 135*  K 4.1 4.0  CL 98 100  CO2 24 24  BUN 9 15  CREATININE 0.48* 0.49*  GLUCOSE 153* 137*  CALCIUM 8.2* 8.3*    Recent Labs  09/23/14 1355 09/24/14 0500  INR 1.05 1.14    Awake-seems comfortable,discussed status with hospitalist and daughter-H&H low but pt asympyomatic, wound clean, dry left hip, foley still in place-will remove this am and obtain urine C&S, no change in mental status, also will add ASA to plavix regimen  Assessment/Plan: 2 Days Post-Op Procedure(s) (LRB): TOTAL HIP ARTHROPLASTY ANTERIOR APPROACH, LEFT (Left) Up with therapy Plan for discharge tomorrow with [name], [name] 09/26/2014, 9:49 AM

## 2014-09-26 NOTE — Progress Notes (Signed)
Family at bedside states pt does not take "Cholesterol medicine"  Reviewed PTA meds and there were none

## 2014-09-26 NOTE — Plan of Care (Signed)
Problem: Phase III Progression Outcomes Goal: Pain controlled on oral analgesia Outcome: Completed/Met Date Met:  09/26/14     

## 2014-09-26 NOTE — Progress Notes (Signed)
OT Cancellation Note  Patient Details Name: Jacelyn Cuen MRN: [id number] DOB: 10-Jun-1931   Cancelled Treatment:    Reason Eval/Treat Not Completed: OT screened, no needs identified, will sign off (pt with dementia and requires A with all ADLs at baseline)  Jaxtyn Linville A 09/26/2014, 9:05 AM

## 2014-09-26 NOTE — Progress Notes (Addendum)
[facility] PROGRESS NOTE  Geral Tuch QZE:[id number] DOB: 11-Aug-1931 DOA: 09/23/2014 PCP: [name], MD  Brief narrative 80 year old female with parkinsonism and vascular dementia, history of TIAs/stroke, hypothyroidism presented to the ED after sustaining a mechanical fall at home and found to have left femoral neck fracture. admitted to hospitalist service with orthopedic consultation.  Assessment/Plan: Left femoral neck fracture -secondary to mechanical fall -s/p left total hip arthroplasty on 11/6 and tolerated well -pain control with Vicodin when necessary every 4 hours and IV morphine every 3 hours as needed for severe pain.  scheduled Robaxin her muscle spasms. Added Senokot. -resumed Plavix. Added full dose aspirin for DVT prophylaxis after discussing with orthopedic surgeon (for 4 weeks) -family interested in patient being discharged home since she has 24-hour care.  -  Active Problems:  Acute blood loss anemia Hb dropped to 7.9 today . Will hold off on transfusion today. Recheck in a.m. May transfusion 1 unit PRBC prior to discharge if still low.   Vascular / Parkinson's dementia Patient seen by neurology recently for shuffling gait and considered this likely to be parkinsonism with severe vascular dementia.Patient also has long history of alcohol abuse. She has 24-hour home care. Currently not on any medications. -Follow-up with neurology as outpatient -added when necessary low-dose Xanax for agitation.  hypothyroidism Continue Synthroid Recent TSH normal  History of TIA/ stroke Resume Plavix. lipid panel suggests dyslipidemia. Added Lipitor    Essential hypertension, benign Stable. Not on any medications     Diet:heart healthy  DVT prophylaxis: sq lovenox   Code Status: full code Family Communication: daughter [name] at bedside Disposition Plan: home with home health likely tomorrow.  HPI/Subjective: Patient seen and examined. Patient  sleepy after receiving Vicodin this morning. Family at bedside. No overnight issues  Objective: Filed Vitals:   09/26/14 0635  BP: 123/45  Pulse: 89  Temp: 98.7 F (37.1 C)  Resp: 12    Intake/Output Summary (Last 24 hours) at 09/26/14 1106 Last data filed at 09/26/14 1027  Gross per 24 hour  Intake    240 ml  Output   2025 ml  Net  -1785 ml   There were no vitals filed for this visit.  Exam:   General:  Elderly female lying in bed sleepy and confused  HEENT: Moist oral mucosa  Chest: Clear to auscultation bilaterally  CVS: Normal S1 and S2 3/6 systolic murmur  Abdomen: Soft, nondistended, nontender  Extremities: Dressing over left hip appears clean  CNS: sleepy and sedated,    Data Reviewed: Basic Metabolic Panel:  Recent Labs Lab 09/23/14 1355 09/25/14 0525 09/26/14 0620  NA 132* 133* 135*  K 4.2 4.1 4.0  CL 95* 98 100  CO2 25 24 24   GLUCOSE 100* 153* 137*  BUN 12 9 15   CREATININE 0.54 0.48* 0.49*  CALCIUM 9.1 8.2* 8.3*   Liver Function Tests:  Recent Labs Lab 09/23/14 1355  AST 22  ALT 14  ALKPHOS 88  BILITOT 0.3  PROT 7.2  ALBUMIN 3.6   No results for input(s): LIPASE, AMYLASE in the last 168 hours. No results for input(s): AMMONIA in the last 168 hours. CBC:  Recent Labs Lab 09/23/14 1355 09/25/14 0525 09/26/14 0620  WBC 7.1 10.6* 9.7  HGB 12.1 9.0* 7.9*  HCT 35.9* 26.2* 23.1*  MCV 89.8 87.6 88.5  PLT 298 271 229   Cardiac Enzymes: No results for input(s): CKTOTAL, CKMB, CKMBINDEX, TROPONINI in the last 168 hours. BNP (last 3 results)  Recent Labs  08/18/14 0918  PROBNP 87.0   CBG: No results for input(s): GLUCAP in the last 168 hours.  Recent Results (from the past 240 hour(s))  Surgical pcr screen     Status: None   Collection Time: 09/24/14 10:41 AM  Result Value Ref Range Status   MRSA, PCR NEGATIVE NEGATIVE Final   Staphylococcus aureus NEGATIVE NEGATIVE Final    Comment:        The Xpert SA Assay  (FDA approved for NASAL specimens in patients over 51 years of age), is one component of a comprehensive surveillance program.  Test performance has been validated by [employer] for patients greater than or equal to 63 year old. It is not intended to diagnose infection nor to guide or monitor treatment.      Studies: Dg Hip Complete Left  09/24/2014   CLINICAL DATA:  Left hip replacement.  EXAM: DG C-ARM 1-60 MIN - NRPT [facility]; LEFT HIP - COMPLETE 2+ VIEW  COMPARISON:  09/23/2014.  FINDINGS: Total left hip replacement with good anatomic alignment. Prostheses intact.  IMPRESSION: Total left hip replacement with good anatomic alignment.   Electronically Signed   By: Marcello Moores  Register   On: 09/24/2014 17:07   Pelvis Portable  09/24/2014   CLINICAL DATA:  Status post total left hip arthroplasty.  EXAM: PORTABLE PELVIS 1-2 VIEWS  COMPARISON:  Operative images obtained this same date.  FINDINGS: Single AP view shows the left hip femoral and acetabular prosthetic components to be well-seated and aligned. There is no acute fracture or evidence of an operative complication. There are prominent arthropathic changes of the right hip. Bones are diffusely demineralized.  IMPRESSION: Well aligned left hip prosthesis.   Electronically Signed   By: Lajean Manes M.D.   On: 09/24/2014 18:55   Dg C-arm 1-60 Min-no Report  09/24/2014   CLINICAL DATA:  Left hip replacement.  EXAM: DG C-ARM 1-60 MIN - [facility]PT [facility]; LEFT HIP - COMPLETE 2+ VIEW  COMPARISON:  09/23/2014.  FINDINGS: Total left hip replacement with good anatomic alignment. Prostheses intact.  IMPRESSION: Total left hip replacement with good anatomic alignment.   Electronically Signed   By: Marcello Moores  Register   On: 09/24/2014 17:07    Scheduled Meds: . aspirin  325 mg Oral Daily  . atorvastatin  20 mg Oral q1800  . clopidogrel  75 mg Oral Daily  . levothyroxine  112 mcg Oral QAC breakfast  . methocarbamol  500 mg Oral TID  . multivitamin with minerals   1 tablet Oral Daily  . ondansetron (ZOFRAN) IV  4 mg Intravenous Once  . senna  1 tablet Oral BID  . senna  1 tablet Oral BID   Continuous Infusions:      Time spent: 25 minutes    Louellen Molder  [facility] Pager [phone] If 7PM-7AM, please contact night-coverage at [email or web address], password [facility] 09/26/2014, 11:06 AM  LOS: 3 days

## 2014-09-27 ENCOUNTER — Encounter (HOSPITAL_COMMUNITY): Payer: Self-pay | Admitting: Orthopaedic Surgery

## 2014-09-27 DIAGNOSIS — S72002D Fracture of unspecified part of neck of left femur, subsequent encounter for closed fracture with routine healing: Secondary | ICD-10-CM

## 2014-09-27 LAB — PREPARE RBC (CROSSMATCH)

## 2014-09-27 LAB — CBC
HCT: 22.3 % — ABNORMAL LOW (ref 36.0–46.0)
Hemoglobin: 7.6 g/dL — ABNORMAL LOW (ref 12.0–15.0)
MCH: 30.6 pg (ref 26.0–34.0)
MCHC: 34.1 g/dL (ref 30.0–36.0)
MCV: 89.9 fL (ref 78.0–100.0)
PLATELETS: 235 10*3/uL (ref 150–400)
RBC: 2.48 MIL/uL — ABNORMAL LOW (ref 3.87–5.11)
RDW: 13.3 % (ref 11.5–15.5)
WBC: 11.2 10*3/uL — ABNORMAL HIGH (ref 4.0–10.5)

## 2014-09-27 MED ORDER — ENSURE COMPLETE PO LIQD
237.0000 mL | Freq: Two times a day (BID) | ORAL | Status: DC
  Administered 2014-09-27 – 2014-09-28 (×4): 237 mL via ORAL

## 2014-09-27 MED ORDER — TUBERCULIN PPD 5 UNIT/0.1ML ID SOLN
5.0000 [IU] | Freq: Once | INTRADERMAL | Status: DC
  Administered 2014-09-27: 5 [IU] via INTRADERMAL
  Filled 2014-09-27: qty 0.1

## 2014-09-27 MED ORDER — HYDROCODONE-ACETAMINOPHEN 5-325 MG PO TABS: 1.0000 | ORAL_TABLET | Freq: Four times a day (QID) | ORAL | Status: DC | PRN

## 2014-09-27 MED ORDER — SODIUM CHLORIDE 0.9 % IV SOLN
Freq: Once | INTRAVENOUS | Status: AC
  Administered 2014-09-27: 10:00:00 via INTRAVENOUS

## 2014-09-27 MED ORDER — ASPIRIN 325 MG PO TABS
325.0000 mg | ORAL_TABLET | Freq: Every day | ORAL | Status: DC
Start: 2014-09-27 — End: 2014-11-08

## 2014-09-27 MED ORDER — DEXTROSE 5 % IV SOLN
1.0000 g | INTRAVENOUS | Status: DC
  Administered 2014-09-27: 1 g via INTRAVENOUS
  Filled 2014-09-27 (×2): qty 10

## 2014-09-27 NOTE — Discharge Instructions (Signed)
Pickup stool softener for constipation. Left lower extremity Weight Bearing as tolerated  No  Left hip precautions Progress activities slowly Expect left hip and possible left knee soreness and swelling. Apply heat or ice as needed. Keep left hip dressing clean dry and intact, may shower with dressing intact. Do not remove dressing.

## 2014-09-27 NOTE — Progress Notes (Addendum)
[facility] PROGRESS NOTE  [name] GNO:[id number] DOB: [birth year] DOA: 09/23/2014 PCP: [name], MD  Brief narrative 80 year old female with parkinsonism and vascular dementia, history of TIAs/stroke, hypothyroidism presented to the ED after sustaining a mechanical fall at home and found to have left femoral neck fracture. admitted to hospitalist service with orthopedic consultation.  Assessment/Plan: Left femoral neck fracture -secondary to mechanical fall -s/p left total hip arthroplasty on 11/6 and tolerated well -pain control with Vicodin when necessary every 6 hours, patient gets drowsy on x-ray dose of Vicodin so would avoid and use Tylenol instead. Avoid morphine or Dilaudid. -poor participation with physical therapy due to severe dementia. -continue Plavix.. Added full dose aspirin for DVT prophylaxis after discussing with orthopedic surgeon (for 4 weeks) -patient will be discharged home tomorrow with home health. Has 24-hour care at home. Family interested in patient being placed on a memory care unit from home eventually. Social work has started the process. -tuberculin test to be placed which will be followed by [facility] at home  Active Problems:  Acute blood loss anemia -drop in hemoglobin to 7.6. Ordered for 2 units PRBC transfusion.   Vascular / Parkinson's dementia Patient seen by neurology recently for shuffling gait and considered this likely to be parkinsonism with severe vascular dementia.Patient also has long history of alcohol abuse. She has 24-hour home care. Currently not on any medications. -Follow-up with neurology as outpatient -added when necessary low-dose Xanax for agitation.  hypothyroidism Continue Synthroid Recent TSH normal  History of TIA/ stroke continue Plavix. lipid panel suggests dyslipidemia. Added Lipitor    Essential hypertension, benign Stable. Not on any medications     Diet: regular  DVT prophylaxis: aspirin 325  mg daily   Code Status: full code Family Communication: spoke with daughter [name] on the phone Disposition Plan: home with home health tomorrow  HPI/Subjective: Patient seen and examined. Worsened confusion due to pain and narcotics  Objective: Filed Vitals:   09/27/14 1340  BP: 134/65  Pulse: 87  Temp: 98.2 F (36.8 C)  Resp: 16    Intake/Output Summary (Last 24 hours) at 09/27/14 1416 Last data filed at 09/27/14 1400  Gross per 24 hour  Intake    940 ml  Output    250 ml  Net    690 ml   Filed Weights   09/27/14 0900  Weight: 53.5 kg (117 lb 15.1 oz)    Exam:   General:  Elderly female in no acute distress, confused  HEENT: Moist oral mucosa  Chest: Clear to auscultation bilaterally  CVS: Normal S1 and S2 3/6 systolic murmur  Abdomen: Soft, nondistended, nontender  Extremities: Dressing over left hip appears clean  CNS: AAOX0, very confued    Data Reviewed: Basic Metabolic Panel:  Recent Labs Lab 09/23/14 1355 09/25/14 0525 09/26/14 0620  NA 132* 133* 135*  K 4.2 4.1 4.0  CL 95* 98 100  CO2 25 24 24   GLUCOSE 100* 153* 137*  BUN 12 9 15   CREATININE 0.54 0.48* 0.49*  CALCIUM 9.1 8.2* 8.3*   Liver Function Tests:  Recent Labs Lab 09/23/14 1355  AST 22  ALT 14  ALKPHOS 88  BILITOT 0.3  PROT 7.2  ALBUMIN 3.6   No results for input(s): LIPASE, AMYLASE in the last 168 hours. No results for input(s): AMMONIA in the last 168 hours. CBC:  Recent Labs Lab 09/23/14 1355 09/25/14 0525 09/26/14 0620 09/27/14 0426  WBC 7.1 10.6* 9.7 11.2*  HGB 12.1 9.0* 7.9* 7.6*  HCT 35.9* 26.2* 23.1* 22.3*  MCV 89.8 87.6 88.5 89.9  PLT 298 271 229 235   Cardiac Enzymes: No results for input(s): CKTOTAL, CKMB, CKMBINDEX, TROPONINI in the last 168 hours. BNP (last 3 results)  Recent Labs  08/18/14 0918  PROBNP 87.0   CBG: No results for input(s): GLUCAP in the last 168 hours.  Recent Results (from the past 240 hour(s))  Surgical pcr screen      Status: None   Collection Time: 09/24/14 10:41 AM  Result Value Ref Range Status   MRSA, PCR NEGATIVE NEGATIVE Final   Staphylococcus aureus NEGATIVE NEGATIVE Final    Comment:        The Xpert SA Assay (FDA approved for NASAL specimens in patients over 85 years of age), is one component of a comprehensive surveillance program.  Test performance has been validated by [employer] for patients greater than or equal to 60 year old. It is not intended to diagnose infection nor to guide or monitor treatment.      Studies: No results found.  Scheduled Meds: . aspirin  325 mg Oral Daily  . atorvastatin  20 mg Oral q1800  . clopidogrel  75 mg Oral Daily  . feeding supplement (ENSURE COMPLETE)  237 mL Oral BID BM  . levothyroxine  112 mcg Oral QAC breakfast  . methocarbamol  500 mg Oral TID  . multivitamin with minerals  1 tablet Oral Daily  . senna  1 tablet Oral BID  . tuberculin  5 Units Intradermal Once   Continuous Infusions:      Time spent: 25 minutes    Ajai Harville  [facility] Pager [phone] If 7PM-7AM, please contact night-coverage at [email or web address], password [facility] 09/27/2014, 2:16 PM  LOS: 4 days

## 2014-09-27 NOTE — Plan of Care (Signed)
Problem: Phase III Progression Outcomes Goal: Incision clean - minimal/no drainage Outcome: Completed/Met Date Met:  09/27/14 Goal: Discharge plan remains appropriate-arrangements made Outcome: Completed/Met Date Met:  09/27/14 Goal: Anticoagulant follow-up in place Outcome: Completed/Met Date Met:  09/27/14

## 2014-09-27 NOTE — Plan of Care (Signed)
Problem: Phase III Progression Outcomes Goal: Ambulates Outcome: Not Met (add Reason) Pt stood but unable to ambulate

## 2014-09-27 NOTE — Progress Notes (Signed)
Pt placed on bedpan; Unable to urinate. Bladder Scan 190. I/O Cath = 200 cc clear yellow Urine.

## 2014-09-27 NOTE — Progress Notes (Signed)
NUTRITION FOLLOW-UP       INTERVENTION:  Provide Ensure Complete po BID, each supplement provides 350 kcal and 13 grams of protein  RD to continue to monitor  NUTRITION DIAGNOSIS: Inadequate oral intake related to omission of energy dense foods as evidenced by pt currently NPO, No longer NPO. Now evidenced by poor PO intake of <25%.  Monitor:  PO and supplemental intake, weight, labs, I/O's  ASSESSMENT: Pt admitted s/p mechanical fall with left displaced femoral neck fracture. Family reported weight loss on admission.   Pt has a history of vascular dementia, EtOH use, requiring home assistance.    PO intake: 10%  Unable to obtain history from pt d/t severe dementia.  Given poor PO, pt would benefit from nutritional supplement. RD to order Ensure supplement BID.   Labs reviewed: Low Na, Creatinine Glucose 137  Height: Ht Readings from Last 1 Encounters:  09/27/14 5\' 2"  (1.575 m)    Weight: Wt Readings from Last 1 Encounters:  09/27/14 117 lb 15.1 oz (53.5 kg)   BMI:  Body mass index is 21.57 kg/(m^2).  Estimated Nutritional Needs: Kcal: 1740-8144 Protein: 70-80g Fluid: >1.5 L/day  Skin: no issues noted  Diet Order: Diet regular  EDUCATION NEEDS: -Education not appropriate at this time   Intake/Output Summary (Last 24 hours) at 09/27/14 1656 Last data filed at 09/27/14 1500  Gross per 24 hour  Intake   1180 ml  Output    250 ml  Net    930 ml    Last BM: 11/5  Labs:   Recent Labs Lab 09/23/14 1355 09/25/14 0525 09/26/14 0620  NA 132* 133* 135*  K 4.2 4.1 4.0  CL 95* 98 100  CO2 25 24 24   BUN 12 9 15   CREATININE 0.54 0.48* 0.49*  CALCIUM 9.1 8.2* 8.3*  GLUCOSE 100* 153* 137*    CBG (last 3)  No results for input(s): GLUCAP in the last 72 hours.  Scheduled Meds: . aspirin  325 mg Oral Daily  . atorvastatin  20 mg Oral q1800  . clopidogrel  75 mg Oral Daily  . feeding supplement (ENSURE COMPLETE)  237 mL Oral BID BM  .  levothyroxine  112 mcg Oral QAC breakfast  . multivitamin with minerals  1 tablet Oral Daily  . senna  1 tablet Oral BID  . tuberculin  5 Units Intradermal Once    Continuous Infusions:    Clayton Bibles, MS, RD, LDN Pager: [phone] After Hours Pager: [phone]

## 2014-09-27 NOTE — Progress Notes (Signed)
Subjective: 3 Days Post-Op Procedure(s) (LRB): TOTAL HIP ARTHROPLASTY ANTERIOR APPROACH, LEFT (Left) Patient reports pain as mild.   Patient awake and pleasant. Objective: Vital signs in last 24 hours: Temp:  [98.7 F (37.1 C)-98.8 F (37.1 C)] 98.8 F (37.1 C) (11/09 0654) Pulse Rate:  [100-103] 103 (11/09 0654) Resp:  [16-18] 16 (11/09 0731) BP: (122-143)/(58-62) 143/58 mmHg (11/09 0654) SpO2:  [93 %-95 %] 93 % (11/09 0654)  Intake/Output from previous day: 11/08 0701 - 11/09 0700 In: 380 [P.O.:380] Out: 425 [Urine:425] Intake/Output this shift:     Recent Labs  09/25/14 0525 09/26/14 0620 09/27/14 0426  HGB 9.0* 7.9* 7.6*    Recent Labs  09/26/14 0620 09/27/14 0426  WBC 9.7 11.2*  RBC 2.61* 2.48*  HCT 23.1* 22.3*  PLT 229 235    Recent Labs  09/25/14 0525 09/26/14 0620  NA 133* 135*  K 4.1 4.0  CL 98 100  CO2 24 24  BUN 9 15  CREATININE 0.48* 0.49*  GLUCOSE 153* 137*  CALCIUM 8.2* 8.3*   No results for input(s): LABPT, INR in the last 72 hours.  Sensation intact distally Intact pulses distally Incision: dressing C/D/I Compartment soft  Assessment/Plan: 3 Days Post-Op Procedure(s) (LRB): TOTAL HIP ARTHROPLASTY ANTERIOR APPROACH, LEFT (Left) Up with therapy  Left hip weight bearing as tolerated. No precautions left hip Acute blood loss anemia secondary to surgery and fracture. On Plavix and Aspirin   CLARK, GILBERT 09/27/2014, 8:07 AM

## 2014-09-27 NOTE — Plan of Care (Signed)
Problem: Phase I Progression Outcomes Goal: Dangle or out of bed evening of surgery Outcome: Completed/Met Date Met:  09/27/14  Problem: Phase II Progression Outcomes Goal: Ambulates Outcome: Not Met (add Reason) Patient unable to ambulate, stood with max assist from staff

## 2014-09-27 NOTE — Progress Notes (Signed)
Physical Therapy Treatment Patient Details Name: [name] MRN: [id number] DOB: Sep 18, 1931 Today's Date: 10-27-14    History of Present Illness s/p L DA THA; PMHx; dementia, CVA    PT Comments    Pt able to verbalize in sentences today but not able to follow functional commands; Progress limited due to pain and cognition, pt to get blood today, Hgb 7.6  Follow Up Recommendations  Supervision/Assistance - 24 hour;Home health PT     Equipment Recommendations  Other (comment) (hoyer left)    Recommendations for Other Services       Precautions / Restrictions Precautions Precautions: Fall Restrictions LLE Weight Bearing: Weight bearing as tolerated    Mobility  Bed Mobility                  Transfers Overall transfer level: Needs assistance Equipment used: Rolling walker (2 wheeled) Transfers: Sit to/from Stand Sit to Stand: +2 physical assistance;Max assist;Mod assist         General transfer comment: multi-modal cues for all aspects of mobility  Ambulation/Gait Ambulation/Gait assistance: +2 physical assistance;Max assist Ambulation Distance (Feet): 6 Feet Assistive device: Rolling walker (2 wheeled)       General Gait Details: multi-modal cues for safety, technique, sequencing   Stairs            Wheelchair Mobility    Modified Rankin (Stroke Patients Only)       Balance             Standing balance-Leahy Scale: Zero                      Cognition Arousal/Alertness: Awake/alert Behavior During Therapy: WFL for tasks assessed/performed Overall Cognitive Status: History of cognitive impairments - at baseline       Memory: Decreased short-term memory              Exercises Total Joint Exercises Ankle Circles/Pumps: Both;10 reps;AAROM    General Comments        Pertinent Vitals/Pain Pain Assessment: Faces Faces Pain Scale: Hurts little more Pain Location: left hip Pain Descriptors / Indicators:  Sore Pain Intervention(s): Monitored during session;Premedicated before session;Limited activity within patient's tolerance    Home Living                      Prior Function            PT Goals (current goals can now be found in the care plan section) Acute Rehab PT Goals Patient Stated Goal: none stated PT Goal Formulation: Patient unable to participate in goal setting Time For Goal Achievement: 10/09/14 Potential to Achieve Goals: Good Progress towards PT goals: Progressing toward goals    Frequency  Min 3X/week    PT Plan Current plan remains appropriate    Co-evaluation             End of Session Equipment Utilized During Treatment: Gait belt Activity Tolerance: Patient limited by pain Patient left: in chair;with call bell/phone within reach;with chair alarm set     Time: 1020-1040 PT Time Calculation (min): 20 min  Charges:  $Therapeutic Activity: 8-22 mins                    G Codes:      [name] 10-27-2014, 1:19 PM

## 2014-09-28 DIAGNOSIS — D62 Acute posthemorrhagic anemia: Secondary | ICD-10-CM | POA: Diagnosis not present

## 2014-09-28 DIAGNOSIS — F0390 Unspecified dementia without behavioral disturbance: Secondary | ICD-10-CM | POA: Diagnosis present

## 2014-09-28 DIAGNOSIS — N3000 Acute cystitis without hematuria: Secondary | ICD-10-CM

## 2014-09-28 DIAGNOSIS — R339 Retention of urine, unspecified: Secondary | ICD-10-CM

## 2014-09-28 DIAGNOSIS — F03C0 Unspecified dementia, severe, without behavioral disturbance, psychotic disturbance, mood disturbance, and anxiety: Secondary | ICD-10-CM | POA: Diagnosis present

## 2014-09-28 DIAGNOSIS — N390 Urinary tract infection, site not specified: Secondary | ICD-10-CM | POA: Diagnosis not present

## 2014-09-28 LAB — TYPE AND SCREEN
ABO/RH(D): O NEG
ANTIBODY SCREEN: NEGATIVE
Unit division: 0
Unit division: 0

## 2014-09-28 LAB — CBC
HCT: 31.4 % — ABNORMAL LOW (ref 36.0–46.0)
HEMOGLOBIN: 10.8 g/dL — AB (ref 12.0–15.0)
MCH: 30.1 pg (ref 26.0–34.0)
MCHC: 34.4 g/dL (ref 30.0–36.0)
MCV: 87.5 fL (ref 78.0–100.0)
Platelets: 306 10*3/uL (ref 150–400)
RBC: 3.59 MIL/uL — ABNORMAL LOW (ref 3.87–5.11)
RDW: 13.6 % (ref 11.5–15.5)
WBC: 8 10*3/uL (ref 4.0–10.5)

## 2014-09-28 MED ORDER — SENNA 8.6 MG PO TABS: 1.0000 | ORAL_TABLET | Freq: Every day | ORAL | Status: DC | PRN

## 2014-09-28 MED ORDER — ATORVASTATIN CALCIUM 20 MG PO TABS: 20.0000 mg | ORAL_TABLET | Freq: Every day | ORAL | Status: DC

## 2014-09-28 MED ORDER — CEFUROXIME AXETIL 250 MG PO TABS: 250.0000 mg | ORAL_TABLET | Freq: Two times a day (BID) | ORAL | Status: DC

## 2014-09-28 MED ORDER — ENSURE COMPLETE PO LIQD: 237.0000 mL | Freq: Two times a day (BID) | ORAL | Status: DC

## 2014-09-28 MED ORDER — ALPRAZOLAM 0.25 MG PO TABS: 0.2500 mg | ORAL_TABLET | Freq: Two times a day (BID) | ORAL | Status: DC | PRN

## 2014-09-28 NOTE — Progress Notes (Signed)
Patient was stable at discharge. [facility] transported patient home. Daughter was notified of time of transport.

## 2014-09-28 NOTE — Progress Notes (Signed)
Physical Therapy Treatment Patient Details Name: [name] MRN: [id number] DOB: 20-Feb-1931 Today's Date: 09/28/2014    History of Present Illness s/p L DA THA; PMHx; dementia, CVA    PT Comments    Pt will benefit from HHPT, plan is for 24hr care; pt is more talkative today and able to bear some wt on LEs; Still requiring +2 for mobility/amb to allow safe forward movement  Follow Up Recommendations  Supervision/Assistance - 24 hour;Home health PT     Equipment Recommendations  Other (comment) (may need Hoyer lift)    Recommendations for Other Services       Precautions / Restrictions Precautions Precautions: Fall Restrictions LLE Weight Bearing: Weight bearing as tolerated    Mobility  Bed Mobility Overal bed mobility: Needs Assistance Bed Mobility: Supine to Sit     Supine to sit: +2 for physical assistance;Max assist     General bed mobility comments: incr time, +2 for trunk and LEs, decr initiation  Transfers Overall transfer level: Needs assistance Equipment used: Rolling walker (2 wheeled) Transfers: Sit to/from Stand Sit to Stand: +2 physical assistance;Max assist;Mod assist         General transfer comment: multi-modal cues for all aspects of mobility  Ambulation/Gait Ambulation/Gait assistance: +2 physical assistance;Max assist;Mod assist Ambulation Distance (Feet): 30 Feet Assistive device: Rolling walker (2 wheeled) Gait Pattern/deviations: Shuffle;Narrow base of support     General Gait Details: multi-modal cues for safety, technique, sequencing   Stairs            Wheelchair Mobility    Modified Rankin (Stroke Patients Only)       Balance                                    Cognition Arousal/Alertness: Awake/alert Behavior During Therapy: WFL for tasks assessed/performed Overall Cognitive Status: History of cognitive impairments - at baseline       Memory: Decreased short-term memory               Exercises Total Joint Exercises Ankle Circles/Pumps: Both;10 reps;AROM Heel Slides: AAROM;10 reps;Left;AROM Hip ABduction/ADduction: AROM;AAROM;Left;10 reps    General Comments        Pertinent Vitals/Pain Pain Assessment: Faces Faces Pain Scale: Hurts a little bit Pain Intervention(s): Limited activity within patient's tolerance;Monitored during session;Repositioned    Home Living                      Prior Function            PT Goals (current goals can now be found in the care plan section) Acute Rehab PT Goals Patient Stated Goal: none stated PT Goal Formulation: Patient unable to participate in goal setting Time For Goal Achievement: 10/09/14 Potential to Achieve Goals: Good Progress towards PT goals: Progressing toward goals    Frequency  Min 3X/week    PT Plan Current plan remains appropriate    Co-evaluation             End of Session Equipment Utilized During Treatment: Gait belt Activity Tolerance: Patient limited by pain Patient left: in chair;with chair alarm set;with family/visitor present     Time: 1209-1227 PT Time Calculation (min) (ACUTE ONLY): 18 min  Charges:  $Gait Training: 8-22 mins                    G Codes:      [name]  09/28/2014, 1:05 PM

## 2014-09-28 NOTE — Plan of Care (Signed)
Problem: Phase III Progression Outcomes Goal: Ambulates Outcome: Not Met (add Reason) Pt transfers does not ambulate distances with nursing staff

## 2014-09-28 NOTE — Progress Notes (Signed)
CSW spoke with pt's daughter, [name],  this am . Family plans to have pt return to [place] following hospital d/c. CSW will assist with [facility] transport home once needed equipment has been delivered. RNCM is assisting with d/c planning. At family's request a FL2 and clinical info has been sent to [facility]. Family is considering ALF/Memory Care placement, eventually, from home. TB screening needed for placement. This was done 11/9 and copy of placement included in d/c packet with FL2 and scripts. [facility] NSG to check TB screening results. CSW has requested [facility] to assist with placement from home. Family has declined SNF placement due to previous poor experience.  Werner Lean LCSW [phone]

## 2014-09-28 NOTE — Plan of Care (Signed)
Problem: Phase III Progression Outcomes Goal: Other Phase III Outcomes/Goals Outcome: Completed/Met Date Met:  09/28/14  Problem: Discharge Progression Outcomes Goal: Tolerates diet Outcome: Completed/Met Date Met:  09/28/14

## 2014-09-28 NOTE — Progress Notes (Signed)
Patient evaluated for [employer] services on behalf of her [employer]. Patient confused at moment. Spoke with patient's nurse. Instructed that patient's daughter [name] should be called. Called Lequita Asal [phone] to discuss [employer] follow up from [place] to assist with placement in the future. Kim agreeable and gave verbal consent. However, writer still left [facility] packet and consents in the room for Kim to sign when she comes to patient's room prior to discharge. Will make inpatient RNCM aware. Marthenia Rolling, MSN- [facility] [phone]

## 2014-09-28 NOTE — Plan of Care (Signed)
Problem: Phase II Progression Outcomes Goal: Ambulates Outcome: Progressing Pt transfers does not ambulate with nursing staff

## 2014-09-28 NOTE — Plan of Care (Signed)
Problem: Phase III Progression Outcomes Goal: Ambulates Outcome: Not Applicable Date Met:  97/94/99

## 2014-09-28 NOTE — Discharge Summary (Signed)
Physician Discharge Summary  [name] ZCH:[id number] DOB: Apr 30, 1931 DOA: 09/23/2014  PCP: [name], MD  Admit date: 09/23/2014 Discharge date: 09/28/2014  Time spent: 35  minutes  Recommendations for Outpatient Follow-up:  1. Discharge home with home health RN, PT, OT, social work and DME 2. Follow-up with Dr. Ninfa Linden is 2 weeks 3. Follow-up with PCP in 1 week. Please follow urine culture results. Foley catheter to be discontinued by [facility] in next few days  Once patient able to participate more with therapy.  Discharge Diagnoses:  Principal Problem:   Left displaced femoral neck fracture   Active Problems:   Vascular dementia   Hypothyroid   Depression   Essential hypertension, benign   Vascular parkinsonism   Postoperative anemia due to acute blood loss   Urinary retention   Infection of urinary tract   Severe dementia   Discharge Condition: fair  Diet recommendation: regular  Filed Weights   09/27/14 0900  Weight: 53.5 kg (117 lb 15.1 oz)    History of present illness:  Please refer to admission H&P for details, but in brief,80 year old female with parkinsonism and vascular dementia, history of TIAs/stroke, hypothyroidism presented to the ED after sustaining a mechanical fall at home and found to have left femoral neck fracture. admitted to hospitalist service with orthopedic consultation  Hospital Course:  Left femoral neck fracture -secondary to mechanical fall -s/p left total hip arthroplasty on 11/6 and tolerated well -pain control with Vicodin when necessary every 6 hours, patient gets drowsy on extra dose of Vicodin showed recommend using Tylenol possibly if pain tolerated.  -poor participation with physical therapy due to severe dementia. -continue Plavix.. DVT prophylaxis with full dose aspirin for 4 weeks as per orthopedic recommendation. -patient will be discharged home with home health. Has 24-hour care at home and family wish her to be  discharged home. Family interested in patient being placed on a memory care unit from home eventually. Social work has started the process. -tuberculin test to be placed which will be followed by [facility] at home. -patient stable for discharge. She will follow-up with Dr. Ninfa Linden in 2 weeks  Active Problems:  Acute blood loss anemia -drop in hemoglobin to 7.6 postop and was given 2 units PRBC with improvement in hemoglobin to > 10.    Vascular / Parkinson's dementia Patient seen by neurology recently for shuffling gait and considered this likely to be parkinsonism with severe vascular dementia.Patient also has long history of alcohol abuse. She has 24-hour home care. Currently not on any medications. -Follow-up with neurology as outpatient -added when necessary low-dose Xanax for agitation.  hypothyroidism Continue Synthroid Recent TSH normal  History of TIA/ stroke continue Plavix. lipid panel suggests dyslipidemia. Added Lipitor  Urinary retention and Pseudomonas UTI Patient having high post void residual urine on bladder scan. As per family she has history of urinary retention and gets straight cath done every other day. A Foley catheter was placed on 11/9 and patient will be discharged on  It until she is able to participate more with physical therapy and a voiding trial can be performed by home health nurse at home. -Patient was given a dose of IV Rocephin and will be discharged on oral Ceftin for another 3 days. Urine culture growing 100K colonies Pseudomonas and sensitivity should be followed up as outpatient. Could not use quinolone or Bactrim given allergies.    Essential hypertension, benign Stable. Not on any medications     Diet: regular  DVT prophylaxis: aspirin  325 mg daily   Code Status: full code Family Communication: spoke with daughter [name] on the phone Disposition Plan: home with home health   Discharge Exam: Danley Danker Vitals:   09/28/14 0442  BP: 140/49   Pulse: 85  Temp: 98.5 F (36.9 C)  Resp: 16     General: Elderly female in no acute distress, confused  HEENT: Moist oral mucosa  Chest: Clear to auscultation bilaterally  CVS: Normal S1 and S2 3/6 systolic murmur  Abdomen: Soft, nondistended, nontender  Extremities: Dressing over left hip appears clean, Foley placed  CNS: AAOX0, very confued  Discharge Instructions You were cared for by a hospitalist during your hospital stay. If you have any questions about your discharge medications or the care you received while you were in the hospital after you are discharged, you can call the unit and asked to speak with the hospitalist on call if the hospitalist that took care of you is not available. Once you are discharged, your primary care physician will handle any further medical issues. Please note that NO REFILLS for any discharge medications will be authorized once you are discharged, as it is imperative that you return to your primary care physician (or establish a relationship with a primary care physician if you do not have one) for your aftercare needs so that they can reassess your need for medications and monitor your lab values.  Discharge Instructions    Discharge wound care:    Complete by:  As directed   Keep dressing clean dry and intact. May shower with dressing intact. Do not remove dressing.     Weight bearing as tolerated    Complete by:  As directed   Laterality:  left  Extremity:  Lower          Current Discharge Medication List    START taking these medications   Details  ALPRAZolam (XANAX) 0.25 MG tablet Take 1 tablet (0.25 mg total) by mouth 2 (two) times daily as needed for anxiety. Qty: 10 tablet, Refills: 0    aspirin 325 MG tablet Take 1 tablet (325 mg total) by mouth daily. Qty: 21 tablet, Refills: 0    atorvastatin (LIPITOR) 20 MG tablet Take 1 tablet (20 mg total) by mouth daily at 6 PM. Qty: 30 tablet, Refills: 0    cefUROXime (CEFTIN) 250  MG tablet Take 1 tablet (250 mg total) by mouth 2 (two) times daily with a meal. Qty: 6 tablet, Refills: 0    feeding supplement, ENSURE COMPLETE, (ENSURE COMPLETE) LIQD Take 237 mLs by mouth 2 (two) times daily between meals. Qty: 60 Bottle, Refills: 0    HYDROcodone-acetaminophen (NORCO/VICODIN) 5-325 MG per tablet Take 1-2 tablets by mouth every 6 (six) hours as needed for moderate pain. Qty: 60 tablet, Refills: 0    senna (SENOKOT) 8.6 MG TABS tablet Take 1 tablet (8.6 mg total) by mouth daily as needed for mild constipation. Qty: 30 each, Refills: 0      CONTINUE these medications which have NOT CHANGED   Details  acetaminophen (TYLENOL) 325 MG tablet Take 325 mg by mouth every 6 (six) hours as needed for mild pain or headache.    clopidogrel (PLAVIX) 75 MG tablet Take 75 mg by mouth daily.     Multiple Vitamin (MULTIVITAMIN WITH MINERALS) TABS Take 1 tablet by mouth daily.    OVER THE COUNTER MEDICATION Take 0.5 tablets by mouth as needed (arthritis pain). OTC arthritis medicine from [facility]    Probiotic Product (ACIDOPHILUS/GOAT  MILK) CAPS Take by mouth.    levothyroxine (SYNTHROID, LEVOTHROID) 112 MCG tablet TAKE 1 TABLET (112 MCG TOTAL) BY MOUTH DAILY. Qty: 30 tablet, Refills: 11      STOP taking these medications     acetaminophen (TYLENOL) 650 MG CR tablet        Allergies  Allergen Reactions  . Citalopram Hydrobromide     Shaky inside  . Sulfa Antibiotics     rash  . Ciprofloxacin Rash   Follow-up Information    Follow up with Mcarthur Rossetti, MD In 2 weeks.   Specialty:  Orthopedic Surgery   Contact information:   [place] [address] [phone]       Follow up with [name], MD. Schedule an appointment as soon as possible for a visit in 1 week.   Specialty:  Family Medicine   Contact information:   [place] [address] [phone]        The results of significant diagnostics  from this hospitalization (including imaging, microbiology, ancillary and laboratory) are listed below for reference.    Significant Diagnostic Studies: Dg Chest 1 View  09/23/2014   CLINICAL DATA:  Pre operative respiratory exam. Acute left hip fracture.  EXAM: CHEST - 1 VIEW  COMPARISON:  02/14/2011  FINDINGS: Heart size and pulmonary vascularity are normal and the lungs are clear. No acute osseous abnormality. Multiple old healed right rib fractures. Fairly severe chronic rotator cuff disease bilaterally.  IMPRESSION: No acute abnormalities.   Electronically Signed   By: Rozetta Nunnery M.D.   On: 09/23/2014 14:36   Dg Hip Complete Left  09/24/2014   CLINICAL DATA:  Left hip replacement.  EXAM: DG C-ARM 1-60 MIN - NRPT [facility]; LEFT HIP - COMPLETE 2+ VIEW  COMPARISON:  09/23/2014.  FINDINGS: Total left hip replacement with good anatomic alignment. Prostheses intact.  IMPRESSION: Total left hip replacement with good anatomic alignment.   Electronically Signed   By: Marcello Moores  Register   On: 09/24/2014 17:07   Dg Hip Complete Left  09/23/2014   CLINICAL DATA:  Left hip pain secondary to a fall in her living room.  EXAM: LEFT HIP - COMPLETE 2+ VIEW  COMPARISON:  None.  FINDINGS: There is an overriding slightly angulated subcapital fracture of the left femoral neck.  No other acute abnormality. Moderately severe arthritis of the right hip with evidence of old right ischial fractures.  IMPRESSION: Fracture of the left femoral neck.   Electronically Signed   By: Rozetta Nunnery M.D.   On: 09/23/2014 14:34   Pelvis Portable  09/24/2014   CLINICAL DATA:  Status post total left hip arthroplasty.  EXAM: PORTABLE PELVIS 1-2 VIEWS  COMPARISON:  Operative images obtained this same date.  FINDINGS: Single AP view shows the left hip femoral and acetabular prosthetic components to be well-seated and aligned. There is no acute fracture or evidence of an operative complication. There are prominent arthropathic changes of the  right hip. Bones are diffusely demineralized.  IMPRESSION: Well aligned left hip prosthesis.   Electronically Signed   By: Lajean Manes M.D.   On: 09/24/2014 18:55   Dg C-arm 1-60 Min-no Report  09/24/2014   CLINICAL DATA:  Left hip replacement.  EXAM: DG C-ARM 1-60 MIN - [facility]PT [facility]; LEFT HIP - COMPLETE 2+ VIEW  COMPARISON:  09/23/2014.  FINDINGS: Total left hip replacement with good anatomic alignment. Prostheses intact.  IMPRESSION: Total left hip replacement with good anatomic alignment.  Electronically Signed   By: Marcello Moores  Register   On: 09/24/2014 17:07    Microbiology: Recent Results (from the past 240 hour(s))  Surgical pcr screen     Status: None   Collection Time: 09/24/14 10:41 AM  Result Value Ref Range Status   MRSA, PCR NEGATIVE NEGATIVE Final   Staphylococcus aureus NEGATIVE NEGATIVE Final    Comment:        The Xpert SA Assay (FDA approved for NASAL specimens in patients over 72 years of age), is one component of a comprehensive surveillance program.  Test performance has been validated by [employer] for patients greater than or equal to 9 year old. It is not intended to diagnose infection nor to guide or monitor treatment.   Culture, Urine     Status: None (Preliminary result)   Collection Time: 09/26/14  6:02 PM  Result Value Ref Range Status   Specimen Description URINE, CATHETERIZED  Final   Special Requests NONE  Final   Culture  Setup Time   Final    09/26/2014 23:09 Performed at [place]   Final    >=100,000 COLONIES/ML Performed at [facility]    Culture   Final    PSEUDOMONAS AERUGINOSA Performed at [facility]    Report Status PENDING  Incomplete     Labs: Basic Metabolic Panel:  Recent Labs Lab 09/23/14 1355 09/25/14 0525 09/26/14 0620  NA 132* 133* 135*  K 4.2 4.1 4.0  CL 95* 98 100  CO2 25 24 24   GLUCOSE 100* 153* 137*  BUN 12 9 15   CREATININE 0.54 0.48* 0.49*  CALCIUM 9.1 8.2*  8.3*   Liver Function Tests:  Recent Labs Lab 09/23/14 1355  AST 22  ALT 14  ALKPHOS 88  BILITOT 0.3  PROT 7.2  ALBUMIN 3.6   No results for input(s): LIPASE, AMYLASE in the last 168 hours. No results for input(s): AMMONIA in the last 168 hours. CBC:  Recent Labs Lab 09/23/14 1355 09/25/14 0525 09/26/14 0620 09/27/14 0426 09/28/14 0434  WBC 7.1 10.6* 9.7 11.2* 8.0  HGB 12.1 9.0* 7.9* 7.6* 10.8*  HCT 35.9* 26.2* 23.1* 22.3* 31.4*  MCV 89.8 87.6 88.5 89.9 87.5  PLT 298 271 229 235 306   Cardiac Enzymes: No results for input(s): CKTOTAL, CKMB, CKMBINDEX, TROPONINI in the last 168 hours. BNP: BNP (last 3 results)  Recent Labs  08/18/14 0918  PROBNP 87.0   CBG: No results for input(s): GLUCAP in the last 168 hours.     SignedLouellen Molder  [facility] 09/28/2014, 11:28 AM

## 2014-09-28 NOTE — Plan of Care (Signed)
Problem: Phase II Progression Outcomes Goal: Ambulates Outcome: Not Met (add Reason)

## 2014-09-28 NOTE — Progress Notes (Signed)
Discharge instructions and prescriptions given to daughter [name] questions answered.

## 2014-09-29 LAB — URINE CULTURE: Colony Count: >=100000

## 2014-10-05 ENCOUNTER — Telehealth: Payer: Self-pay | Admitting: Family Medicine

## 2014-10-05 NOTE — Telephone Encounter (Signed)
Yes

## 2014-10-05 NOTE — Telephone Encounter (Signed)
Pt daughter called to reschedule due to transportation issues. She would like to bring her in Thursday 10/07/14 at 4:15. May I use those last 2 slots

## 2014-10-06 ENCOUNTER — Ambulatory Visit: Payer: Commercial Managed Care - HMO | Admitting: Family Medicine

## 2014-10-06 ENCOUNTER — Other Ambulatory Visit: Payer: Self-pay

## 2014-10-06 DIAGNOSIS — M179 Osteoarthritis of knee, unspecified: Secondary | ICD-10-CM

## 2014-10-06 DIAGNOSIS — M1710 Unilateral primary osteoarthritis, unspecified knee: Secondary | ICD-10-CM

## 2014-10-06 NOTE — Telephone Encounter (Signed)
Pt scheduled  

## 2014-10-07 ENCOUNTER — Telehealth: Payer: Self-pay | Admitting: Family Medicine

## 2014-10-07 ENCOUNTER — Ambulatory Visit (INDEPENDENT_AMBULATORY_CARE_PROVIDER_SITE_OTHER): Payer: Commercial Managed Care - HMO | Admitting: Family Medicine

## 2014-10-07 ENCOUNTER — Encounter: Payer: Self-pay | Admitting: Family Medicine

## 2014-10-07 VITALS — BP 128/76 | HR 76 | Temp 97.3°F

## 2014-10-07 DIAGNOSIS — G4700 Insomnia, unspecified: Secondary | ICD-10-CM

## 2014-10-07 DIAGNOSIS — R339 Retention of urine, unspecified: Secondary | ICD-10-CM

## 2014-10-07 DIAGNOSIS — R21 Rash and other nonspecific skin eruption: Secondary | ICD-10-CM

## 2014-10-07 MED ORDER — TRIAMCINOLONE ACETONIDE 0.1 % EX CREA: 1.0000 "application " | TOPICAL_CREAM | Freq: Two times a day (BID) | CUTANEOUS | Status: DC

## 2014-10-07 MED ORDER — ALPRAZOLAM 0.25 MG PO TABS: 0.2500 mg | ORAL_TABLET | Freq: Every evening | ORAL | Status: DC | PRN

## 2014-10-07 MED ORDER — SERTRALINE HCL 25 MG PO TABS: 25.0000 mg | ORAL_TABLET | Freq: Every day | ORAL | Status: DC

## 2014-10-07 NOTE — Progress Notes (Signed)
Pre visit review using our clinic review tool, if applicable. No additional management support is needed unless otherwise documented below in the visit note. 

## 2014-10-07 NOTE — Telephone Encounter (Signed)
Sofia rn from [facility] is calling   pt has appt today. Threasa Alpha would like to know can they take pt catheter out of next visit please call with verbal order. Pt has rash on face. Pt needs refill on xanax

## 2014-10-07 NOTE — Telephone Encounter (Signed)
Left message for Threasa Alpha to return call

## 2014-10-07 NOTE — Telephone Encounter (Signed)
Recommend foley cath out tomorrow and voiding trial.   If unable to void will need follow up with urologist.

## 2014-10-07 NOTE — Progress Notes (Signed)
Subjective:    Patient ID: [name], female    DOB: July 15, 1931, 80 y.o.   MRN: [id number]  HPI Patient seen for hospital follow-up for recent left hip fracture. She ended up with left hip replacement as she had advanced osteoarthritis. She has chronic problems including Vascular dementia, recurrent UTI, osteoarthritis, hypothyroidism, hyperlipidemia, hypertension, remote history of alcohol abuse.  She currently resides at [facility] and has 24-hour care.  She was discharged with home health RN, PT, and OT. She has a long history of urinary retention and has had in and out catheterizations. She had Foley catheter placed on 11- 9 and remains in at this point. Plan was to leave this in place until she was able to progress with physical therapy. She has not had any fever since discharge. She received IV Rocephin in hospital and was discharged on Ceftin.  She has some agitation at night and insomnia and was prescribed low-dose alprazolam 0.25 mg twice daily. Daughter has noted that she seemed somewhat sedated. She is eating well and ambulating with walker.  She's had erythematous rash on her face around the corners of her mouth and nasal labial fold. Occasional scaliness.  Past Medical History  Diagnosis Date  . Alcohol abuse   . Arthritis   . Depression   . Stroke   . Thyroid disease     hypothyroid   Past Surgical History  Procedure Laterality Date  . Cholecystectomy  2008  . Tonsillectomy and adenoidectomy    . Abdominal hysterectomy      partial  . Thyroid surgery    . Total hip arthroplasty Left 09/24/2014    Procedure: TOTAL HIP ARTHROPLASTY ANTERIOR APPROACH, LEFT;  Surgeon: Mcarthur Rossetti, MD;  Location: [facility];  Service: Orthopedics;  Laterality: Left;    reports that she has never smoked. She has never used smokeless tobacco. She reports that she does not drink alcohol or use illicit drugs. family history includes Arthritis in her mother; Cancer in her  daughter, father, and sister; Diabetes in her brother; Hyperlipidemia in her mother; Hypertension in her brother, mother, and sister; Mental illness in her mother; Stroke in her mother and sister. Allergies  Allergen Reactions  . Citalopram Hydrobromide     Shaky inside  . Sulfa Antibiotics     rash  . Ciprofloxacin Rash      Review of Systems  Constitutional: Negative for fever and chills.  Respiratory: Negative for cough.   Cardiovascular: Negative for chest pain.  Gastrointestinal: Negative for nausea, vomiting and diarrhea.  Psychiatric/Behavioral: Positive for confusion and sleep disturbance. Negative for hallucinations.       Objective:   Physical Exam  Constitutional:  Patient is slightly somnolent but arousable and cooperative. Somewhat frail in appearance  HENT:  Mouth/Throat: Oropharynx is clear and moist.  Neck: Neck supple.  Cardiovascular: Normal rate and regular rhythm.   Pulmonary/Chest: Effort normal and breath sounds normal. No respiratory distress. She has no wheezes. She has no rales.  Musculoskeletal: She exhibits no edema.  Lymphadenopathy:    She has no cervical adenopathy.  Skin: Rash noted.  She has erythematous rash nasal labial folds and corners of mouth and somewhat on the chin. No pustules.          Assessment & Plan:  #1 recent left hip fracture. She had left hip arthroplasty and follow-up with orthopedist as scheduled #2 history of chronic urinary retention. Recent Pseudomonas grown out of urine. She still has Foley catheter in place.  Will recommend removal of Foley catheter tomorrow by home health RN and voiding trial and urology follow-up if she is not able to start voiding again on her own. No fever or other suggestion of active infection. #3 insomnia. This has been somewhat chronic. We have cautioned against overmedication -especially benzodiazepines. Try tapering off. Trial of low-dose sertraline 25 mg daily at bedtime and will only give  0.25 mg Xanax if she has severe agitation at night #4 facial rash. Appears to be more eczematous. Triamcinolone 0.1% cream twice daily as needed

## 2014-10-08 NOTE — Telephone Encounter (Signed)
Left message for Threasa Alpha to return call

## 2014-10-08 NOTE — Telephone Encounter (Signed)
Left message for [name] to return call

## 2014-10-11 NOTE — Telephone Encounter (Signed)
Sofia informed.

## 2014-10-13 ENCOUNTER — Other Ambulatory Visit: Payer: Self-pay

## 2014-10-13 MED ORDER — LEVOFLOXACIN 500 MG PO TABS: 500.0000 mg | ORAL_TABLET | Freq: Every day | ORAL | Status: DC

## 2014-10-13 NOTE — Telephone Encounter (Signed)
Per. B to send in Levaquin 500mg  to [facility] at [facility]. Pt daughter is aware and Rx sent to pharmacy

## 2014-11-04 ENCOUNTER — Encounter: Payer: Self-pay | Admitting: Family Medicine

## 2014-11-05 ENCOUNTER — Telehealth: Payer: Self-pay | Admitting: Family Medicine

## 2014-11-05 MED ORDER — TRIAMCINOLONE ACETONIDE 0.1 % EX CREA: 1.0000 "application " | TOPICAL_CREAM | Freq: Two times a day (BID) | CUTANEOUS | Status: DC

## 2014-11-05 NOTE — Telephone Encounter (Signed)
Can get urine cx.  Main issue with external hemorrhoids is avoid constipation and sitz baths if possible.  We could try triamcinolone 0.1% cream twice daily for one week. Needs to be seen if not improving.

## 2014-11-05 NOTE — Telephone Encounter (Signed)
Sofia called from [facility] and is requesting a rx for hemmeroids and an order for a culture she thinks pt has a UTI.  She said they tried over the counter meds for hemmeroids and they are not working.  She said she will be seeing pt on tomorrow 11/06/14   phone number for [place] ; [phone]

## 2014-11-05 NOTE — Telephone Encounter (Signed)
Rx sent to [facility] left VM on [facility] phone.

## 2014-11-08 ENCOUNTER — Ambulatory Visit (INDEPENDENT_AMBULATORY_CARE_PROVIDER_SITE_OTHER): Payer: Commercial Managed Care - HMO | Admitting: Neurology

## 2014-11-08 ENCOUNTER — Encounter: Payer: Self-pay | Admitting: Neurology

## 2014-11-08 VITALS — BP 110/68 | HR 75 | Wt 113.6 lb

## 2014-11-08 DIAGNOSIS — F0150 Vascular dementia without behavioral disturbance: Secondary | ICD-10-CM

## 2014-11-08 DIAGNOSIS — G214 Vascular parkinsonism: Secondary | ICD-10-CM

## 2014-11-08 MED ORDER — DONEPEZIL HCL 5 MG PO TABS: 5.0000 mg | ORAL_TABLET | Freq: Every day | ORAL | Status: DC

## 2014-11-08 MED ORDER — DONEPEZIL HCL 10 MG PO TABS: 10.0000 mg | ORAL_TABLET | Freq: Every day | ORAL | Status: DC

## 2014-11-08 NOTE — Patient Instructions (Signed)
Start Aricept 5 mg daily for a month, then increase to 10 mg tablets. Both prescriptions were sent to [facility]. Follow up in 3 months.

## 2014-11-08 NOTE — Progress Notes (Signed)
[name] was seen today in the movement disorders clinic for neurologic consultation at the request of Dorothy Post, MD.  The consultation is for the evaluation of shuffling gait and to r/o PD.  Pt has a hx of significant memory change and hx of EtOH abuse.  She is accompanied by her caregiver from [facility] who supplements the hx.  The records that were made available to me were reviewed.  Shuffling is intermittent and has been going on "for a while."    11/08/14 update:  The patient returns today for follow-up, accompanied by her daughter who supplements the history.  The patient has history of vascular dementia and last visit I stressed the need for 24-hour per day care.  Only 5 days after our last visit the patient fell and fractured her left hip and ended up having left hip replacement on 09/24/2014.  Her caregiver was present when she fx the hip.  She does now have 24-hour per day care.  In addition to the dementia, the patient has vascular parkinsonism.  I was very resistant to trying levodopa for multiple reasons, including the fact that only 30% of patients with vascular parkinsonism responds to it, in addition to the fact that she has profound memory change which can be worsened by levodopa and previously was living alone (with some daytime caregiving).  Pts daughter states that there is a lot more behavioral/agitation issues.  It occurs from afternoon (about 2pm) until 4pm and then at night.  Her bedtime is inconsistent, depending on which caregiver is with her.  There are hallucinations.  Her daughter states that they were on memory meds (maybe namenda) at some point but cannot remember why they were stopped but thinks that it was because of frequent bladder infections.  No falls since hip fx but fearful of falling now.     Neuroimaging has  previously been performed.  It is available for my review today.  CT of the brain was performed in May, 2014.  There is evidence of very  significant small vessel disease.  PREVIOUS MEDICATIONS: ? namenda  ALLERGIES:   Allergies  Allergen Reactions  . Citalopram Hydrobromide     Shaky inside  . Sulfa Antibiotics     rash  . Ciprofloxacin Rash    CURRENT MEDICATIONS:  Outpatient Encounter Prescriptions as of 11/08/2014  Medication Sig  . acetaminophen (TYLENOL) 325 MG tablet Take 325 mg by mouth every 6 (six) hours as needed for mild pain or headache.  . ALPRAZolam (XANAX) 0.25 MG tablet Take 1 tablet (0.25 mg total) by mouth at bedtime as needed for anxiety.  . clopidogrel (PLAVIX) 75 MG tablet Take 75 mg by mouth daily.   . feeding supplement, ENSURE COMPLETE, (ENSURE COMPLETE) LIQD Take 237 mLs by mouth 2 (two) times daily between meals.  Marland Kitchen levothyroxine (SYNTHROID, LEVOTHROID) 112 MCG tablet TAKE 1 TABLET (112 MCG TOTAL) BY MOUTH DAILY.  . Multiple Vitamin (MULTIVITAMIN WITH MINERALS) TABS Take 1 tablet by mouth daily.  Marland Kitchen OVER THE COUNTER MEDICATION Take 0.5 tablets by mouth as needed (arthritis pain). OTC arthritis medicine from [facility]  . Probiotic Product (ACIDOPHILUS/GOAT MILK) CAPS Take by mouth.  . senna (SENOKOT) 8.6 MG TABS tablet Take 1 tablet (8.6 mg total) by mouth daily as needed for mild constipation.  . sertraline (ZOLOFT) 25 MG tablet Take 1 tablet (25 mg total) by mouth daily.  Marland Kitchen donepezil (ARICEPT) 10 MG tablet Take 1 tablet (10 mg total) by mouth at  bedtime.  . donepezil (ARICEPT) 5 MG tablet Take 1 tablet (5 mg total) by mouth at bedtime.  . [DISCONTINUED] aspirin 325 MG tablet Take 1 tablet (325 mg total) by mouth daily.  . [DISCONTINUED] cefUROXime (CEFTIN) 250 MG tablet Take 1 tablet (250 mg total) by mouth 2 (two) times daily with a meal.  . [DISCONTINUED] levofloxacin (LEVAQUIN) 500 MG tablet Take 1 tablet (500 mg total) by mouth daily.  . [DISCONTINUED] sertraline (ZOLOFT) 25 MG tablet Take 25 mg by mouth.  . [DISCONTINUED] triamcinolone cream (KENALOG) 0.1 % Apply 1 application topically  2 (two) times daily.    PAST MEDICAL HISTORY:   Past Medical History  Diagnosis Date  . Alcohol abuse   . Arthritis   . Depression   . Stroke   . Thyroid disease     hypothyroid    PAST SURGICAL HISTORY:   Past Surgical History  Procedure Laterality Date  . Cholecystectomy  2008  . Tonsillectomy and adenoidectomy    . Abdominal hysterectomy      partial  . Thyroid surgery    . Total hip arthroplasty Left 09/24/2014    Procedure: TOTAL HIP ARTHROPLASTY ANTERIOR APPROACH, LEFT;  Surgeon: Mcarthur Rossetti, MD;  Location: [facility];  Service: Orthopedics;  Laterality: Left;    SOCIAL HISTORY:   History   Social History  . Marital Status: Widowed    Spouse Name: N/A    Number of Children: N/A  . Years of Education: N/A   Occupational History  . Not on file.   Social History Main Topics  . Smoking status: Never Smoker   . Smokeless tobacco: Never Used  . Alcohol Use: No     Comment: used to drink   . Drug Use: No  . Sexual Activity: Not on file   Other Topics Concern  . Not on file   Social History Narrative    FAMILY HISTORY:   Family Status  Relation Status Death Age  . Mother Deceased     stroke   . Father Deceased     lung cancer  . Sister Deceased     ovarian cancer  . Sister Deceased     stroke  . Sister Alive   . Sister Alive   . Sister Alive   . Brother Alive   . Brother Alive   . Daughter Alive     breast cancer  . Daughter Alive     healthy    ROS:  A complete 10 system review of systems was obtained and was unremarkable apart from what is mentioned above.  PHYSICAL EXAMINATION:    VITALS:   Filed Vitals:   11/08/14 0833  BP: 110/68  Pulse: 75  Weight: 113 lb 9 oz (51.512 kg)  SpO2: 94%    GEN:  The patient appears stated age and is in NAD. HEENT:  Normocephalic, atraumatic.  The mucous membranes are moist. The superficial temporal arteries are without ropiness or tenderness. CV:  RRR Lungs:  CTAB Neck/HEME:  There are  no carotid bruits bilaterally.  Neurological examination:  Orientation: A complete MoCA was performed last visit and the patient scored a 7/30.  She is not oriented to month/date/year. Montreal Cognitive Assessment  09/17/2014  Visuospatial/ Executive (0/5) 0  Naming (0/3) 2  Attention: Read list of digits (0/2) 1  Attention: Read list of letters (0/1) 0  Attention: Serial 7 subtraction starting at 100 (0/3) 0  Language: Repeat phrase (0/2) 0  Language : Fluency (  0/1) 0  Abstraction (0/2) 0  Delayed Recall (0/5) 0  Orientation (0/6) 4  Total 7  Adjusted Score (based on education) 7    Cranial nerves: There is good facial symmetry.  There is facial hypomimia. The speech is fluent and clear. Soft palate rises symmetrically and there is no tongue deviation. Hearing is intact to conversational tone. Sensation: Sensation is intact to light touch throughout. Motor: Strength is 5/5 in the bilateral upper and lower extremities with the exception of shoulder abduction bilaterally and strength there is 3/5.   Shoulder shrug is equal and symmetric.  There is no pronator drift.   Movement examination: Tone: There is so much gegenhalten today that it is difficult to assess true rigidity.   Coordination:  There is no true decremation with rapid alternating movements, but she is slow with virtually all forms of rapid alternating movements and is also somewhat apraxic when performing rapid alternating movements Gait and Station: The patient is in transport chair and just doesn't feel comfortable walking and wasn't assessed today.  ASSESSMENT/PLAN:  1.  Parkinsonism, suspect vascular in nature.  -I. had a long discussion with the patient and her daughter.  I reviewed with her daughter what I reviewed with the caregiver last visit.  Her daughter agreed that she did not want her on levodopa given the concern that it could increase confusion and hallucinations. 2.  profound dementia, likely vascular.   Patient also has a history of alcohol abuse, but is no longer drinking.  -After hip fx and subsequent replacement on 09/24/14, the patient now has 24 hour per day care.  -I talked to her daughter about various treatments.  Ultimately, we decided to just start with Aricept and see how she does.  I did talk to her daughter about atypical antipsychotic medications.  We talked about the black box warning and the serious nature of these medications, including morbidity/mortality in the elderly associated with cardiac/infectious events.  We ultimately decided to first try Aricept and see how she does.  Her daughter will think about Seroquel in the future if needed. 3.  Return in about 3 months (around 02/07/2015).  Much greater than 50% of this visit was spent in counseling with the patient and the family.  Total face to face time:  45 min

## 2014-11-18 ENCOUNTER — Ambulatory Visit: Payer: Commercial Managed Care - HMO | Admitting: Neurology

## 2014-12-01 ENCOUNTER — Encounter: Payer: Self-pay | Admitting: Family Medicine

## 2014-12-03 DIAGNOSIS — F329 Major depressive disorder, single episode, unspecified: Secondary | ICD-10-CM | POA: Diagnosis not present

## 2014-12-03 DIAGNOSIS — R339 Retention of urine, unspecified: Secondary | ICD-10-CM | POA: Diagnosis not present

## 2014-12-03 DIAGNOSIS — I10 Essential (primary) hypertension: Secondary | ICD-10-CM | POA: Diagnosis not present

## 2014-12-03 DIAGNOSIS — G20 Parkinson's disease: Secondary | ICD-10-CM | POA: Diagnosis not present

## 2014-12-06 DIAGNOSIS — N390 Urinary tract infection, site not specified: Secondary | ICD-10-CM | POA: Diagnosis not present

## 2014-12-06 DIAGNOSIS — R339 Retention of urine, unspecified: Secondary | ICD-10-CM | POA: Diagnosis not present

## 2014-12-17 DIAGNOSIS — I10 Essential (primary) hypertension: Secondary | ICD-10-CM | POA: Diagnosis not present

## 2014-12-17 DIAGNOSIS — R339 Retention of urine, unspecified: Secondary | ICD-10-CM | POA: Diagnosis not present

## 2014-12-17 DIAGNOSIS — D649 Anemia, unspecified: Secondary | ICD-10-CM | POA: Diagnosis not present

## 2014-12-17 DIAGNOSIS — G20 Parkinson's disease: Secondary | ICD-10-CM | POA: Diagnosis not present

## 2014-12-17 DIAGNOSIS — G3184 Mild cognitive impairment, so stated: Secondary | ICD-10-CM | POA: Diagnosis not present

## 2014-12-17 DIAGNOSIS — M1990 Unspecified osteoarthritis, unspecified site: Secondary | ICD-10-CM | POA: Diagnosis not present

## 2014-12-17 DIAGNOSIS — I2510 Atherosclerotic heart disease of native coronary artery without angina pectoris: Secondary | ICD-10-CM | POA: Diagnosis not present

## 2014-12-17 DIAGNOSIS — Z96642 Presence of left artificial hip joint: Secondary | ICD-10-CM | POA: Diagnosis not present

## 2014-12-17 DIAGNOSIS — F329 Major depressive disorder, single episode, unspecified: Secondary | ICD-10-CM | POA: Diagnosis not present

## 2014-12-20 DIAGNOSIS — R339 Retention of urine, unspecified: Secondary | ICD-10-CM | POA: Diagnosis not present

## 2014-12-20 DIAGNOSIS — F329 Major depressive disorder, single episode, unspecified: Secondary | ICD-10-CM | POA: Diagnosis not present

## 2014-12-20 DIAGNOSIS — Z96642 Presence of left artificial hip joint: Secondary | ICD-10-CM | POA: Diagnosis not present

## 2014-12-20 DIAGNOSIS — D649 Anemia, unspecified: Secondary | ICD-10-CM | POA: Diagnosis not present

## 2014-12-20 DIAGNOSIS — M1990 Unspecified osteoarthritis, unspecified site: Secondary | ICD-10-CM | POA: Diagnosis not present

## 2014-12-20 DIAGNOSIS — G20 Parkinson's disease: Secondary | ICD-10-CM | POA: Diagnosis not present

## 2014-12-20 DIAGNOSIS — I10 Essential (primary) hypertension: Secondary | ICD-10-CM | POA: Diagnosis not present

## 2014-12-20 DIAGNOSIS — G3184 Mild cognitive impairment, so stated: Secondary | ICD-10-CM | POA: Diagnosis not present

## 2014-12-20 DIAGNOSIS — I2510 Atherosclerotic heart disease of native coronary artery without angina pectoris: Secondary | ICD-10-CM | POA: Diagnosis not present

## 2014-12-21 DIAGNOSIS — G3184 Mild cognitive impairment, so stated: Secondary | ICD-10-CM | POA: Diagnosis not present

## 2014-12-21 DIAGNOSIS — I2510 Atherosclerotic heart disease of native coronary artery without angina pectoris: Secondary | ICD-10-CM | POA: Diagnosis not present

## 2014-12-21 DIAGNOSIS — D649 Anemia, unspecified: Secondary | ICD-10-CM | POA: Diagnosis not present

## 2014-12-21 DIAGNOSIS — Z96642 Presence of left artificial hip joint: Secondary | ICD-10-CM | POA: Diagnosis not present

## 2014-12-21 DIAGNOSIS — R339 Retention of urine, unspecified: Secondary | ICD-10-CM | POA: Diagnosis not present

## 2014-12-21 DIAGNOSIS — I10 Essential (primary) hypertension: Secondary | ICD-10-CM | POA: Diagnosis not present

## 2014-12-21 DIAGNOSIS — F329 Major depressive disorder, single episode, unspecified: Secondary | ICD-10-CM | POA: Diagnosis not present

## 2014-12-21 DIAGNOSIS — M1990 Unspecified osteoarthritis, unspecified site: Secondary | ICD-10-CM | POA: Diagnosis not present

## 2014-12-21 DIAGNOSIS — G20 Parkinson's disease: Secondary | ICD-10-CM | POA: Diagnosis not present

## 2014-12-22 DIAGNOSIS — Z96642 Presence of left artificial hip joint: Secondary | ICD-10-CM | POA: Diagnosis not present

## 2014-12-22 DIAGNOSIS — F329 Major depressive disorder, single episode, unspecified: Secondary | ICD-10-CM | POA: Diagnosis not present

## 2014-12-22 DIAGNOSIS — D649 Anemia, unspecified: Secondary | ICD-10-CM | POA: Diagnosis not present

## 2014-12-22 DIAGNOSIS — I2510 Atherosclerotic heart disease of native coronary artery without angina pectoris: Secondary | ICD-10-CM | POA: Diagnosis not present

## 2014-12-22 DIAGNOSIS — G20 Parkinson's disease: Secondary | ICD-10-CM | POA: Diagnosis not present

## 2014-12-22 DIAGNOSIS — I10 Essential (primary) hypertension: Secondary | ICD-10-CM | POA: Diagnosis not present

## 2014-12-22 DIAGNOSIS — G3184 Mild cognitive impairment, so stated: Secondary | ICD-10-CM | POA: Diagnosis not present

## 2014-12-22 DIAGNOSIS — R339 Retention of urine, unspecified: Secondary | ICD-10-CM | POA: Diagnosis not present

## 2014-12-22 DIAGNOSIS — M1990 Unspecified osteoarthritis, unspecified site: Secondary | ICD-10-CM | POA: Diagnosis not present

## 2014-12-25 DIAGNOSIS — R339 Retention of urine, unspecified: Secondary | ICD-10-CM | POA: Diagnosis not present

## 2014-12-25 DIAGNOSIS — F329 Major depressive disorder, single episode, unspecified: Secondary | ICD-10-CM | POA: Diagnosis not present

## 2014-12-25 DIAGNOSIS — G3184 Mild cognitive impairment, so stated: Secondary | ICD-10-CM | POA: Diagnosis not present

## 2014-12-25 DIAGNOSIS — M1990 Unspecified osteoarthritis, unspecified site: Secondary | ICD-10-CM | POA: Diagnosis not present

## 2014-12-25 DIAGNOSIS — G20 Parkinson's disease: Secondary | ICD-10-CM | POA: Diagnosis not present

## 2014-12-25 DIAGNOSIS — I2510 Atherosclerotic heart disease of native coronary artery without angina pectoris: Secondary | ICD-10-CM | POA: Diagnosis not present

## 2014-12-25 DIAGNOSIS — I10 Essential (primary) hypertension: Secondary | ICD-10-CM | POA: Diagnosis not present

## 2014-12-25 DIAGNOSIS — Z96642 Presence of left artificial hip joint: Secondary | ICD-10-CM | POA: Diagnosis not present

## 2014-12-25 DIAGNOSIS — D649 Anemia, unspecified: Secondary | ICD-10-CM | POA: Diagnosis not present

## 2014-12-27 DIAGNOSIS — I2510 Atherosclerotic heart disease of native coronary artery without angina pectoris: Secondary | ICD-10-CM | POA: Diagnosis not present

## 2014-12-27 DIAGNOSIS — M1990 Unspecified osteoarthritis, unspecified site: Secondary | ICD-10-CM | POA: Diagnosis not present

## 2014-12-27 DIAGNOSIS — G20 Parkinson's disease: Secondary | ICD-10-CM | POA: Diagnosis not present

## 2014-12-27 DIAGNOSIS — Z96642 Presence of left artificial hip joint: Secondary | ICD-10-CM | POA: Diagnosis not present

## 2014-12-27 DIAGNOSIS — D649 Anemia, unspecified: Secondary | ICD-10-CM | POA: Diagnosis not present

## 2014-12-27 DIAGNOSIS — R339 Retention of urine, unspecified: Secondary | ICD-10-CM | POA: Diagnosis not present

## 2014-12-27 DIAGNOSIS — F329 Major depressive disorder, single episode, unspecified: Secondary | ICD-10-CM | POA: Diagnosis not present

## 2014-12-27 DIAGNOSIS — I10 Essential (primary) hypertension: Secondary | ICD-10-CM | POA: Diagnosis not present

## 2014-12-27 DIAGNOSIS — G3184 Mild cognitive impairment, so stated: Secondary | ICD-10-CM | POA: Diagnosis not present

## 2014-12-29 DIAGNOSIS — Z96642 Presence of left artificial hip joint: Secondary | ICD-10-CM | POA: Diagnosis not present

## 2014-12-29 DIAGNOSIS — R339 Retention of urine, unspecified: Secondary | ICD-10-CM | POA: Diagnosis not present

## 2014-12-29 DIAGNOSIS — M1990 Unspecified osteoarthritis, unspecified site: Secondary | ICD-10-CM | POA: Diagnosis not present

## 2014-12-29 DIAGNOSIS — F329 Major depressive disorder, single episode, unspecified: Secondary | ICD-10-CM | POA: Diagnosis not present

## 2014-12-29 DIAGNOSIS — G3184 Mild cognitive impairment, so stated: Secondary | ICD-10-CM | POA: Diagnosis not present

## 2014-12-29 DIAGNOSIS — S129XXA Fracture of neck, unspecified, initial encounter: Secondary | ICD-10-CM | POA: Diagnosis not present

## 2014-12-29 DIAGNOSIS — D649 Anemia, unspecified: Secondary | ICD-10-CM | POA: Diagnosis not present

## 2014-12-29 DIAGNOSIS — I10 Essential (primary) hypertension: Secondary | ICD-10-CM | POA: Diagnosis not present

## 2014-12-29 DIAGNOSIS — I2510 Atherosclerotic heart disease of native coronary artery without angina pectoris: Secondary | ICD-10-CM | POA: Diagnosis not present

## 2014-12-29 DIAGNOSIS — G20 Parkinson's disease: Secondary | ICD-10-CM | POA: Diagnosis not present

## 2014-12-30 DIAGNOSIS — F329 Major depressive disorder, single episode, unspecified: Secondary | ICD-10-CM | POA: Diagnosis not present

## 2014-12-30 DIAGNOSIS — M1990 Unspecified osteoarthritis, unspecified site: Secondary | ICD-10-CM | POA: Diagnosis not present

## 2014-12-30 DIAGNOSIS — D649 Anemia, unspecified: Secondary | ICD-10-CM | POA: Diagnosis not present

## 2014-12-30 DIAGNOSIS — I2510 Atherosclerotic heart disease of native coronary artery without angina pectoris: Secondary | ICD-10-CM | POA: Diagnosis not present

## 2014-12-30 DIAGNOSIS — G20 Parkinson's disease: Secondary | ICD-10-CM | POA: Diagnosis not present

## 2014-12-30 DIAGNOSIS — R339 Retention of urine, unspecified: Secondary | ICD-10-CM | POA: Diagnosis not present

## 2014-12-30 DIAGNOSIS — G3184 Mild cognitive impairment, so stated: Secondary | ICD-10-CM | POA: Diagnosis not present

## 2014-12-30 DIAGNOSIS — I10 Essential (primary) hypertension: Secondary | ICD-10-CM | POA: Diagnosis not present

## 2014-12-30 DIAGNOSIS — Z96642 Presence of left artificial hip joint: Secondary | ICD-10-CM | POA: Diagnosis not present

## 2015-01-05 ENCOUNTER — Encounter: Payer: Self-pay | Admitting: Family Medicine

## 2015-01-05 ENCOUNTER — Ambulatory Visit (INDEPENDENT_AMBULATORY_CARE_PROVIDER_SITE_OTHER): Payer: Commercial Managed Care - HMO | Admitting: Family Medicine

## 2015-01-05 DIAGNOSIS — D649 Anemia, unspecified: Secondary | ICD-10-CM | POA: Diagnosis not present

## 2015-01-05 DIAGNOSIS — M1990 Unspecified osteoarthritis, unspecified site: Secondary | ICD-10-CM | POA: Diagnosis not present

## 2015-01-05 DIAGNOSIS — Z96642 Presence of left artificial hip joint: Secondary | ICD-10-CM | POA: Diagnosis not present

## 2015-01-05 DIAGNOSIS — I10 Essential (primary) hypertension: Secondary | ICD-10-CM | POA: Diagnosis not present

## 2015-01-05 DIAGNOSIS — I2510 Atherosclerotic heart disease of native coronary artery without angina pectoris: Secondary | ICD-10-CM | POA: Diagnosis not present

## 2015-01-05 DIAGNOSIS — R339 Retention of urine, unspecified: Secondary | ICD-10-CM | POA: Diagnosis not present

## 2015-01-05 DIAGNOSIS — R300 Dysuria: Secondary | ICD-10-CM | POA: Diagnosis not present

## 2015-01-05 DIAGNOSIS — F329 Major depressive disorder, single episode, unspecified: Secondary | ICD-10-CM | POA: Diagnosis not present

## 2015-01-05 DIAGNOSIS — G3184 Mild cognitive impairment, so stated: Secondary | ICD-10-CM | POA: Diagnosis not present

## 2015-01-05 DIAGNOSIS — G20 Parkinson's disease: Secondary | ICD-10-CM | POA: Diagnosis not present

## 2015-01-05 LAB — POCT URINALYSIS DIPSTICK
GLUCOSE UA: NEGATIVE
Nitrite, UA: NEGATIVE
Spec Grav, UA: 1.02
Urobilinogen, UA: 0.2
pH, UA: 8.5

## 2015-01-05 MED ORDER — CEPHALEXIN 500 MG PO CAPS: 500.0000 mg | ORAL_CAPSULE | Freq: Three times a day (TID) | ORAL | Status: DC

## 2015-01-05 NOTE — Progress Notes (Signed)
Pre visit review using our clinic review tool, if applicable. No additional management support is needed unless otherwise documented below in the visit note. 

## 2015-01-05 NOTE — Patient Instructions (Signed)

## 2015-01-05 NOTE — Progress Notes (Signed)
   Subjective:    Patient ID: [name], female    DOB: 1930/12/28, 81 y.o.   MRN: [id number]  HPI Patient has advanced dementia. She is here caregiver with 2 day history of reported burning with urination. History is not very reliable. She has had a history of recurrent UTIs in the past. She's had Pseudomonas couple occasions and unfortunately cannot take Cipro. She is also allergic to sulfa. She's not had any recent nausea or vomiting. No fever. She has some chronic confusion which is unchanged. Family reports that she was on [place] per urology until couple weeks ago.  Past Medical History  Diagnosis Date  . Alcohol abuse   . Arthritis   . Depression   . Stroke   . Thyroid disease     hypothyroid   Past Surgical History  Procedure Laterality Date  . Cholecystectomy  2008  . Tonsillectomy and adenoidectomy    . Abdominal hysterectomy      partial  . Thyroid surgery    . Total hip arthroplasty Left 09/24/2014    Procedure: TOTAL HIP ARTHROPLASTY ANTERIOR APPROACH, LEFT;  Surgeon: Mcarthur Rossetti, MD;  Location: [facility];  Service: Orthopedics;  Laterality: Left;    reports that she has never smoked. She has never used smokeless tobacco. She reports that she does not drink alcohol or use illicit drugs. family history includes Arthritis in her mother; Cancer in her daughter, father, and sister; Diabetes in her brother; Hyperlipidemia in her mother; Hypertension in her brother, mother, and sister; Mental illness in her mother; Stroke in her mother and sister. Allergies  Allergen Reactions  . Citalopram Hydrobromide     Shaky inside  . Sulfa Antibiotics     rash  . Ciprofloxacin Rash      Review of Systems  Constitutional: Negative for fever and chills.  Gastrointestinal: Negative for nausea and vomiting.  Genitourinary: Positive for dysuria and frequency.       Objective:   Physical Exam  Constitutional: She appears well-developed and well-nourished.    Cardiovascular: Normal rate.   Pulmonary/Chest: Effort normal and breath sounds normal. No respiratory distress. She has no wheezes. She has no rales.  Neurological: She is alert.          Assessment & Plan:  Dysuria. Rule out UTI. Urine culture sent. Keflex 500 mg 3 times a day for 7 days pending culture results. Her situation is complicated by the fact that she's had multiple UTIs in the past with pathogens such as Pseudomonas. She is allergic to sulfa and Cipro. Depending on culture she may need injectable therapy

## 2015-01-07 DIAGNOSIS — F329 Major depressive disorder, single episode, unspecified: Secondary | ICD-10-CM | POA: Diagnosis not present

## 2015-01-07 DIAGNOSIS — G20 Parkinson's disease: Secondary | ICD-10-CM | POA: Diagnosis not present

## 2015-01-07 DIAGNOSIS — G3184 Mild cognitive impairment, so stated: Secondary | ICD-10-CM | POA: Diagnosis not present

## 2015-01-07 DIAGNOSIS — I10 Essential (primary) hypertension: Secondary | ICD-10-CM | POA: Diagnosis not present

## 2015-01-07 DIAGNOSIS — M1990 Unspecified osteoarthritis, unspecified site: Secondary | ICD-10-CM | POA: Diagnosis not present

## 2015-01-07 DIAGNOSIS — Z96642 Presence of left artificial hip joint: Secondary | ICD-10-CM | POA: Diagnosis not present

## 2015-01-07 DIAGNOSIS — R339 Retention of urine, unspecified: Secondary | ICD-10-CM | POA: Diagnosis not present

## 2015-01-07 DIAGNOSIS — D649 Anemia, unspecified: Secondary | ICD-10-CM | POA: Diagnosis not present

## 2015-01-07 DIAGNOSIS — I2510 Atherosclerotic heart disease of native coronary artery without angina pectoris: Secondary | ICD-10-CM | POA: Diagnosis not present

## 2015-01-07 LAB — URINE CULTURE

## 2015-01-10 ENCOUNTER — Other Ambulatory Visit: Payer: Self-pay

## 2015-01-10 ENCOUNTER — Telehealth: Payer: Self-pay | Admitting: Family Medicine

## 2015-01-10 DIAGNOSIS — Z96642 Presence of left artificial hip joint: Secondary | ICD-10-CM | POA: Diagnosis not present

## 2015-01-10 DIAGNOSIS — I10 Essential (primary) hypertension: Secondary | ICD-10-CM | POA: Diagnosis not present

## 2015-01-10 DIAGNOSIS — R339 Retention of urine, unspecified: Secondary | ICD-10-CM | POA: Diagnosis not present

## 2015-01-10 DIAGNOSIS — G20 Parkinson's disease: Secondary | ICD-10-CM | POA: Diagnosis not present

## 2015-01-10 DIAGNOSIS — F329 Major depressive disorder, single episode, unspecified: Secondary | ICD-10-CM | POA: Diagnosis not present

## 2015-01-10 DIAGNOSIS — M1990 Unspecified osteoarthritis, unspecified site: Secondary | ICD-10-CM | POA: Diagnosis not present

## 2015-01-10 DIAGNOSIS — I2510 Atherosclerotic heart disease of native coronary artery without angina pectoris: Secondary | ICD-10-CM | POA: Diagnosis not present

## 2015-01-10 DIAGNOSIS — G3184 Mild cognitive impairment, so stated: Secondary | ICD-10-CM | POA: Diagnosis not present

## 2015-01-10 DIAGNOSIS — D649 Anemia, unspecified: Secondary | ICD-10-CM | POA: Diagnosis not present

## 2015-01-10 NOTE — Telephone Encounter (Signed)
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  Take Xanax off her med list and make sure this is not on med list at her residence-[facility].  Why do they need in and out cath order?

## 2015-01-10 NOTE — Telephone Encounter (Signed)
Daughter calling to fu on UA that was done on pt last week.  The antibiotic she was started on is doing, but she does not seem she is getting completely over this. Would like to know if results are back.  Also, pt is moving to [facility] this sat.  [facility] will now be taking over since they go [place] [facility], and pt will need an order for an in and out cath.  Daughter does not want a lapse in this. pls advise

## 2015-01-10 NOTE — Telephone Encounter (Signed)
Ask patient daughter about patient medications and everything is updated on patient med list. Pt needs a order for a in and out cath.  Pt does not take Xanax.

## 2015-01-10 NOTE — Telephone Encounter (Signed)
Left message on pt Daughter Vm

## 2015-01-12 DIAGNOSIS — I2510 Atherosclerotic heart disease of native coronary artery without angina pectoris: Secondary | ICD-10-CM | POA: Diagnosis not present

## 2015-01-12 DIAGNOSIS — R339 Retention of urine, unspecified: Secondary | ICD-10-CM | POA: Diagnosis not present

## 2015-01-12 DIAGNOSIS — M1990 Unspecified osteoarthritis, unspecified site: Secondary | ICD-10-CM | POA: Diagnosis not present

## 2015-01-12 DIAGNOSIS — I10 Essential (primary) hypertension: Secondary | ICD-10-CM | POA: Diagnosis not present

## 2015-01-12 DIAGNOSIS — D649 Anemia, unspecified: Secondary | ICD-10-CM | POA: Diagnosis not present

## 2015-01-12 DIAGNOSIS — G3184 Mild cognitive impairment, so stated: Secondary | ICD-10-CM | POA: Diagnosis not present

## 2015-01-12 DIAGNOSIS — G20 Parkinson's disease: Secondary | ICD-10-CM | POA: Diagnosis not present

## 2015-01-12 DIAGNOSIS — F329 Major depressive disorder, single episode, unspecified: Secondary | ICD-10-CM | POA: Diagnosis not present

## 2015-01-12 DIAGNOSIS — Z96642 Presence of left artificial hip joint: Secondary | ICD-10-CM | POA: Diagnosis not present

## 2015-01-14 DIAGNOSIS — G20 Parkinson's disease: Secondary | ICD-10-CM | POA: Diagnosis not present

## 2015-01-14 DIAGNOSIS — I10 Essential (primary) hypertension: Secondary | ICD-10-CM | POA: Diagnosis not present

## 2015-01-14 DIAGNOSIS — G3184 Mild cognitive impairment, so stated: Secondary | ICD-10-CM | POA: Diagnosis not present

## 2015-01-14 DIAGNOSIS — I2510 Atherosclerotic heart disease of native coronary artery without angina pectoris: Secondary | ICD-10-CM | POA: Diagnosis not present

## 2015-01-14 DIAGNOSIS — R339 Retention of urine, unspecified: Secondary | ICD-10-CM | POA: Diagnosis not present

## 2015-01-14 DIAGNOSIS — D649 Anemia, unspecified: Secondary | ICD-10-CM | POA: Diagnosis not present

## 2015-01-14 DIAGNOSIS — Z96642 Presence of left artificial hip joint: Secondary | ICD-10-CM | POA: Diagnosis not present

## 2015-01-14 DIAGNOSIS — F329 Major depressive disorder, single episode, unspecified: Secondary | ICD-10-CM | POA: Diagnosis not present

## 2015-01-14 DIAGNOSIS — M1990 Unspecified osteoarthritis, unspecified site: Secondary | ICD-10-CM | POA: Diagnosis not present

## 2015-01-17 ENCOUNTER — Telehealth: Payer: Self-pay | Admitting: Family Medicine

## 2015-01-17 NOTE — Telephone Encounter (Signed)
Spoke with pt daughter about Physical Therapy. Daughter will have nursing home send form over to office.

## 2015-01-17 NOTE — Telephone Encounter (Signed)
Daughter has some questions about paperwork that was filled out .

## 2015-01-18 ENCOUNTER — Telehealth: Payer: Self-pay | Admitting: Family Medicine

## 2015-01-18 DIAGNOSIS — F339 Major depressive disorder, recurrent, unspecified: Secondary | ICD-10-CM | POA: Diagnosis not present

## 2015-01-18 DIAGNOSIS — I10 Essential (primary) hypertension: Secondary | ICD-10-CM | POA: Diagnosis not present

## 2015-01-18 DIAGNOSIS — R339 Retention of urine, unspecified: Secondary | ICD-10-CM | POA: Diagnosis not present

## 2015-01-18 DIAGNOSIS — I509 Heart failure, unspecified: Secondary | ICD-10-CM | POA: Diagnosis not present

## 2015-01-18 DIAGNOSIS — Z466 Encounter for fitting and adjustment of urinary device: Secondary | ICD-10-CM | POA: Diagnosis not present

## 2015-01-18 NOTE — Telephone Encounter (Signed)
[facility] needs order for physical therapy, occupational therapy and speech therapy. Also would like an order for a ua.  Did in and out and urine had odor and cloudy.  Last cath was 10 days/ago. (They are requesting in and out  3 x week.) Cecille Rubin aware dr Elease Hashimoto is out this afternoon and will look for this first thing in the am.

## 2015-01-19 DIAGNOSIS — F339 Major depressive disorder, recurrent, unspecified: Secondary | ICD-10-CM | POA: Diagnosis not present

## 2015-01-19 DIAGNOSIS — Z466 Encounter for fitting and adjustment of urinary device: Secondary | ICD-10-CM | POA: Diagnosis not present

## 2015-01-19 DIAGNOSIS — I509 Heart failure, unspecified: Secondary | ICD-10-CM | POA: Diagnosis not present

## 2015-01-19 DIAGNOSIS — I10 Essential (primary) hypertension: Secondary | ICD-10-CM | POA: Diagnosis not present

## 2015-01-19 DIAGNOSIS — R339 Retention of urine, unspecified: Secondary | ICD-10-CM | POA: Diagnosis not present

## 2015-01-19 NOTE — Telephone Encounter (Signed)
Spoke with Cecille Rubin. Informed Cecille Rubin that Therapy are faxed to nursing home. In and out caths orders will be done through patient urologist.

## 2015-01-19 NOTE — Telephone Encounter (Signed)
OK to set up therapy requested.  Why are they requesting I and O caths 3x per week?

## 2015-01-20 DIAGNOSIS — R339 Retention of urine, unspecified: Secondary | ICD-10-CM | POA: Diagnosis not present

## 2015-01-20 DIAGNOSIS — Z466 Encounter for fitting and adjustment of urinary device: Secondary | ICD-10-CM | POA: Diagnosis not present

## 2015-01-20 DIAGNOSIS — F339 Major depressive disorder, recurrent, unspecified: Secondary | ICD-10-CM | POA: Diagnosis not present

## 2015-01-20 DIAGNOSIS — I509 Heart failure, unspecified: Secondary | ICD-10-CM | POA: Diagnosis not present

## 2015-01-20 DIAGNOSIS — I10 Essential (primary) hypertension: Secondary | ICD-10-CM | POA: Diagnosis not present

## 2015-01-21 DIAGNOSIS — R339 Retention of urine, unspecified: Secondary | ICD-10-CM | POA: Diagnosis not present

## 2015-01-21 DIAGNOSIS — I509 Heart failure, unspecified: Secondary | ICD-10-CM | POA: Diagnosis not present

## 2015-01-21 DIAGNOSIS — Z466 Encounter for fitting and adjustment of urinary device: Secondary | ICD-10-CM | POA: Diagnosis not present

## 2015-01-21 DIAGNOSIS — F339 Major depressive disorder, recurrent, unspecified: Secondary | ICD-10-CM | POA: Diagnosis not present

## 2015-01-21 DIAGNOSIS — I10 Essential (primary) hypertension: Secondary | ICD-10-CM | POA: Diagnosis not present

## 2015-01-25 ENCOUNTER — Telehealth: Payer: Self-pay | Admitting: Family Medicine

## 2015-01-25 DIAGNOSIS — I10 Essential (primary) hypertension: Secondary | ICD-10-CM | POA: Diagnosis not present

## 2015-01-25 DIAGNOSIS — F339 Major depressive disorder, recurrent, unspecified: Secondary | ICD-10-CM | POA: Diagnosis not present

## 2015-01-25 DIAGNOSIS — I509 Heart failure, unspecified: Secondary | ICD-10-CM | POA: Diagnosis not present

## 2015-01-25 DIAGNOSIS — R339 Retention of urine, unspecified: Secondary | ICD-10-CM | POA: Diagnosis not present

## 2015-01-25 DIAGNOSIS — Z466 Encounter for fitting and adjustment of urinary device: Secondary | ICD-10-CM | POA: Diagnosis not present

## 2015-01-25 NOTE — Telephone Encounter (Signed)
Caprice Red from [facility] called to say  OT  evaluation  completed 01/19/15. 1 time a week for 3 weeks for training  and transfer and home exercises. No call necessary unless the doctor disagree

## 2015-01-25 NOTE — Telephone Encounter (Signed)
OK 

## 2015-01-27 DIAGNOSIS — F339 Major depressive disorder, recurrent, unspecified: Secondary | ICD-10-CM | POA: Diagnosis not present

## 2015-01-27 DIAGNOSIS — I10 Essential (primary) hypertension: Secondary | ICD-10-CM | POA: Diagnosis not present

## 2015-01-27 DIAGNOSIS — Z466 Encounter for fitting and adjustment of urinary device: Secondary | ICD-10-CM | POA: Diagnosis not present

## 2015-01-27 DIAGNOSIS — I509 Heart failure, unspecified: Secondary | ICD-10-CM | POA: Diagnosis not present

## 2015-01-27 DIAGNOSIS — R339 Retention of urine, unspecified: Secondary | ICD-10-CM | POA: Diagnosis not present

## 2015-01-27 DIAGNOSIS — S129XXA Fracture of neck, unspecified, initial encounter: Secondary | ICD-10-CM | POA: Diagnosis not present

## 2015-01-28 DIAGNOSIS — Z466 Encounter for fitting and adjustment of urinary device: Secondary | ICD-10-CM | POA: Diagnosis not present

## 2015-01-28 DIAGNOSIS — I509 Heart failure, unspecified: Secondary | ICD-10-CM | POA: Diagnosis not present

## 2015-01-28 DIAGNOSIS — I10 Essential (primary) hypertension: Secondary | ICD-10-CM | POA: Diagnosis not present

## 2015-01-28 DIAGNOSIS — R339 Retention of urine, unspecified: Secondary | ICD-10-CM | POA: Diagnosis not present

## 2015-01-28 DIAGNOSIS — F339 Major depressive disorder, recurrent, unspecified: Secondary | ICD-10-CM | POA: Diagnosis not present

## 2015-01-29 ENCOUNTER — Emergency Department (HOSPITAL_COMMUNITY): Payer: Commercial Managed Care - HMO

## 2015-01-29 ENCOUNTER — Emergency Department (HOSPITAL_COMMUNITY)
Admission: EM | Admit: 2015-01-29 | Discharge: 2015-01-29 | Disposition: A | Discharge status: Home / Self Care | Payer: Commercial Managed Care - HMO | Attending: Emergency Medicine | Admitting: Emergency Medicine

## 2015-01-29 ENCOUNTER — Encounter (HOSPITAL_COMMUNITY): Payer: Self-pay

## 2015-01-29 DIAGNOSIS — Y998 Other external cause status: Secondary | ICD-10-CM | POA: Insufficient documentation

## 2015-01-29 DIAGNOSIS — Z8673 Personal history of transient ischemic attack (TIA), and cerebral infarction without residual deficits: Secondary | ICD-10-CM | POA: Diagnosis not present

## 2015-01-29 DIAGNOSIS — Y9289 Other specified places as the place of occurrence of the external cause: Secondary | ICD-10-CM | POA: Diagnosis not present

## 2015-01-29 DIAGNOSIS — Z79899 Other long term (current) drug therapy: Secondary | ICD-10-CM | POA: Diagnosis not present

## 2015-01-29 DIAGNOSIS — Z7902 Long term (current) use of antithrombotics/antiplatelets: Secondary | ICD-10-CM | POA: Diagnosis not present

## 2015-01-29 DIAGNOSIS — W19XXXA Unspecified fall, initial encounter: Secondary | ICD-10-CM

## 2015-01-29 DIAGNOSIS — Y9389 Activity, other specified: Secondary | ICD-10-CM | POA: Insufficient documentation

## 2015-01-29 DIAGNOSIS — S0181XA Laceration without foreign body of other part of head, initial encounter: Secondary | ICD-10-CM | POA: Diagnosis not present

## 2015-01-29 DIAGNOSIS — Z792 Long term (current) use of antibiotics: Secondary | ICD-10-CM | POA: Insufficient documentation

## 2015-01-29 DIAGNOSIS — S0990XA Unspecified injury of head, initial encounter: Secondary | ICD-10-CM | POA: Diagnosis not present

## 2015-01-29 DIAGNOSIS — E039 Hypothyroidism, unspecified: Secondary | ICD-10-CM | POA: Diagnosis not present

## 2015-01-29 DIAGNOSIS — W1830XA Fall on same level, unspecified, initial encounter: Secondary | ICD-10-CM | POA: Diagnosis not present

## 2015-01-29 DIAGNOSIS — M1990 Unspecified osteoarthritis, unspecified site: Secondary | ICD-10-CM | POA: Insufficient documentation

## 2015-01-29 NOTE — ED Notes (Signed)
Pt presents via EMS from [facility]) with c/o fall. Pt has a laceration above her left eye, bleeding controlled, pt is on Plavix. Pt also has alzheimer's. Pt is at her baseline per EMS.

## 2015-01-29 NOTE — Discharge Instructions (Signed)
Facial Laceration  A facial laceration is a cut on the face. These injuries can be painful and cause bleeding. Lacerations usually heal quickly, but they need special care to reduce scarring. DIAGNOSIS  Your health care provider will take a medical history, ask for details about how the injury occurred, and examine the wound to determine how deep the cut is. TREATMENT  Some facial lacerations may not require closure. Others may not be able to be closed because of an increased risk of infection. The risk of infection and the chance for successful closure will depend on various factors, including the amount of time since the injury occurred. The wound may be cleaned to help prevent infection. If closure is appropriate, pain medicines may be given if needed. Your health care provider will use stitches (sutures), wound glue (adhesive), or skin adhesive strips to repair the laceration. These tools bring the skin edges together to allow for faster healing and a better cosmetic outcome. If needed, you may also be given a tetanus shot. HOME CARE INSTRUCTIONS  Only take over-the-counter or prescription medicines as directed by your health care provider.  Follow your health care provider's instructions for wound care. These instructions will vary depending on the technique used for closing the wound. For Sutures:  Keep the wound clean and dry.   If you were given a bandage (dressing), you should change it at least once a day. Also change the dressing if it becomes wet or dirty, or as directed by your health care provider.   Wash the wound with soap and water 2 times a day. Rinse the wound off with water to remove all soap. Pat the wound dry with a clean towel.   After cleaning, apply a thin layer of the antibiotic ointment recommended by your health care provider. This will help prevent infection and keep the dressing from sticking.   You may shower as usual after the first 24 hours. Do not soak the  wound in water until the sutures are removed.   Get your sutures removed as directed by your health care provider. With facial lacerations, sutures should usually be taken out after 4-5 days to avoid stitch marks.   Wait a few days after your sutures are removed before applying any makeup. For Skin Adhesive Strips:  Keep the wound clean and dry.   Do not get the skin adhesive strips wet. You may bathe carefully, using caution to keep the wound dry.   If the wound gets wet, pat it dry with a clean towel.   Skin adhesive strips will fall off on their own. You may trim the strips as the wound heals. Do not remove skin adhesive strips that are still stuck to the wound. They will fall off in time.  For Wound Adhesive:  You may briefly wet your wound in the shower or bath. Do not soak or scrub the wound. Do not swim. Avoid periods of heavy sweating until the skin adhesive has fallen off on its own. After showering or bathing, gently pat the wound dry with a clean towel.   Do not apply liquid medicine, cream medicine, ointment medicine, or makeup to your wound while the skin adhesive is in place. This may loosen the film before your wound is healed.   If a dressing is placed over the wound, be careful not to apply tape directly over the skin adhesive. This may cause the adhesive to be pulled off before the wound is healed.   Avoid   prolonged exposure to sunlight or tanning lamps while the skin adhesive is in place.  The skin adhesive will usually remain in place for 5-10 days, then naturally fall off the skin. Do not pick at the adhesive film.  After Healing: Once the wound has healed, cover the wound with sunscreen during the day for 1 full year. This can help minimize scarring. Exposure to ultraviolet light in the first year will darken the scar. It can take 1-2 years for the scar to lose its redness and to heal completely.  SEEK IMMEDIATE MEDICAL CARE IF:  You have redness, pain, or  swelling around the wound.   You see ayellowish-white fluid (pus) coming from the wound.   You have chills or a fever.  MAKE SURE YOU:  Understand these instructions.  Will watch your condition.  Will get help right away if you are not doing well or get worse. Document Released: 12/13/2004 Document Revised: 08/26/2013 Document Reviewed: 06/18/2013 Exit[employer] Patient Information 2015 [employer]. This information is not intended to replace advice given to you by your health care provider. Make sure you discuss any questions you have with your health care provider.  

## 2015-01-29 NOTE — ED Notes (Signed)
Bed: WA13 Expected date:  Expected time:  Means of arrival:  Comments: EMS 

## 2015-01-29 NOTE — ED Provider Notes (Signed)
CSN: [id number]     Arrival date & time 01/29/15  2030 History   First MD Initiated Contact with Patient 01/29/15 2042     Chief Complaint  Patient presents with  . Fall      HPI Pt presents via EMS from [facility]) with c/o fall. Pt has a laceration above her left eye, bleeding controlled, pt is on Plavix. Pt also has alzheimer's. Pt is at her baseline per EMS. Past Medical History  Diagnosis Date  . Alcohol abuse   . Arthritis   . Depression   . Stroke   . Thyroid disease     hypothyroid   Past Surgical History  Procedure Laterality Date  . Cholecystectomy  2008  . Tonsillectomy and adenoidectomy    . Abdominal hysterectomy      partial  . Thyroid surgery    . Total hip arthroplasty Left 09/24/2014    Procedure: TOTAL HIP ARTHROPLASTY ANTERIOR APPROACH, LEFT;  Surgeon: Mcarthur Rossetti, MD;  Location: [facility];  Service: Orthopedics;  Laterality: Left;   Family History  Problem Relation Age of Onset  . Arthritis Mother   . Hyperlipidemia Mother   . Stroke Mother   . Hypertension Mother   . Mental illness Mother   . Cancer Father     lung  . Cancer Sister     ovarian/uterine  . Stroke Sister   . Hypertension Sister   . Hypertension Brother   . Diabetes Brother   . Cancer Daughter     breast   History  Substance Use Topics  . Smoking status: Never Smoker   . Smokeless tobacco: Never Used  . Alcohol Use: No     Comment: used to drink    OB History    No data available     Review of Systems  Unable to perform ROS     Allergies  Citalopram hydrobromide; Sulfa antibiotics; and Ciprofloxacin  Home Medications   Prior to Admission medications   Medication Sig Start Date End Date Taking? Authorizing Provider  Biotin 1 MG CAPS Take 1 capsule by mouth daily.   Yes Historical Provider, MD  Calcium Carb-Cholecalciferol 600-800 MG-UNIT TABS Take 1 tablet by mouth daily.   Yes Historical Provider, MD  clopidogrel (PLAVIX) 75 MG tablet  Take 75 mg by mouth daily.  04/07/14 04/07/15 Yes Historical Provider, MD  donepezil (ARICEPT) 10 MG tablet Take 1 tablet (10 mg total) by mouth at bedtime. 11/08/14  Yes Rebecca S Tat, DO  levothyroxine (SYNTHROID, LEVOTHROID) 112 MCG tablet TAKE 1 TABLET (112 MCG TOTAL) BY MOUTH DAILY.   Yes [name], MD  Probiotic Product (ACIDOPHILUS/GOAT MILK) CAPS Take 1 capsule by mouth daily.    Yes Historical Provider, MD  sertraline (ZOLOFT) 25 MG tablet Take 1 tablet (25 mg total) by mouth daily. 10/07/14  Yes [name], MD  acetaminophen (TYLENOL) 325 MG tablet Take 325 mg by mouth every 6 (six) hours as needed for mild pain (pain).     Historical Provider, MD  cephALEXin (KEFLEX) 500 MG capsule Take 1 capsule (500 mg total) by mouth 3 (three) times daily. Patient not taking: Reported on 01/29/2015 01/05/15   [name], MD  feeding supplement, ENSURE COMPLETE, (ENSURE COMPLETE) LIQD Take 237 mLs by mouth 2 (two) times daily between meals. Patient not taking: Reported on 01/29/2015 09/28/14   Nishant Dhungel, MD  senna (SENOKOT) 8.6 MG TABS tablet Take 1 tablet (8.6 mg total) by mouth daily  as needed for mild constipation. 09/28/14   Nishant Dhungel, MD   BP 147/59 mmHg  Pulse 75  Temp(Src) 97.7 F (36.5 C) (Oral)  Resp 18  SpO2 97% Physical Exam  Constitutional: She is oriented to person, place, and time. She appears well-developed and well-nourished. No distress.  HENT:  Head: Normocephalic.    Eyes: Pupils are equal, round, and reactive to light.  Neck: Normal range of motion.  Cardiovascular: Normal rate and intact distal pulses.   Pulmonary/Chest: No respiratory distress.  Abdominal: Normal appearance. She exhibits no distension.  Musculoskeletal: Normal range of motion.  Neurological: She is alert and oriented to person, place, and time. No cranial nerve deficit.  Skin: Skin is warm and dry. No rash noted.  Psychiatric: She has a normal mood and affect. Her  behavior is normal.  Nursing note and vitals reviewed.   ED Course  LACERATION REPAIR Date/Time: 01/29/2015 9:24 PM Performed by: Leonard Schwartz Authorized by: Leonard Schwartz Consent: Written consent not obtained. Consent given by: patient Body area: head/neck Location details: left cheek Laceration length: 2.5 cm Patient sedated: no Irrigation solution: saline Skin closure: glue and Steri-Strips Approximation difficulty: simple Dressing: 4x4 sterile gauze   (including critical care time) Labs Review Labs Reviewed - No data to display  Imaging Review Ct Head Wo Contrast  01/29/2015   CLINICAL DATA:  81 year old who fell at the nursing home earlier today, sustaining a laceration above the left eye. Patient currently on Plavix therapy. Initial encounter.  EXAM: CT HEAD WITHOUT CONTRAST  TECHNIQUE: Contiguous axial images were obtained from the base of the skull through the vertex without intravenous contrast.  COMPARISON:  CT head 04/03/2013, 02/06/2011.  MRI brain 02/17/2011.  FINDINGS: Moderate cortical and deep atrophy and mild cerebellar atrophy, unchanged. Severe changes of small vessel disease of the white matter diffusely, unchanged since 2014, progressive since 2012. No mass lesion. No midline shift. No acute hemorrhage or hematoma. No extra-axial fluid collections. No evidence of acute infarction.  No skull fracture or other focal osseous abnormality involving the skull. Visualized paranasal sinuses, bilateral mastoid air cells and bilateral middle ear cavities well-aerated. Bilateral carotid siphon and vertebral artery atherosclerosis.  IMPRESSION: 1. No acute intracranial abnormality. 2. Stable moderate cortical and deep atrophy and mild cerebellar atrophy. Stable severe chronic microvascular ischemic changes of the white matter.   Electronically Signed   By: Evangeline Dakin M.D.   On: 01/29/2015 20:35      MDM   Final diagnoses:  Fall  Facial laceration, initial encounter         Leonard Schwartz, MD 01/29/15 2141

## 2015-01-31 ENCOUNTER — Telehealth: Payer: Self-pay | Admitting: Family Medicine

## 2015-01-31 DIAGNOSIS — I10 Essential (primary) hypertension: Secondary | ICD-10-CM | POA: Diagnosis not present

## 2015-01-31 DIAGNOSIS — I509 Heart failure, unspecified: Secondary | ICD-10-CM | POA: Diagnosis not present

## 2015-01-31 DIAGNOSIS — R339 Retention of urine, unspecified: Secondary | ICD-10-CM | POA: Diagnosis not present

## 2015-01-31 DIAGNOSIS — Z466 Encounter for fitting and adjustment of urinary device: Secondary | ICD-10-CM | POA: Diagnosis not present

## 2015-01-31 DIAGNOSIS — F339 Major depressive disorder, recurrent, unspecified: Secondary | ICD-10-CM | POA: Diagnosis not present

## 2015-01-31 NOTE — Telephone Encounter (Signed)
Send urine for culture.

## 2015-01-31 NOTE — Telephone Encounter (Signed)
Pt had a fall over the weekend. Steri strips in place. Left upper cheek area next to eye.   lori would like order for UA.  Pt was catherized this am, already has specimen. Cloudy w/ slight odor.  Verbal order ok.

## 2015-01-31 NOTE — Telephone Encounter (Signed)
lori is informed.

## 2015-02-01 DIAGNOSIS — Z466 Encounter for fitting and adjustment of urinary device: Secondary | ICD-10-CM | POA: Diagnosis not present

## 2015-02-01 DIAGNOSIS — I10 Essential (primary) hypertension: Secondary | ICD-10-CM | POA: Diagnosis not present

## 2015-02-01 DIAGNOSIS — R339 Retention of urine, unspecified: Secondary | ICD-10-CM | POA: Diagnosis not present

## 2015-02-01 DIAGNOSIS — I509 Heart failure, unspecified: Secondary | ICD-10-CM | POA: Diagnosis not present

## 2015-02-01 DIAGNOSIS — F339 Major depressive disorder, recurrent, unspecified: Secondary | ICD-10-CM | POA: Diagnosis not present

## 2015-02-02 DIAGNOSIS — R339 Retention of urine, unspecified: Secondary | ICD-10-CM | POA: Diagnosis not present

## 2015-02-02 DIAGNOSIS — N390 Urinary tract infection, site not specified: Secondary | ICD-10-CM | POA: Diagnosis not present

## 2015-02-02 DIAGNOSIS — Z466 Encounter for fitting and adjustment of urinary device: Secondary | ICD-10-CM | POA: Diagnosis not present

## 2015-02-02 DIAGNOSIS — I509 Heart failure, unspecified: Secondary | ICD-10-CM | POA: Diagnosis not present

## 2015-02-02 DIAGNOSIS — F339 Major depressive disorder, recurrent, unspecified: Secondary | ICD-10-CM | POA: Diagnosis not present

## 2015-02-02 DIAGNOSIS — I10 Essential (primary) hypertension: Secondary | ICD-10-CM | POA: Diagnosis not present

## 2015-02-03 DIAGNOSIS — Z466 Encounter for fitting and adjustment of urinary device: Secondary | ICD-10-CM | POA: Diagnosis not present

## 2015-02-03 DIAGNOSIS — R339 Retention of urine, unspecified: Secondary | ICD-10-CM | POA: Diagnosis not present

## 2015-02-03 DIAGNOSIS — I10 Essential (primary) hypertension: Secondary | ICD-10-CM | POA: Diagnosis not present

## 2015-02-03 DIAGNOSIS — I509 Heart failure, unspecified: Secondary | ICD-10-CM | POA: Diagnosis not present

## 2015-02-03 DIAGNOSIS — F339 Major depressive disorder, recurrent, unspecified: Secondary | ICD-10-CM | POA: Diagnosis not present

## 2015-02-04 DIAGNOSIS — I10 Essential (primary) hypertension: Secondary | ICD-10-CM | POA: Diagnosis not present

## 2015-02-04 DIAGNOSIS — F339 Major depressive disorder, recurrent, unspecified: Secondary | ICD-10-CM | POA: Diagnosis not present

## 2015-02-04 DIAGNOSIS — I509 Heart failure, unspecified: Secondary | ICD-10-CM | POA: Diagnosis not present

## 2015-02-04 DIAGNOSIS — R339 Retention of urine, unspecified: Secondary | ICD-10-CM | POA: Diagnosis not present

## 2015-02-04 DIAGNOSIS — Z466 Encounter for fitting and adjustment of urinary device: Secondary | ICD-10-CM | POA: Diagnosis not present

## 2015-02-07 ENCOUNTER — Telehealth: Payer: Self-pay | Admitting: Family Medicine

## 2015-02-07 DIAGNOSIS — I10 Essential (primary) hypertension: Secondary | ICD-10-CM | POA: Diagnosis not present

## 2015-02-07 DIAGNOSIS — F339 Major depressive disorder, recurrent, unspecified: Secondary | ICD-10-CM | POA: Diagnosis not present

## 2015-02-07 DIAGNOSIS — I509 Heart failure, unspecified: Secondary | ICD-10-CM | POA: Diagnosis not present

## 2015-02-07 DIAGNOSIS — Z466 Encounter for fitting and adjustment of urinary device: Secondary | ICD-10-CM | POA: Diagnosis not present

## 2015-02-07 DIAGNOSIS — R339 Retention of urine, unspecified: Secondary | ICD-10-CM | POA: Diagnosis not present

## 2015-02-07 NOTE — Telephone Encounter (Signed)
[name] is aware. Faxed Rx order to nursing home.

## 2015-02-07 NOTE — Telephone Encounter (Signed)
Was culture sent?  Keflex 500 mg po tid for 7 days if she remains symptomatic.  If cx sent we need to get results.

## 2015-02-07 NOTE — Telephone Encounter (Signed)
[name] from [name] called to ask if an antibiotic has been ordered for the pt. She had  Bacteria in her urine

## 2015-02-09 DIAGNOSIS — I509 Heart failure, unspecified: Secondary | ICD-10-CM | POA: Diagnosis not present

## 2015-02-09 DIAGNOSIS — F339 Major depressive disorder, recurrent, unspecified: Secondary | ICD-10-CM | POA: Diagnosis not present

## 2015-02-09 DIAGNOSIS — R339 Retention of urine, unspecified: Secondary | ICD-10-CM | POA: Diagnosis not present

## 2015-02-09 DIAGNOSIS — I10 Essential (primary) hypertension: Secondary | ICD-10-CM | POA: Diagnosis not present

## 2015-02-09 DIAGNOSIS — Z466 Encounter for fitting and adjustment of urinary device: Secondary | ICD-10-CM | POA: Diagnosis not present

## 2015-02-11 ENCOUNTER — Ambulatory Visit: Payer: Commercial Managed Care - HMO | Admitting: Neurology

## 2015-02-11 DIAGNOSIS — Z466 Encounter for fitting and adjustment of urinary device: Secondary | ICD-10-CM | POA: Diagnosis not present

## 2015-02-11 DIAGNOSIS — I509 Heart failure, unspecified: Secondary | ICD-10-CM | POA: Diagnosis not present

## 2015-02-11 DIAGNOSIS — I10 Essential (primary) hypertension: Secondary | ICD-10-CM | POA: Diagnosis not present

## 2015-02-11 DIAGNOSIS — F339 Major depressive disorder, recurrent, unspecified: Secondary | ICD-10-CM | POA: Diagnosis not present

## 2015-02-11 DIAGNOSIS — R339 Retention of urine, unspecified: Secondary | ICD-10-CM | POA: Diagnosis not present

## 2015-02-15 DIAGNOSIS — Z466 Encounter for fitting and adjustment of urinary device: Secondary | ICD-10-CM | POA: Diagnosis not present

## 2015-02-15 DIAGNOSIS — F339 Major depressive disorder, recurrent, unspecified: Secondary | ICD-10-CM | POA: Diagnosis not present

## 2015-02-15 DIAGNOSIS — I10 Essential (primary) hypertension: Secondary | ICD-10-CM | POA: Diagnosis not present

## 2015-02-15 DIAGNOSIS — R339 Retention of urine, unspecified: Secondary | ICD-10-CM | POA: Diagnosis not present

## 2015-02-15 DIAGNOSIS — I509 Heart failure, unspecified: Secondary | ICD-10-CM | POA: Diagnosis not present

## 2015-02-16 DIAGNOSIS — R339 Retention of urine, unspecified: Secondary | ICD-10-CM | POA: Diagnosis not present

## 2015-02-16 DIAGNOSIS — I509 Heart failure, unspecified: Secondary | ICD-10-CM | POA: Diagnosis not present

## 2015-02-16 DIAGNOSIS — F339 Major depressive disorder, recurrent, unspecified: Secondary | ICD-10-CM | POA: Diagnosis not present

## 2015-02-16 DIAGNOSIS — Z466 Encounter for fitting and adjustment of urinary device: Secondary | ICD-10-CM | POA: Diagnosis not present

## 2015-02-16 DIAGNOSIS — I10 Essential (primary) hypertension: Secondary | ICD-10-CM | POA: Diagnosis not present

## 2015-02-17 DIAGNOSIS — Z466 Encounter for fitting and adjustment of urinary device: Secondary | ICD-10-CM | POA: Diagnosis not present

## 2015-02-17 DIAGNOSIS — I509 Heart failure, unspecified: Secondary | ICD-10-CM | POA: Diagnosis not present

## 2015-02-17 DIAGNOSIS — F339 Major depressive disorder, recurrent, unspecified: Secondary | ICD-10-CM | POA: Diagnosis not present

## 2015-02-17 DIAGNOSIS — I10 Essential (primary) hypertension: Secondary | ICD-10-CM | POA: Diagnosis not present

## 2015-02-17 DIAGNOSIS — R339 Retention of urine, unspecified: Secondary | ICD-10-CM | POA: Diagnosis not present

## 2015-02-18 ENCOUNTER — Other Ambulatory Visit: Payer: Self-pay

## 2015-02-18 ENCOUNTER — Encounter: Payer: Self-pay | Admitting: Neurology

## 2015-02-18 ENCOUNTER — Ambulatory Visit (INDEPENDENT_AMBULATORY_CARE_PROVIDER_SITE_OTHER): Payer: Commercial Managed Care - HMO | Admitting: Neurology

## 2015-02-18 VITALS — BP 110/58 | HR 80

## 2015-02-18 DIAGNOSIS — F0150 Vascular dementia without behavioral disturbance: Secondary | ICD-10-CM

## 2015-02-18 DIAGNOSIS — G214 Vascular parkinsonism: Secondary | ICD-10-CM | POA: Diagnosis not present

## 2015-02-18 MED ORDER — DONEPEZIL HCL 10 MG PO TABS: 10.0000 mg | ORAL_TABLET | Freq: Every day | ORAL | Status: DC

## 2015-02-18 NOTE — Progress Notes (Signed)
[name] was seen today in the movement disorders clinic for neurologic consultation at the request of [name], MD.  The consultation is for the evaluation of shuffling gait and to r/o PD.  Pt has a hx of significant memory change and hx of EtOH abuse.  She is accompanied by her caregiver from [facility] who supplements the hx.  The records that were made available to me were reviewed.  Shuffling is intermittent and has been going on "for a while."    11/08/14 update:  The patient returns today for follow-up, accompanied by her daughter who supplements the history.  The patient has history of vascular dementia and last visit I stressed the need for 24-hour per day care.  Only 5 days after our last visit the patient fell and fractured her left hip and ended up having left hip replacement on 09/24/2014.  Her caregiver was present when she fx the hip.  She does now have 24-hour per day care.  In addition to the dementia, the patient has vascular parkinsonism.  I was very resistant to trying levodopa for multiple reasons, including the fact that only 30% of patients with vascular parkinsonism responds to it, in addition to the fact that she has profound memory change which can be worsened by levodopa and previously was living alone (with some daytime caregiving).  Pts daughter states that there is a lot more behavioral/agitation issues.  It occurs from afternoon (about 2pm) until 4pm and then at night.  Her bedtime is inconsistent, depending on which caregiver is with her.  There are hallucinations.  Her daughter states that they were on memory meds (maybe namenda) at some point but cannot remember why they were stopped but thinks that it was because of frequent bladder infections.  No falls since hip fx but fearful of falling now.    02/18/15 update:  The patient is accompanied by her daughter who supplements the history.  This particular daughter has never accompanied her.  Records were  reviewed since last visit.  The patient has a history of vascular parkinsonism.  She also has profound vascular dementia.  She is on Aricept which was started last visit.  She is tolerating it well.  She ended up in the emergency room on March 12 after a fall in which she sustained a laceration above her left eye.  A CT of the brain was performed which is not acute.  No evidence of bleed.  She has not had very much behavioral/agitation issues.  She does now have a Foley catheter in place.  Her daughter states that she was surprised that the patient did not recognize her the other day and stated, "you must be a nice lady."   Neuroimaging has  previously been performed.  It is available for my review today.  CT of the brain was performed in May, 2014.  There is evidence of very significant small vessel disease.  PREVIOUS MEDICATIONS: ? namenda  ALLERGIES:   Allergies  Allergen Reactions  . Citalopram Hydrobromide     Shaky inside  . Sulfa Antibiotics     rash  . Ciprofloxacin Rash    CURRENT MEDICATIONS:  Outpatient Encounter Prescriptions as of 02/18/2015  Medication Sig  . acetaminophen (TYLENOL) 325 MG tablet Take 325 mg by mouth every 6 (six) hours as needed for mild pain (pain).   . Calcium Carb-Cholecalciferol 600-800 MG-UNIT TABS Take 1 tablet by mouth daily.  . clopidogrel (PLAVIX) 75 MG tablet Take  75 mg by mouth daily.   Marland Kitchen donepezil (ARICEPT) 10 MG tablet Take 1 tablet (10 mg total) by mouth at bedtime.  Marland Kitchen levothyroxine (SYNTHROID, LEVOTHROID) 112 MCG tablet TAKE 1 TABLET (112 MCG TOTAL) BY MOUTH DAILY.  . Multiple Vitamin (MULTIVITAMIN) tablet Take 1 tablet by mouth daily.  . Probiotic Product (ACIDOPHILUS/GOAT MILK) CAPS Take 1 capsule by mouth daily.   . sertraline (ZOLOFT) 25 MG tablet Take 1 tablet (25 mg total) by mouth daily.  . [DISCONTINUED] donepezil (ARICEPT) 10 MG tablet Take 1 tablet (10 mg total) by mouth at bedtime.  . senna (SENOKOT) 8.6 MG TABS tablet Take 1 tablet  (8.6 mg total) by mouth daily as needed for mild constipation. (Patient not taking: Reported on 02/18/2015)  . [DISCONTINUED] Biotin 1 MG CAPS Take 1 capsule by mouth daily.  . [DISCONTINUED] cephALEXin (KEFLEX) 500 MG capsule Take 1 capsule (500 mg total) by mouth 3 (three) times daily. (Patient not taking: Reported on 01/29/2015)  . [DISCONTINUED] feeding supplement, ENSURE COMPLETE, (ENSURE COMPLETE) LIQD Take 237 mLs by mouth 2 (two) times daily between meals. (Patient not taking: Reported on 02/18/2015)    PAST MEDICAL HISTORY:   Past Medical History  Diagnosis Date  . Alcohol abuse   . Arthritis   . Depression   . Stroke   . Thyroid disease     hypothyroid    PAST SURGICAL HISTORY:   Past Surgical History  Procedure Laterality Date  . Cholecystectomy  2008  . Tonsillectomy and adenoidectomy    . Abdominal hysterectomy      partial  . Thyroid surgery    . Total hip arthroplasty Left 09/24/2014    Procedure: TOTAL HIP ARTHROPLASTY ANTERIOR APPROACH, LEFT;  Surgeon: Mcarthur Rossetti, MD;  Location: [facility];  Service: Orthopedics;  Laterality: Left;    SOCIAL HISTORY:   History   Social History  . Marital Status: Widowed    Spouse Name: N/A  . Number of Children: N/A  . Years of Education: N/A   Occupational History  . Not on file.   Social History Main Topics  . Smoking status: Never Smoker   . Smokeless tobacco: Never Used  . Alcohol Use: No     Comment: used to drink   . Drug Use: No  . Sexual Activity: Not on file   Other Topics Concern  . Not on file   Social History Narrative    FAMILY HISTORY:   Family Status  Relation Status Death Age  . Mother Deceased     stroke   . Father Deceased     lung cancer  . Sister Deceased     ovarian cancer  . Sister Deceased     stroke  . Sister Alive   . Sister Alive   . Sister Alive   . Brother Alive   . Brother Alive   . Daughter Alive     breast cancer  . Daughter Alive     healthy    ROS:  A  complete 10 system review of systems was obtained and was unremarkable apart from what is mentioned above.  PHYSICAL EXAMINATION:    VITALS:   Filed Vitals:   02/18/15 1506  BP: 110/58  Pulse: 80    GEN:  The patient appears stated age and is in NAD. HEENT:  Normocephalic.  There are Steri-Strips present over the abrasion over the left eye.  The mucous membranes are moist. The superficial temporal arteries are without ropiness or  tenderness. CV:  RRR Lungs:  CTAB Neck/HEME:  There are no carotid bruits bilaterally.  Neurological examination:  Orientation: The patient scored a 7/30 on her MMSE today on 02/18/15.   Montreal Cognitive Assessment  09/17/2014  Visuospatial/ Executive (0/5) 0  Naming (0/3) 2  Attention: Read list of digits (0/2) 1  Attention: Read list of letters (0/1) 0  Attention: Serial 7 subtraction starting at 100 (0/3) 0  Language: Repeat phrase (0/2) 0  Language : Fluency (0/1) 0  Abstraction (0/2) 0  Delayed Recall (0/5) 0  Orientation (0/6) 4  Total 7  Adjusted Score (based on education) 7    Cranial nerves: There is good facial symmetry.  There is facial hypomimia. The speech is fluent and clear. Soft palate rises symmetrically and there is no tongue deviation. Hearing is intact to conversational tone. Sensation: Sensation is intact to light touch throughout. Motor: Strength is 5/5 in the bilateral upper and lower extremities with the exception of shoulder abduction bilaterally and strength there is 3/5.   Shoulder shrug is equal and symmetric.  There is no pronator drift.   Movement examination: Tone: There is no true rigidity. Coordination:  There is no true decremation with rapid alternating movements, but she is slow with virtually all forms of rapid alternating movements and is also somewhat apraxic when performing rapid alternating movements Gait and Station: The patient arises with assistance on both sides.  She drags the left leg with ambulation.   She is very slow.  ASSESSMENT/PLAN:  1.  Parkinsonism, suspect vascular in nature.  -I. had a long discussion with the patient and her daughter.  I reviewed with her daughter what I reviewed with the caregiver last visit.  Her daughter agreed that she did not want her on levodopa given the concern that it could increase confusion and hallucinations. 2.  profound dementia, likely vascular.  Patient also has a history of alcohol abuse, but is no longer drinking.  -After hip fx and subsequent replacement on 09/24/14, the patient now has 24 hour per day care.  -Will remain on Aricept, 10 mg daily.  -Long talk with the patient and this particular daughter, as she has never accompanied the patient to the visit.  She had many questions and I answered them to the best of my ability.  Greater than 50% of the 30 minute visit was spent in counseling discussing safety as well as what to expect now and potentially in the future. 3.  Return in about 1 year (around 02/18/2016).

## 2015-02-21 ENCOUNTER — Telehealth: Payer: Self-pay | Admitting: Family Medicine

## 2015-02-21 DIAGNOSIS — R339 Retention of urine, unspecified: Secondary | ICD-10-CM | POA: Diagnosis not present

## 2015-02-21 DIAGNOSIS — F339 Major depressive disorder, recurrent, unspecified: Secondary | ICD-10-CM | POA: Diagnosis not present

## 2015-02-21 DIAGNOSIS — I509 Heart failure, unspecified: Secondary | ICD-10-CM | POA: Diagnosis not present

## 2015-02-21 DIAGNOSIS — Z466 Encounter for fitting and adjustment of urinary device: Secondary | ICD-10-CM | POA: Diagnosis not present

## 2015-02-21 DIAGNOSIS — I10 Essential (primary) hypertension: Secondary | ICD-10-CM | POA: Diagnosis not present

## 2015-02-21 NOTE — Telephone Encounter (Signed)
[facility] needs verbal to Extend home health  physical therapy "1/ wk one/  2 wk 2/  1 wk one"

## 2015-02-21 NOTE — Telephone Encounter (Signed)
[name] is informed.

## 2015-02-21 NOTE — Telephone Encounter (Signed)
OK 

## 2015-02-23 DIAGNOSIS — I10 Essential (primary) hypertension: Secondary | ICD-10-CM | POA: Diagnosis not present

## 2015-02-23 DIAGNOSIS — Z466 Encounter for fitting and adjustment of urinary device: Secondary | ICD-10-CM | POA: Diagnosis not present

## 2015-02-23 DIAGNOSIS — R339 Retention of urine, unspecified: Secondary | ICD-10-CM | POA: Diagnosis not present

## 2015-02-23 DIAGNOSIS — F339 Major depressive disorder, recurrent, unspecified: Secondary | ICD-10-CM | POA: Diagnosis not present

## 2015-02-23 DIAGNOSIS — I509 Heart failure, unspecified: Secondary | ICD-10-CM | POA: Diagnosis not present

## 2015-02-25 DIAGNOSIS — I10 Essential (primary) hypertension: Secondary | ICD-10-CM | POA: Diagnosis not present

## 2015-02-25 DIAGNOSIS — Z466 Encounter for fitting and adjustment of urinary device: Secondary | ICD-10-CM | POA: Diagnosis not present

## 2015-02-25 DIAGNOSIS — F339 Major depressive disorder, recurrent, unspecified: Secondary | ICD-10-CM | POA: Diagnosis not present

## 2015-02-25 DIAGNOSIS — I509 Heart failure, unspecified: Secondary | ICD-10-CM | POA: Diagnosis not present

## 2015-02-25 DIAGNOSIS — R339 Retention of urine, unspecified: Secondary | ICD-10-CM | POA: Diagnosis not present

## 2015-02-27 DIAGNOSIS — S129XXA Fracture of neck, unspecified, initial encounter: Secondary | ICD-10-CM | POA: Diagnosis not present

## 2015-02-28 ENCOUNTER — Telehealth: Payer: Self-pay | Admitting: Family Medicine

## 2015-02-28 ENCOUNTER — Telehealth: Payer: Self-pay

## 2015-02-28 DIAGNOSIS — Z466 Encounter for fitting and adjustment of urinary device: Secondary | ICD-10-CM | POA: Diagnosis not present

## 2015-02-28 DIAGNOSIS — R339 Retention of urine, unspecified: Secondary | ICD-10-CM | POA: Diagnosis not present

## 2015-02-28 DIAGNOSIS — F339 Major depressive disorder, recurrent, unspecified: Secondary | ICD-10-CM | POA: Diagnosis not present

## 2015-02-28 DIAGNOSIS — I10 Essential (primary) hypertension: Secondary | ICD-10-CM | POA: Diagnosis not present

## 2015-02-28 DIAGNOSIS — I509 Heart failure, unspecified: Secondary | ICD-10-CM | POA: Diagnosis not present

## 2015-02-28 NOTE — Telephone Encounter (Signed)
Rx changed and sent to [place] office.

## 2015-02-28 NOTE — Telephone Encounter (Signed)
Pt daughter would like to change patient Rx back to Zoloft. Pt has been hallucinating.

## 2015-02-28 NOTE — Telephone Encounter (Signed)
D/c Trazodone and start back Sertraline 25 mg once daily- would start low given her age.

## 2015-02-28 NOTE — Telephone Encounter (Signed)
[place] Patient Name: [name] DOB: 07-25-1931 Initial Comment Caller states 81 yr old mother has been on zoloft several years; [facility] called and asked for her to be prescribed something else; she has been hallucinating, irritable, anxious and not herself since med change; wants her off the medicine; and back on zoloft; was much better on zoloft. thinks they put her in the bed early because they don't want to fool with her; she doesn't go to bed at 8pm; has medical POA; Nurse Assessment Nurse: Markus Daft, RN, Sherre Poot Date/Time (Eastern Time): 02/28/2015 8:38:10 AM Confirm and document reason for call. If symptomatic, describe symptoms. ---Caller states her mother has been on Zoloft several years. She was having trouble sleeping and [facility] called her MD added and asked for her to be prescribed something else, possibly Trazadone (per EMR) . Since this change, she has been hallucinating, irritable, anxious. Dtr now wants her off the medicine; and back on Zoloft. She is not sure if she is on Zoloft, and this med, or just this med (per EMR - looks like Zoloft stopped). She was much better on Zoloft. Caller thinks they put her in the bed too early because they don't want to fool with her, she has h/o dementia. She doesn't go to bed at 8 pm. (Caller has medical POA. -- Caller not with pt, RN attempting to reach nurse at [facility] - [phone].). Has the patient traveled out of the country within the last 30 days? ---Not Applicable Does the patient require triage? ---Declined Triage Please document clinical information provided and list any resource used. ---RN unable to triage pt s/s unable to reach the nurse after 2 attempts. Caller aware and will ask them to call us to discuss present s/s. Caller aware that RN will send a note to MD with this info. Caller verb.  understanding.

## 2015-03-01 NOTE — Telephone Encounter (Signed)
We stopped her Trazodone and started back Sertraline 25 mg po qd.

## 2015-03-02 NOTE — Telephone Encounter (Signed)
Noted  

## 2015-03-02 NOTE — Telephone Encounter (Signed)
Montrice states the pts daughter is aware of the med. change.

## 2015-03-04 DIAGNOSIS — I509 Heart failure, unspecified: Secondary | ICD-10-CM | POA: Diagnosis not present

## 2015-03-04 DIAGNOSIS — F339 Major depressive disorder, recurrent, unspecified: Secondary | ICD-10-CM | POA: Diagnosis not present

## 2015-03-04 DIAGNOSIS — I10 Essential (primary) hypertension: Secondary | ICD-10-CM | POA: Diagnosis not present

## 2015-03-04 DIAGNOSIS — R339 Retention of urine, unspecified: Secondary | ICD-10-CM | POA: Diagnosis not present

## 2015-03-04 DIAGNOSIS — Z466 Encounter for fitting and adjustment of urinary device: Secondary | ICD-10-CM | POA: Diagnosis not present

## 2015-03-07 DIAGNOSIS — R339 Retention of urine, unspecified: Secondary | ICD-10-CM | POA: Diagnosis not present

## 2015-03-07 DIAGNOSIS — Z466 Encounter for fitting and adjustment of urinary device: Secondary | ICD-10-CM | POA: Diagnosis not present

## 2015-03-07 DIAGNOSIS — I10 Essential (primary) hypertension: Secondary | ICD-10-CM | POA: Diagnosis not present

## 2015-03-07 DIAGNOSIS — F339 Major depressive disorder, recurrent, unspecified: Secondary | ICD-10-CM | POA: Diagnosis not present

## 2015-03-07 DIAGNOSIS — I509 Heart failure, unspecified: Secondary | ICD-10-CM | POA: Diagnosis not present

## 2015-03-08 DIAGNOSIS — R339 Retention of urine, unspecified: Secondary | ICD-10-CM | POA: Diagnosis not present

## 2015-03-08 DIAGNOSIS — I509 Heart failure, unspecified: Secondary | ICD-10-CM | POA: Diagnosis not present

## 2015-03-08 DIAGNOSIS — I10 Essential (primary) hypertension: Secondary | ICD-10-CM | POA: Diagnosis not present

## 2015-03-08 DIAGNOSIS — Z466 Encounter for fitting and adjustment of urinary device: Secondary | ICD-10-CM | POA: Diagnosis not present

## 2015-03-08 DIAGNOSIS — F339 Major depressive disorder, recurrent, unspecified: Secondary | ICD-10-CM | POA: Diagnosis not present

## 2015-03-10 DIAGNOSIS — Z466 Encounter for fitting and adjustment of urinary device: Secondary | ICD-10-CM | POA: Diagnosis not present

## 2015-03-10 DIAGNOSIS — F339 Major depressive disorder, recurrent, unspecified: Secondary | ICD-10-CM | POA: Diagnosis not present

## 2015-03-10 DIAGNOSIS — R339 Retention of urine, unspecified: Secondary | ICD-10-CM | POA: Diagnosis not present

## 2015-03-10 DIAGNOSIS — I10 Essential (primary) hypertension: Secondary | ICD-10-CM | POA: Diagnosis not present

## 2015-03-10 DIAGNOSIS — I509 Heart failure, unspecified: Secondary | ICD-10-CM | POA: Diagnosis not present

## 2015-03-16 DIAGNOSIS — N390 Urinary tract infection, site not specified: Secondary | ICD-10-CM | POA: Diagnosis not present

## 2015-03-16 DIAGNOSIS — I509 Heart failure, unspecified: Secondary | ICD-10-CM | POA: Diagnosis not present

## 2015-03-16 DIAGNOSIS — I10 Essential (primary) hypertension: Secondary | ICD-10-CM | POA: Diagnosis not present

## 2015-03-16 DIAGNOSIS — Z466 Encounter for fitting and adjustment of urinary device: Secondary | ICD-10-CM | POA: Diagnosis not present

## 2015-03-16 DIAGNOSIS — F339 Major depressive disorder, recurrent, unspecified: Secondary | ICD-10-CM | POA: Diagnosis not present

## 2015-03-16 DIAGNOSIS — R339 Retention of urine, unspecified: Secondary | ICD-10-CM | POA: Diagnosis not present

## 2015-03-18 DIAGNOSIS — I509 Heart failure, unspecified: Secondary | ICD-10-CM | POA: Diagnosis not present

## 2015-03-18 DIAGNOSIS — F339 Major depressive disorder, recurrent, unspecified: Secondary | ICD-10-CM | POA: Diagnosis not present

## 2015-03-18 DIAGNOSIS — R339 Retention of urine, unspecified: Secondary | ICD-10-CM | POA: Diagnosis not present

## 2015-03-18 DIAGNOSIS — I10 Essential (primary) hypertension: Secondary | ICD-10-CM | POA: Diagnosis not present

## 2015-03-18 DIAGNOSIS — Z466 Encounter for fitting and adjustment of urinary device: Secondary | ICD-10-CM | POA: Diagnosis not present

## 2015-03-19 DIAGNOSIS — Z466 Encounter for fitting and adjustment of urinary device: Secondary | ICD-10-CM | POA: Diagnosis not present

## 2015-03-19 DIAGNOSIS — I10 Essential (primary) hypertension: Secondary | ICD-10-CM | POA: Diagnosis not present

## 2015-03-19 DIAGNOSIS — R339 Retention of urine, unspecified: Secondary | ICD-10-CM | POA: Diagnosis not present

## 2015-03-19 DIAGNOSIS — M150 Primary generalized (osteo)arthritis: Secondary | ICD-10-CM | POA: Diagnosis not present

## 2015-03-19 DIAGNOSIS — I509 Heart failure, unspecified: Secondary | ICD-10-CM | POA: Diagnosis not present

## 2015-03-21 ENCOUNTER — Telehealth: Payer: Self-pay | Admitting: Family Medicine

## 2015-03-21 NOTE — Telephone Encounter (Signed)
Liji is informed. Per Dr. Jacinto Reap

## 2015-03-21 NOTE — Telephone Encounter (Signed)
OK 

## 2015-03-21 NOTE — Telephone Encounter (Signed)
Liji from [facility] will see patient twice a wk for 6 wks and one time  for 1 wk for recertification for physical therapy this is a  new episode. Please call with verbal order

## 2015-03-22 ENCOUNTER — Telehealth: Payer: Self-pay | Admitting: Family Medicine

## 2015-03-22 DIAGNOSIS — Z466 Encounter for fitting and adjustment of urinary device: Secondary | ICD-10-CM | POA: Diagnosis not present

## 2015-03-22 DIAGNOSIS — I509 Heart failure, unspecified: Secondary | ICD-10-CM | POA: Diagnosis not present

## 2015-03-22 DIAGNOSIS — R339 Retention of urine, unspecified: Secondary | ICD-10-CM | POA: Diagnosis not present

## 2015-03-22 DIAGNOSIS — I10 Essential (primary) hypertension: Secondary | ICD-10-CM | POA: Diagnosis not present

## 2015-03-22 DIAGNOSIS — M150 Primary generalized (osteo)arthritis: Secondary | ICD-10-CM | POA: Diagnosis not present

## 2015-03-22 NOTE — Telephone Encounter (Signed)
This has been answered and discussed with my nurse.  She has Pseudomonas UTI.  Unfortunately, she is allergic to Cipro and only IV alternatives available.  Will see if they can administer Ceftazidime 1 gm every 12 hours f or 7 days.

## 2015-03-22 NOTE — Telephone Encounter (Signed)
Daughter is calling to fu on a UTI lab done on pt a couple of weeks ago. Daughter states pt has continually had a uti and  [facility] has failed to up w/ dr and now pt has back pain, rash on face and generally feels bad. Would like to know if dr burchette would send rx to [facility]. Pt has catheter for 2 wee

## 2015-03-22 NOTE — Telephone Encounter (Signed)
Levada Dy from [facility] called to say that they don't do any  IV all is done through mouth. She said she received an order for IV antibiotics. Would like a call back  [phone]

## 2015-03-23 ENCOUNTER — Telehealth: Payer: Self-pay | Admitting: Family Medicine

## 2015-03-23 ENCOUNTER — Telehealth: Payer: Self-pay

## 2015-03-23 NOTE — Telephone Encounter (Signed)
Left detailed message on daughter kim Vm stating that patient would need to have a IV antibiotic per Dr. Jacinto Reap. And that Dr. B is aware that patient has tried Keflex in the past for UTI symptoms.

## 2015-03-23 NOTE — Telephone Encounter (Signed)
Levada Dy informed per Dr. Elease Hashimoto to try to get home health in to do a IV if not patient will need to go to hospital because of patient allergies patient can not talk any medication to treat UTI. Levada Dy stated that she will call the patient daughter.

## 2015-03-23 NOTE — Telephone Encounter (Addendum)
Levada Dy said [facility] will not do IV abx. Levada Dy will call urologist and find out with abx pt has tried in past and will call  Back. Angela called [facility] urologist and pt has tried  keflex and cefdinir and did well per angela. Please fax order to [facility]

## 2015-03-23 NOTE — Telephone Encounter (Signed)
Spoke with [name].  She has already consulted with Urology and they are going to try her on Omnicef oral.

## 2015-03-23 NOTE — Telephone Encounter (Signed)
Can you please call patient daughter [name]. Daughter is wanting you to prescribe a medication that patient can take oral instead of the injection. Left message on Vm that you were aware that per your message that if home health could not come in to go to the hospital.

## 2015-03-25 DIAGNOSIS — M150 Primary generalized (osteo)arthritis: Secondary | ICD-10-CM | POA: Diagnosis not present

## 2015-03-25 DIAGNOSIS — I509 Heart failure, unspecified: Secondary | ICD-10-CM | POA: Diagnosis not present

## 2015-03-25 DIAGNOSIS — Z466 Encounter for fitting and adjustment of urinary device: Secondary | ICD-10-CM | POA: Diagnosis not present

## 2015-03-25 DIAGNOSIS — R339 Retention of urine, unspecified: Secondary | ICD-10-CM | POA: Diagnosis not present

## 2015-03-25 DIAGNOSIS — I10 Essential (primary) hypertension: Secondary | ICD-10-CM | POA: Diagnosis not present

## 2015-03-28 DIAGNOSIS — Z466 Encounter for fitting and adjustment of urinary device: Secondary | ICD-10-CM | POA: Diagnosis not present

## 2015-03-28 DIAGNOSIS — I10 Essential (primary) hypertension: Secondary | ICD-10-CM | POA: Diagnosis not present

## 2015-03-28 DIAGNOSIS — R339 Retention of urine, unspecified: Secondary | ICD-10-CM | POA: Diagnosis not present

## 2015-03-28 DIAGNOSIS — M150 Primary generalized (osteo)arthritis: Secondary | ICD-10-CM | POA: Diagnosis not present

## 2015-03-28 DIAGNOSIS — I509 Heart failure, unspecified: Secondary | ICD-10-CM | POA: Diagnosis not present

## 2015-03-29 DIAGNOSIS — S129XXA Fracture of neck, unspecified, initial encounter: Secondary | ICD-10-CM | POA: Diagnosis not present

## 2015-03-30 DIAGNOSIS — I509 Heart failure, unspecified: Secondary | ICD-10-CM | POA: Diagnosis not present

## 2015-03-30 DIAGNOSIS — I10 Essential (primary) hypertension: Secondary | ICD-10-CM | POA: Diagnosis not present

## 2015-03-30 DIAGNOSIS — R339 Retention of urine, unspecified: Secondary | ICD-10-CM | POA: Diagnosis not present

## 2015-03-30 DIAGNOSIS — Z466 Encounter for fitting and adjustment of urinary device: Secondary | ICD-10-CM | POA: Diagnosis not present

## 2015-03-30 DIAGNOSIS — M150 Primary generalized (osteo)arthritis: Secondary | ICD-10-CM | POA: Diagnosis not present

## 2015-04-05 DIAGNOSIS — M150 Primary generalized (osteo)arthritis: Secondary | ICD-10-CM | POA: Diagnosis not present

## 2015-04-05 DIAGNOSIS — Z466 Encounter for fitting and adjustment of urinary device: Secondary | ICD-10-CM | POA: Diagnosis not present

## 2015-04-05 DIAGNOSIS — I10 Essential (primary) hypertension: Secondary | ICD-10-CM | POA: Diagnosis not present

## 2015-04-05 DIAGNOSIS — I509 Heart failure, unspecified: Secondary | ICD-10-CM | POA: Diagnosis not present

## 2015-04-05 DIAGNOSIS — R339 Retention of urine, unspecified: Secondary | ICD-10-CM | POA: Diagnosis not present

## 2015-04-07 DIAGNOSIS — N390 Urinary tract infection, site not specified: Secondary | ICD-10-CM | POA: Diagnosis not present

## 2015-04-08 DIAGNOSIS — I10 Essential (primary) hypertension: Secondary | ICD-10-CM | POA: Diagnosis not present

## 2015-04-08 DIAGNOSIS — R339 Retention of urine, unspecified: Secondary | ICD-10-CM | POA: Diagnosis not present

## 2015-04-08 DIAGNOSIS — Z466 Encounter for fitting and adjustment of urinary device: Secondary | ICD-10-CM | POA: Diagnosis not present

## 2015-04-08 DIAGNOSIS — I509 Heart failure, unspecified: Secondary | ICD-10-CM | POA: Diagnosis not present

## 2015-04-08 DIAGNOSIS — M150 Primary generalized (osteo)arthritis: Secondary | ICD-10-CM | POA: Diagnosis not present

## 2015-04-09 DIAGNOSIS — Z466 Encounter for fitting and adjustment of urinary device: Secondary | ICD-10-CM | POA: Diagnosis not present

## 2015-04-09 DIAGNOSIS — M150 Primary generalized (osteo)arthritis: Secondary | ICD-10-CM | POA: Diagnosis not present

## 2015-04-09 DIAGNOSIS — I10 Essential (primary) hypertension: Secondary | ICD-10-CM | POA: Diagnosis not present

## 2015-04-09 DIAGNOSIS — R339 Retention of urine, unspecified: Secondary | ICD-10-CM | POA: Diagnosis not present

## 2015-04-09 DIAGNOSIS — I509 Heart failure, unspecified: Secondary | ICD-10-CM | POA: Diagnosis not present

## 2015-04-14 DIAGNOSIS — R339 Retention of urine, unspecified: Secondary | ICD-10-CM | POA: Diagnosis not present

## 2015-04-14 DIAGNOSIS — Z466 Encounter for fitting and adjustment of urinary device: Secondary | ICD-10-CM | POA: Diagnosis not present

## 2015-04-14 DIAGNOSIS — I10 Essential (primary) hypertension: Secondary | ICD-10-CM | POA: Diagnosis not present

## 2015-04-14 DIAGNOSIS — I509 Heart failure, unspecified: Secondary | ICD-10-CM | POA: Diagnosis not present

## 2015-04-14 DIAGNOSIS — M150 Primary generalized (osteo)arthritis: Secondary | ICD-10-CM | POA: Diagnosis not present

## 2015-04-15 DIAGNOSIS — I509 Heart failure, unspecified: Secondary | ICD-10-CM | POA: Diagnosis not present

## 2015-04-15 DIAGNOSIS — R339 Retention of urine, unspecified: Secondary | ICD-10-CM | POA: Diagnosis not present

## 2015-04-15 DIAGNOSIS — Z466 Encounter for fitting and adjustment of urinary device: Secondary | ICD-10-CM | POA: Diagnosis not present

## 2015-04-15 DIAGNOSIS — M150 Primary generalized (osteo)arthritis: Secondary | ICD-10-CM | POA: Diagnosis not present

## 2015-04-15 DIAGNOSIS — I10 Essential (primary) hypertension: Secondary | ICD-10-CM | POA: Diagnosis not present

## 2015-04-23 DIAGNOSIS — R339 Retention of urine, unspecified: Secondary | ICD-10-CM | POA: Diagnosis not present

## 2015-04-23 DIAGNOSIS — Z466 Encounter for fitting and adjustment of urinary device: Secondary | ICD-10-CM | POA: Diagnosis not present

## 2015-04-23 DIAGNOSIS — M150 Primary generalized (osteo)arthritis: Secondary | ICD-10-CM | POA: Diagnosis not present

## 2015-04-23 DIAGNOSIS — I509 Heart failure, unspecified: Secondary | ICD-10-CM | POA: Diagnosis not present

## 2015-04-23 DIAGNOSIS — I10 Essential (primary) hypertension: Secondary | ICD-10-CM | POA: Diagnosis not present

## 2015-04-28 DIAGNOSIS — M150 Primary generalized (osteo)arthritis: Secondary | ICD-10-CM | POA: Diagnosis not present

## 2015-04-28 DIAGNOSIS — I509 Heart failure, unspecified: Secondary | ICD-10-CM | POA: Diagnosis not present

## 2015-04-28 DIAGNOSIS — I10 Essential (primary) hypertension: Secondary | ICD-10-CM | POA: Diagnosis not present

## 2015-04-28 DIAGNOSIS — Z466 Encounter for fitting and adjustment of urinary device: Secondary | ICD-10-CM | POA: Diagnosis not present

## 2015-04-28 DIAGNOSIS — R339 Retention of urine, unspecified: Secondary | ICD-10-CM | POA: Diagnosis not present

## 2015-04-29 DIAGNOSIS — S129XXA Fracture of neck, unspecified, initial encounter: Secondary | ICD-10-CM | POA: Diagnosis not present

## 2015-05-02 DIAGNOSIS — N390 Urinary tract infection, site not specified: Secondary | ICD-10-CM | POA: Diagnosis not present

## 2015-05-04 ENCOUNTER — Encounter: Payer: Self-pay | Admitting: Family Medicine

## 2015-05-05 ENCOUNTER — Telehealth: Payer: Self-pay | Admitting: Family Medicine

## 2015-05-05 DIAGNOSIS — M150 Primary generalized (osteo)arthritis: Secondary | ICD-10-CM | POA: Diagnosis not present

## 2015-05-05 DIAGNOSIS — I10 Essential (primary) hypertension: Secondary | ICD-10-CM | POA: Diagnosis not present

## 2015-05-05 DIAGNOSIS — Z466 Encounter for fitting and adjustment of urinary device: Secondary | ICD-10-CM | POA: Diagnosis not present

## 2015-05-05 DIAGNOSIS — I509 Heart failure, unspecified: Secondary | ICD-10-CM | POA: Diagnosis not present

## 2015-05-05 DIAGNOSIS — R339 Retention of urine, unspecified: Secondary | ICD-10-CM | POA: Diagnosis not present

## 2015-05-05 NOTE — Telephone Encounter (Signed)
Dee called from [facility] stating she has faxed the necessary doc for pt to get results and haven't heard anything back yet. It is imperative that this is handled quickly in order to move forward with pt.

## 2015-05-05 NOTE — Telephone Encounter (Signed)
Spoke with Karena Addison that has soon as i see the other results will give to Dr. B to see then fax back to [facility]

## 2015-05-12 DIAGNOSIS — R32 Unspecified urinary incontinence: Secondary | ICD-10-CM | POA: Diagnosis not present

## 2015-05-12 DIAGNOSIS — I509 Heart failure, unspecified: Secondary | ICD-10-CM | POA: Diagnosis not present

## 2015-05-12 DIAGNOSIS — I10 Essential (primary) hypertension: Secondary | ICD-10-CM | POA: Diagnosis not present

## 2015-05-12 DIAGNOSIS — M150 Primary generalized (osteo)arthritis: Secondary | ICD-10-CM | POA: Diagnosis not present

## 2015-05-12 DIAGNOSIS — Z466 Encounter for fitting and adjustment of urinary device: Secondary | ICD-10-CM | POA: Diagnosis not present

## 2015-05-12 DIAGNOSIS — R339 Retention of urine, unspecified: Secondary | ICD-10-CM | POA: Diagnosis not present

## 2015-05-17 DIAGNOSIS — Z466 Encounter for fitting and adjustment of urinary device: Secondary | ICD-10-CM | POA: Diagnosis not present

## 2015-05-17 DIAGNOSIS — R339 Retention of urine, unspecified: Secondary | ICD-10-CM | POA: Diagnosis not present

## 2015-05-17 DIAGNOSIS — N390 Urinary tract infection, site not specified: Secondary | ICD-10-CM | POA: Diagnosis not present

## 2015-05-17 DIAGNOSIS — M150 Primary generalized (osteo)arthritis: Secondary | ICD-10-CM | POA: Diagnosis not present

## 2015-05-17 DIAGNOSIS — I509 Heart failure, unspecified: Secondary | ICD-10-CM | POA: Diagnosis not present

## 2015-05-17 DIAGNOSIS — I10 Essential (primary) hypertension: Secondary | ICD-10-CM | POA: Diagnosis not present

## 2015-05-18 DIAGNOSIS — I509 Heart failure, unspecified: Secondary | ICD-10-CM | POA: Diagnosis not present

## 2015-05-18 DIAGNOSIS — R2689 Other abnormalities of gait and mobility: Secondary | ICD-10-CM | POA: Diagnosis not present

## 2015-05-18 DIAGNOSIS — R339 Retention of urine, unspecified: Secondary | ICD-10-CM | POA: Diagnosis not present

## 2015-05-18 DIAGNOSIS — M150 Primary generalized (osteo)arthritis: Secondary | ICD-10-CM | POA: Diagnosis not present

## 2015-05-18 DIAGNOSIS — Z466 Encounter for fitting and adjustment of urinary device: Secondary | ICD-10-CM | POA: Diagnosis not present

## 2015-05-20 DIAGNOSIS — M150 Primary generalized (osteo)arthritis: Secondary | ICD-10-CM | POA: Diagnosis not present

## 2015-05-20 DIAGNOSIS — Z466 Encounter for fitting and adjustment of urinary device: Secondary | ICD-10-CM | POA: Diagnosis not present

## 2015-05-20 DIAGNOSIS — I509 Heart failure, unspecified: Secondary | ICD-10-CM | POA: Diagnosis not present

## 2015-05-20 DIAGNOSIS — R2689 Other abnormalities of gait and mobility: Secondary | ICD-10-CM | POA: Diagnosis not present

## 2015-05-20 DIAGNOSIS — R339 Retention of urine, unspecified: Secondary | ICD-10-CM | POA: Diagnosis not present

## 2015-05-25 DIAGNOSIS — R339 Retention of urine, unspecified: Secondary | ICD-10-CM | POA: Diagnosis not present

## 2015-05-25 DIAGNOSIS — Z466 Encounter for fitting and adjustment of urinary device: Secondary | ICD-10-CM | POA: Diagnosis not present

## 2015-05-25 DIAGNOSIS — M150 Primary generalized (osteo)arthritis: Secondary | ICD-10-CM | POA: Diagnosis not present

## 2015-05-25 DIAGNOSIS — R2689 Other abnormalities of gait and mobility: Secondary | ICD-10-CM | POA: Diagnosis not present

## 2015-05-25 DIAGNOSIS — I509 Heart failure, unspecified: Secondary | ICD-10-CM | POA: Diagnosis not present

## 2015-05-26 DIAGNOSIS — R2689 Other abnormalities of gait and mobility: Secondary | ICD-10-CM | POA: Diagnosis not present

## 2015-05-26 DIAGNOSIS — I509 Heart failure, unspecified: Secondary | ICD-10-CM | POA: Diagnosis not present

## 2015-05-26 DIAGNOSIS — M150 Primary generalized (osteo)arthritis: Secondary | ICD-10-CM | POA: Diagnosis not present

## 2015-05-26 DIAGNOSIS — R339 Retention of urine, unspecified: Secondary | ICD-10-CM | POA: Diagnosis not present

## 2015-05-26 DIAGNOSIS — Z466 Encounter for fitting and adjustment of urinary device: Secondary | ICD-10-CM | POA: Diagnosis not present

## 2015-05-29 DIAGNOSIS — S129XXA Fracture of neck, unspecified, initial encounter: Secondary | ICD-10-CM | POA: Diagnosis not present

## 2015-05-31 DIAGNOSIS — R339 Retention of urine, unspecified: Secondary | ICD-10-CM | POA: Diagnosis not present

## 2015-05-31 DIAGNOSIS — Z466 Encounter for fitting and adjustment of urinary device: Secondary | ICD-10-CM | POA: Diagnosis not present

## 2015-05-31 DIAGNOSIS — R2689 Other abnormalities of gait and mobility: Secondary | ICD-10-CM | POA: Diagnosis not present

## 2015-05-31 DIAGNOSIS — M150 Primary generalized (osteo)arthritis: Secondary | ICD-10-CM | POA: Diagnosis not present

## 2015-05-31 DIAGNOSIS — I509 Heart failure, unspecified: Secondary | ICD-10-CM | POA: Diagnosis not present

## 2015-06-02 ENCOUNTER — Telehealth: Payer: Self-pay | Admitting: Family Medicine

## 2015-06-02 DIAGNOSIS — R339 Retention of urine, unspecified: Secondary | ICD-10-CM | POA: Diagnosis not present

## 2015-06-02 DIAGNOSIS — I509 Heart failure, unspecified: Secondary | ICD-10-CM | POA: Diagnosis not present

## 2015-06-02 DIAGNOSIS — Z466 Encounter for fitting and adjustment of urinary device: Secondary | ICD-10-CM | POA: Diagnosis not present

## 2015-06-02 DIAGNOSIS — R2689 Other abnormalities of gait and mobility: Secondary | ICD-10-CM | POA: Diagnosis not present

## 2015-06-02 DIAGNOSIS — M150 Primary generalized (osteo)arthritis: Secondary | ICD-10-CM | POA: Diagnosis not present

## 2015-06-02 NOTE — Telephone Encounter (Signed)
OK 

## 2015-06-02 NOTE — Telephone Encounter (Signed)
[name] needs verbal order to charge catheter 1 wk early pt daughter is requesting due to she thinks her mother has kidney infection.

## 2015-06-03 DIAGNOSIS — M150 Primary generalized (osteo)arthritis: Secondary | ICD-10-CM | POA: Diagnosis not present

## 2015-06-03 DIAGNOSIS — N390 Urinary tract infection, site not specified: Secondary | ICD-10-CM | POA: Diagnosis not present

## 2015-06-03 DIAGNOSIS — R2689 Other abnormalities of gait and mobility: Secondary | ICD-10-CM | POA: Diagnosis not present

## 2015-06-03 DIAGNOSIS — I509 Heart failure, unspecified: Secondary | ICD-10-CM | POA: Diagnosis not present

## 2015-06-03 DIAGNOSIS — R339 Retention of urine, unspecified: Secondary | ICD-10-CM | POA: Diagnosis not present

## 2015-06-03 DIAGNOSIS — Z466 Encounter for fitting and adjustment of urinary device: Secondary | ICD-10-CM | POA: Diagnosis not present

## 2015-06-03 NOTE — Telephone Encounter (Signed)
Left a detailed message with the verbal order per Dr Elease Hashimoto at the cell number of Juliann Pulse.

## 2015-06-05 DIAGNOSIS — R339 Retention of urine, unspecified: Secondary | ICD-10-CM | POA: Diagnosis not present

## 2015-06-05 DIAGNOSIS — R2689 Other abnormalities of gait and mobility: Secondary | ICD-10-CM | POA: Diagnosis not present

## 2015-06-05 DIAGNOSIS — M150 Primary generalized (osteo)arthritis: Secondary | ICD-10-CM | POA: Diagnosis not present

## 2015-06-05 DIAGNOSIS — I509 Heart failure, unspecified: Secondary | ICD-10-CM | POA: Diagnosis not present

## 2015-06-05 DIAGNOSIS — Z466 Encounter for fitting and adjustment of urinary device: Secondary | ICD-10-CM | POA: Diagnosis not present

## 2015-06-06 ENCOUNTER — Telehealth: Payer: Self-pay | Admitting: Family Medicine

## 2015-06-06 NOTE — Telephone Encounter (Signed)
Her situation is very challenging because she has hx of frequent UTI with resistant pathogens AND is very likely colononized.  If she has fever would repeat urine cx.

## 2015-06-06 NOTE — Telephone Encounter (Signed)
[facility] are sending over a fax with the results.

## 2015-06-06 NOTE — Telephone Encounter (Signed)
Patient's daughter states she knows patient has a UTI.  She experiencing hallucinations and crying.  She would an order sent to [facility] for an antibiotic.

## 2015-06-14 DIAGNOSIS — R2689 Other abnormalities of gait and mobility: Secondary | ICD-10-CM | POA: Diagnosis not present

## 2015-06-14 DIAGNOSIS — Z466 Encounter for fitting and adjustment of urinary device: Secondary | ICD-10-CM | POA: Diagnosis not present

## 2015-06-14 DIAGNOSIS — M150 Primary generalized (osteo)arthritis: Secondary | ICD-10-CM | POA: Diagnosis not present

## 2015-06-14 DIAGNOSIS — R339 Retention of urine, unspecified: Secondary | ICD-10-CM | POA: Diagnosis not present

## 2015-06-14 DIAGNOSIS — I509 Heart failure, unspecified: Secondary | ICD-10-CM | POA: Diagnosis not present

## 2015-06-29 DIAGNOSIS — S129XXA Fracture of neck, unspecified, initial encounter: Secondary | ICD-10-CM | POA: Diagnosis not present

## 2015-06-30 DIAGNOSIS — R2689 Other abnormalities of gait and mobility: Secondary | ICD-10-CM | POA: Diagnosis not present

## 2015-06-30 DIAGNOSIS — M150 Primary generalized (osteo)arthritis: Secondary | ICD-10-CM | POA: Diagnosis not present

## 2015-06-30 DIAGNOSIS — I509 Heart failure, unspecified: Secondary | ICD-10-CM | POA: Diagnosis not present

## 2015-06-30 DIAGNOSIS — Z466 Encounter for fitting and adjustment of urinary device: Secondary | ICD-10-CM | POA: Diagnosis not present

## 2015-06-30 DIAGNOSIS — R339 Retention of urine, unspecified: Secondary | ICD-10-CM | POA: Diagnosis not present

## 2015-07-01 DIAGNOSIS — M150 Primary generalized (osteo)arthritis: Secondary | ICD-10-CM | POA: Diagnosis not present

## 2015-07-01 DIAGNOSIS — Z466 Encounter for fitting and adjustment of urinary device: Secondary | ICD-10-CM | POA: Diagnosis not present

## 2015-07-01 DIAGNOSIS — I509 Heart failure, unspecified: Secondary | ICD-10-CM | POA: Diagnosis not present

## 2015-07-01 DIAGNOSIS — R2689 Other abnormalities of gait and mobility: Secondary | ICD-10-CM | POA: Diagnosis not present

## 2015-07-01 DIAGNOSIS — R339 Retention of urine, unspecified: Secondary | ICD-10-CM | POA: Diagnosis not present

## 2015-07-03 DIAGNOSIS — N23 Unspecified renal colic: Secondary | ICD-10-CM | POA: Diagnosis not present

## 2015-07-03 DIAGNOSIS — F0390 Unspecified dementia without behavioral disturbance: Secondary | ICD-10-CM | POA: Diagnosis not present

## 2015-07-03 DIAGNOSIS — M75121 Complete rotator cuff tear or rupture of right shoulder, not specified as traumatic: Secondary | ICD-10-CM | POA: Diagnosis not present

## 2015-07-03 DIAGNOSIS — M1990 Unspecified osteoarthritis, unspecified site: Secondary | ICD-10-CM | POA: Diagnosis not present

## 2015-07-03 DIAGNOSIS — B372 Candidiasis of skin and nail: Secondary | ICD-10-CM | POA: Diagnosis not present

## 2015-07-03 DIAGNOSIS — L22 Diaper dermatitis: Secondary | ICD-10-CM | POA: Diagnosis not present

## 2015-07-03 DIAGNOSIS — S46001A Unspecified injury of muscle(s) and tendon(s) of the rotator cuff of right shoulder, initial encounter: Secondary | ICD-10-CM | POA: Diagnosis not present

## 2015-07-03 DIAGNOSIS — Z859 Personal history of malignant neoplasm, unspecified: Secondary | ICD-10-CM | POA: Diagnosis not present

## 2015-07-03 DIAGNOSIS — Z8673 Personal history of transient ischemic attack (TIA), and cerebral infarction without residual deficits: Secondary | ICD-10-CM | POA: Diagnosis not present

## 2015-07-03 DIAGNOSIS — M79601 Pain in right arm: Secondary | ICD-10-CM | POA: Diagnosis not present

## 2015-07-03 DIAGNOSIS — S46011A Strain of muscle(s) and tendon(s) of the rotator cuff of right shoulder, initial encounter: Secondary | ICD-10-CM | POA: Diagnosis not present

## 2015-07-03 DIAGNOSIS — E079 Disorder of thyroid, unspecified: Secondary | ICD-10-CM | POA: Diagnosis not present

## 2015-07-03 DIAGNOSIS — N390 Urinary tract infection, site not specified: Secondary | ICD-10-CM | POA: Diagnosis not present

## 2015-07-04 DIAGNOSIS — R2689 Other abnormalities of gait and mobility: Secondary | ICD-10-CM | POA: Diagnosis not present

## 2015-07-04 DIAGNOSIS — I509 Heart failure, unspecified: Secondary | ICD-10-CM | POA: Diagnosis not present

## 2015-07-04 DIAGNOSIS — R339 Retention of urine, unspecified: Secondary | ICD-10-CM | POA: Diagnosis not present

## 2015-07-04 DIAGNOSIS — M150 Primary generalized (osteo)arthritis: Secondary | ICD-10-CM | POA: Diagnosis not present

## 2015-07-04 DIAGNOSIS — Z466 Encounter for fitting and adjustment of urinary device: Secondary | ICD-10-CM | POA: Diagnosis not present

## 2015-07-11 DIAGNOSIS — R2689 Other abnormalities of gait and mobility: Secondary | ICD-10-CM | POA: Diagnosis not present

## 2015-07-11 DIAGNOSIS — R339 Retention of urine, unspecified: Secondary | ICD-10-CM | POA: Diagnosis not present

## 2015-07-11 DIAGNOSIS — M150 Primary generalized (osteo)arthritis: Secondary | ICD-10-CM | POA: Diagnosis not present

## 2015-07-11 DIAGNOSIS — Z466 Encounter for fitting and adjustment of urinary device: Secondary | ICD-10-CM | POA: Diagnosis not present

## 2015-07-11 DIAGNOSIS — I509 Heart failure, unspecified: Secondary | ICD-10-CM | POA: Diagnosis not present

## 2015-07-12 DIAGNOSIS — R2689 Other abnormalities of gait and mobility: Secondary | ICD-10-CM | POA: Diagnosis not present

## 2015-07-12 DIAGNOSIS — I509 Heart failure, unspecified: Secondary | ICD-10-CM | POA: Diagnosis not present

## 2015-07-12 DIAGNOSIS — M150 Primary generalized (osteo)arthritis: Secondary | ICD-10-CM | POA: Diagnosis not present

## 2015-07-12 DIAGNOSIS — R339 Retention of urine, unspecified: Secondary | ICD-10-CM | POA: Diagnosis not present

## 2015-07-12 DIAGNOSIS — Z466 Encounter for fitting and adjustment of urinary device: Secondary | ICD-10-CM | POA: Diagnosis not present

## 2015-07-13 DIAGNOSIS — Z466 Encounter for fitting and adjustment of urinary device: Secondary | ICD-10-CM | POA: Diagnosis not present

## 2015-07-13 DIAGNOSIS — R2689 Other abnormalities of gait and mobility: Secondary | ICD-10-CM | POA: Diagnosis not present

## 2015-07-13 DIAGNOSIS — M150 Primary generalized (osteo)arthritis: Secondary | ICD-10-CM | POA: Diagnosis not present

## 2015-07-13 DIAGNOSIS — R339 Retention of urine, unspecified: Secondary | ICD-10-CM | POA: Diagnosis not present

## 2015-07-13 DIAGNOSIS — I509 Heart failure, unspecified: Secondary | ICD-10-CM | POA: Diagnosis not present

## 2015-07-17 DIAGNOSIS — Z466 Encounter for fitting and adjustment of urinary device: Secondary | ICD-10-CM | POA: Diagnosis not present

## 2015-07-17 DIAGNOSIS — M150 Primary generalized (osteo)arthritis: Secondary | ICD-10-CM | POA: Diagnosis not present

## 2015-07-17 DIAGNOSIS — I509 Heart failure, unspecified: Secondary | ICD-10-CM | POA: Diagnosis not present

## 2015-07-17 DIAGNOSIS — R339 Retention of urine, unspecified: Secondary | ICD-10-CM | POA: Diagnosis not present

## 2015-07-17 DIAGNOSIS — R2689 Other abnormalities of gait and mobility: Secondary | ICD-10-CM | POA: Diagnosis not present

## 2015-07-18 DIAGNOSIS — I509 Heart failure, unspecified: Secondary | ICD-10-CM | POA: Diagnosis not present

## 2015-07-18 DIAGNOSIS — R2689 Other abnormalities of gait and mobility: Secondary | ICD-10-CM | POA: Diagnosis not present

## 2015-07-18 DIAGNOSIS — Z466 Encounter for fitting and adjustment of urinary device: Secondary | ICD-10-CM | POA: Diagnosis not present

## 2015-07-18 DIAGNOSIS — M150 Primary generalized (osteo)arthritis: Secondary | ICD-10-CM | POA: Diagnosis not present

## 2015-07-18 DIAGNOSIS — R339 Retention of urine, unspecified: Secondary | ICD-10-CM | POA: Diagnosis not present

## 2015-07-21 DIAGNOSIS — I509 Heart failure, unspecified: Secondary | ICD-10-CM | POA: Diagnosis not present

## 2015-07-21 DIAGNOSIS — R339 Retention of urine, unspecified: Secondary | ICD-10-CM | POA: Diagnosis not present

## 2015-07-21 DIAGNOSIS — Z466 Encounter for fitting and adjustment of urinary device: Secondary | ICD-10-CM | POA: Diagnosis not present

## 2015-07-21 DIAGNOSIS — R2689 Other abnormalities of gait and mobility: Secondary | ICD-10-CM | POA: Diagnosis not present

## 2015-07-21 DIAGNOSIS — M150 Primary generalized (osteo)arthritis: Secondary | ICD-10-CM | POA: Diagnosis not present

## 2015-07-27 DIAGNOSIS — R2689 Other abnormalities of gait and mobility: Secondary | ICD-10-CM | POA: Diagnosis not present

## 2015-07-27 DIAGNOSIS — M150 Primary generalized (osteo)arthritis: Secondary | ICD-10-CM | POA: Diagnosis not present

## 2015-07-27 DIAGNOSIS — N390 Urinary tract infection, site not specified: Secondary | ICD-10-CM | POA: Diagnosis not present

## 2015-07-27 DIAGNOSIS — R339 Retention of urine, unspecified: Secondary | ICD-10-CM | POA: Diagnosis not present

## 2015-07-27 DIAGNOSIS — Z466 Encounter for fitting and adjustment of urinary device: Secondary | ICD-10-CM | POA: Diagnosis not present

## 2015-07-27 DIAGNOSIS — I509 Heart failure, unspecified: Secondary | ICD-10-CM | POA: Diagnosis not present

## 2015-07-28 DIAGNOSIS — R339 Retention of urine, unspecified: Secondary | ICD-10-CM | POA: Diagnosis not present

## 2015-07-28 DIAGNOSIS — Z466 Encounter for fitting and adjustment of urinary device: Secondary | ICD-10-CM | POA: Diagnosis not present

## 2015-07-28 DIAGNOSIS — I509 Heart failure, unspecified: Secondary | ICD-10-CM | POA: Diagnosis not present

## 2015-07-28 DIAGNOSIS — M150 Primary generalized (osteo)arthritis: Secondary | ICD-10-CM | POA: Diagnosis not present

## 2015-07-28 DIAGNOSIS — R2689 Other abnormalities of gait and mobility: Secondary | ICD-10-CM | POA: Diagnosis not present

## 2015-07-29 DIAGNOSIS — M150 Primary generalized (osteo)arthritis: Secondary | ICD-10-CM | POA: Diagnosis not present

## 2015-07-29 DIAGNOSIS — I509 Heart failure, unspecified: Secondary | ICD-10-CM | POA: Diagnosis not present

## 2015-07-29 DIAGNOSIS — Z466 Encounter for fitting and adjustment of urinary device: Secondary | ICD-10-CM | POA: Diagnosis not present

## 2015-07-29 DIAGNOSIS — R339 Retention of urine, unspecified: Secondary | ICD-10-CM | POA: Diagnosis not present

## 2015-07-29 DIAGNOSIS — R2689 Other abnormalities of gait and mobility: Secondary | ICD-10-CM | POA: Diagnosis not present

## 2015-07-30 DIAGNOSIS — S129XXA Fracture of neck, unspecified, initial encounter: Secondary | ICD-10-CM | POA: Diagnosis not present

## 2015-08-01 ENCOUNTER — Telehealth: Payer: Self-pay | Admitting: Family Medicine

## 2015-08-01 NOTE — Telephone Encounter (Signed)
Pt daughter call to ask if the fl2 and other forms were filled out and faxed over to [employer]. Daughter would like a call back

## 2015-08-03 DIAGNOSIS — Z466 Encounter for fitting and adjustment of urinary device: Secondary | ICD-10-CM | POA: Diagnosis not present

## 2015-08-03 DIAGNOSIS — I509 Heart failure, unspecified: Secondary | ICD-10-CM | POA: Diagnosis not present

## 2015-08-03 DIAGNOSIS — R339 Retention of urine, unspecified: Secondary | ICD-10-CM | POA: Diagnosis not present

## 2015-08-03 DIAGNOSIS — M150 Primary generalized (osteo)arthritis: Secondary | ICD-10-CM | POA: Diagnosis not present

## 2015-08-03 DIAGNOSIS — R2689 Other abnormalities of gait and mobility: Secondary | ICD-10-CM | POA: Diagnosis not present

## 2015-08-03 NOTE — Telephone Encounter (Signed)
LM on personalized VM the forms were faxed 9/13.

## 2015-08-03 NOTE — Telephone Encounter (Signed)
Can you please call the daughter and inform her that the form has been faxed over on 08/02/15

## 2015-08-04 DIAGNOSIS — R2689 Other abnormalities of gait and mobility: Secondary | ICD-10-CM | POA: Diagnosis not present

## 2015-08-04 DIAGNOSIS — Z466 Encounter for fitting and adjustment of urinary device: Secondary | ICD-10-CM | POA: Diagnosis not present

## 2015-08-04 DIAGNOSIS — R339 Retention of urine, unspecified: Secondary | ICD-10-CM | POA: Diagnosis not present

## 2015-08-04 DIAGNOSIS — M150 Primary generalized (osteo)arthritis: Secondary | ICD-10-CM | POA: Diagnosis not present

## 2015-08-04 DIAGNOSIS — I509 Heart failure, unspecified: Secondary | ICD-10-CM | POA: Diagnosis not present

## 2015-08-12 DIAGNOSIS — Z466 Encounter for fitting and adjustment of urinary device: Secondary | ICD-10-CM | POA: Diagnosis not present

## 2015-08-12 DIAGNOSIS — M150 Primary generalized (osteo)arthritis: Secondary | ICD-10-CM | POA: Diagnosis not present

## 2015-08-12 DIAGNOSIS — R339 Retention of urine, unspecified: Secondary | ICD-10-CM | POA: Diagnosis not present

## 2015-08-12 DIAGNOSIS — R2689 Other abnormalities of gait and mobility: Secondary | ICD-10-CM | POA: Diagnosis not present

## 2015-08-12 DIAGNOSIS — I509 Heart failure, unspecified: Secondary | ICD-10-CM | POA: Diagnosis not present

## 2015-08-16 ENCOUNTER — Telehealth: Payer: Self-pay | Admitting: Family Medicine

## 2015-08-16 NOTE — Telephone Encounter (Signed)
Pt daughter call to say [name] is waiting on paperwork to be faxed back concerning her medication Daughter call to see if Dr Elease Hashimoto has received this paperwork

## 2015-08-17 NOTE — Telephone Encounter (Signed)
[facility] - Client TELEPHONE ADVICE RECORD [facility] Patient Name: MISTIE ADNEY Gender: Female DOB: 22-Oct-1931 Age: 81 Y 9 M 14 D Return Phone Number: Address: City/State/Zip: [place] [employer] [facility] - Client Client Site [facility] - Night Physician Carolann Littler Contact Type Call Call Type Page Only Caller Name [place] w/ [place] Is this call to report lab results? No Return Phone Number Please choose phone number Initial Comment Caller states Pt has blood in her stool but no records of hemoroids . CB# [phone]  Nurse Assessment Guidelines Guideline Title Affirmed Question Affirmed Notes Nurse Date/Time Eilene Ghazi Time) Disp. Time Eilene Ghazi Time) Disposition Final User 08/16/2015 6:51:42 PM Send to [place], Bettey Mare 08/16/2015 7:05:17 PM Paged On Call to Other Provider Stephens November 08/16/2015 7:05:56 PM Paged On Call to Other Provider Yes Stephens November Reason: Paged on-call to [facility] After Care Instructions Given Call Event Type User Date / Time Description  Comments User: Stephens November Date/Time Eilene Ghazi Time): 08/16/2015 7:05:08 PM contacted on-call - phone number given was a non-working number. I looked up [employer] phone number online, called, but no answer - Gave on-call the phone number. Paging DoctorName Phone DateTime Result/Outcome Message Type Notes Unice Cobble [phone] 08/16/2015 7:05:17 PM Paged On Call to Other Provider Doctor Paged Unice Cobble 08/16/2015 7:05:21 PM Spoke with On Call - General Message Result

## 2015-08-17 NOTE — Telephone Encounter (Signed)
Fax already sent

## 2015-08-17 NOTE — Telephone Encounter (Addendum)
[facility] is waiting back on paperwork faxed Monday.   1. They need pt's allergies, 2.  Also he has 3 meds he does not have orders for, two of them are creams,  Triamcinolone, clotrimazole and  Biotin.   If pt is to have the creams, no instructions on where they need to be applied to.  3.  In addition trying to get orders for home health catheter care, Physical Therapy and Occupational therapy.  4. Also would like something for her heels. She has places on her heels. Pt complaining about them hurting.

## 2015-08-18 ENCOUNTER — Telehealth: Payer: Self-pay | Admitting: Family Medicine

## 2015-08-18 DIAGNOSIS — Z466 Encounter for fitting and adjustment of urinary device: Secondary | ICD-10-CM | POA: Diagnosis not present

## 2015-08-18 DIAGNOSIS — M150 Primary generalized (osteo)arthritis: Secondary | ICD-10-CM | POA: Diagnosis not present

## 2015-08-18 DIAGNOSIS — I509 Heart failure, unspecified: Secondary | ICD-10-CM | POA: Diagnosis not present

## 2015-08-18 DIAGNOSIS — R339 Retention of urine, unspecified: Secondary | ICD-10-CM | POA: Diagnosis not present

## 2015-08-18 DIAGNOSIS — R2689 Other abnormalities of gait and mobility: Secondary | ICD-10-CM | POA: Diagnosis not present

## 2015-08-18 NOTE — Telephone Encounter (Signed)
Juliann Pulse rn from [facility] would like  Order to use barrier cream on her buttock also order to use monistat for her vaginal area  Due to redness and soreness. Pt also needs order for catheter. Pt has a catheter. Pt is at [facility] now and transferred  from [facility]. Fax # [phone] attn lacy

## 2015-08-18 NOTE — Telephone Encounter (Addendum)
Was the fax sent addressing the 3 prescriptions they do not have orders for? And instructions They have not received.  They did not receive the orders for home health catheter care They did not receive anything except allergies.   Pt also needs something for hemorrhoids.

## 2015-08-18 NOTE — Telephone Encounter (Signed)
Please see note.

## 2015-08-18 NOTE — Telephone Encounter (Signed)
Patient needs physician at [facility] to follow-if possible secondary to complexity and level of care needs OK to order:  Foley catheter Monistat to vaginal area bid prn May use Desitin or other barrier cream to buttock prn.

## 2015-08-19 NOTE — Telephone Encounter (Signed)
Verbal orders given to [facility].  She also requested a hard copy faxed to [facility]

## 2015-08-22 ENCOUNTER — Telehealth: Payer: Self-pay | Admitting: Family Medicine

## 2015-08-22 DIAGNOSIS — I509 Heart failure, unspecified: Secondary | ICD-10-CM | POA: Diagnosis not present

## 2015-08-22 DIAGNOSIS — R2689 Other abnormalities of gait and mobility: Secondary | ICD-10-CM | POA: Diagnosis not present

## 2015-08-22 DIAGNOSIS — M150 Primary generalized (osteo)arthritis: Secondary | ICD-10-CM | POA: Diagnosis not present

## 2015-08-22 DIAGNOSIS — R339 Retention of urine, unspecified: Secondary | ICD-10-CM | POA: Diagnosis not present

## 2015-08-22 DIAGNOSIS — Z466 Encounter for fitting and adjustment of urinary device: Secondary | ICD-10-CM | POA: Diagnosis not present

## 2015-08-22 NOTE — Telephone Encounter (Signed)
I spoke with Juliann Pulse Friday and explained that the orders should come from the physician where the patient resides.  Would you like for me to speak with the daughter? Verbal orders okay?

## 2015-08-22 NOTE — Telephone Encounter (Signed)
Daughter is requesting catheter change due to leakage  Also pt slurring words and wants her urine checked for poss uti [name] Pulse at [facility] needs order. Verbal order ok.  [phone]

## 2015-08-22 NOTE — Telephone Encounter (Signed)
I suggest:  #1-highly recommend if they have a physician there at that facility to follow her with her complexity of issues #2-OK to give verbal to change catheter. #3- she has foley so is very likely chronically colonized, so urine cultures are not very helpful unless she has symptoms such as fever.

## 2015-08-22 NOTE — Telephone Encounter (Signed)
Left message on machine for Kathy to return our call. 

## 2015-08-23 NOTE — Telephone Encounter (Signed)
Gave verbal orders to [place].  Left message on machine for daughter [name] to return our call.

## 2015-08-25 NOTE — Telephone Encounter (Signed)
I spoke with daughter [name] and she states that [name] will continue to need Dr Elease Hashimoto  To continue to give orders until all orders are all given. Left message on machine at [facility] [phone]

## 2015-08-25 NOTE — Telephone Encounter (Signed)
Spoke with daughter [name] and she states that I should talk with daughter Lequita Asal [phone].  Both daughter are on the [place].  Left message on machine for daughter to return our call.

## 2015-08-25 NOTE — Telephone Encounter (Signed)
Left message on machine for daughter to return our call 

## 2015-08-26 NOTE — Telephone Encounter (Signed)
I have answered and signed all papers that I received regarding [name].  Regarding her urine, she is chronically colonized and would not send urine culture unless she has fever or other indicators of infection such as extreme lethargy.   We need to have them clarify which creams they are referring to.

## 2015-08-26 NOTE — Telephone Encounter (Signed)
Tiney Rouge at [facility] calling back to advise he never received order for a urinary tract infection check.  Also they never received the orders for the 2 creams pt has and the biotin. Asencion Partridge states that information was  Faxed. pls advise

## 2015-08-29 DIAGNOSIS — S129XXA Fracture of neck, unspecified, initial encounter: Secondary | ICD-10-CM | POA: Diagnosis not present

## 2015-08-30 NOTE — Telephone Encounter (Signed)
Patient is using triamcinolone cream and clotrimazole cream. In addition, Biotin. They would like orders to continue using medications. In terms of UA, Eon at [facility] states home health nurse was concerned of UTI due to patient's increase in confusion more then normal and sentences not making sense.   Addressed the concerns of transitioning care to the provider on site. [employer] [employer] is working on getting that paperwork together to transfer care so Dr. Elease Hashimoto will not have to continue addressing patient's issues. In mean time, they are requesting approval for above medications and UA.    [place]

## 2015-08-30 NOTE — Telephone Encounter (Signed)
OK to approve the above medications.  We have gone over this many times but she is CHRONICALLY COLONIZED and will always have positive urine cultures.  Unless she has any fever would not repeat her culture.

## 2015-08-31 NOTE — Telephone Encounter (Signed)
Attempted to contact [name] at [facility] and left voicemail for him to call office back

## 2015-09-01 ENCOUNTER — Telehealth: Payer: Self-pay | Admitting: Family Medicine

## 2015-09-01 NOTE — Telephone Encounter (Signed)
Lacie lpn [facility]  is returning  michelle call concerning this patient

## 2015-09-07 NOTE — Telephone Encounter (Signed)
Spoke with Lacie at facility whom works closely with [facility]. Made Lacie aware of verbal order for creams and Biotin. Lacie also confirms plans to fax forms to transition care to provider at facility for day to day orders. Awaiting fax.

## 2015-09-07 NOTE — Telephone Encounter (Signed)
Spoke with Lacie. Please reference other note in patient's chart for update.

## 2015-09-13 DIAGNOSIS — M1990 Unspecified osteoarthritis, unspecified site: Secondary | ICD-10-CM | POA: Diagnosis not present

## 2015-09-13 DIAGNOSIS — F0390 Unspecified dementia without behavioral disturbance: Secondary | ICD-10-CM | POA: Diagnosis not present

## 2015-09-13 DIAGNOSIS — E079 Disorder of thyroid, unspecified: Secondary | ICD-10-CM | POA: Diagnosis not present

## 2015-09-13 DIAGNOSIS — Z881 Allergy status to other antibiotic agents status: Secondary | ICD-10-CM | POA: Diagnosis not present

## 2015-09-13 DIAGNOSIS — N390 Urinary tract infection, site not specified: Secondary | ICD-10-CM | POA: Diagnosis not present

## 2015-09-13 DIAGNOSIS — R600 Localized edema: Secondary | ICD-10-CM | POA: Diagnosis not present

## 2015-09-13 DIAGNOSIS — Z8673 Personal history of transient ischemic attack (TIA), and cerebral infarction without residual deficits: Secondary | ICD-10-CM | POA: Diagnosis not present

## 2015-09-13 DIAGNOSIS — Z888 Allergy status to other drugs, medicaments and biological substances status: Secondary | ICD-10-CM | POA: Diagnosis not present

## 2015-09-13 DIAGNOSIS — Z882 Allergy status to sulfonamides status: Secondary | ICD-10-CM | POA: Diagnosis not present

## 2015-09-14 ENCOUNTER — Encounter: Payer: Self-pay | Admitting: Family Medicine

## 2015-09-14 DIAGNOSIS — G459 Transient cerebral ischemic attack, unspecified: Secondary | ICD-10-CM | POA: Diagnosis not present

## 2015-09-14 DIAGNOSIS — I509 Heart failure, unspecified: Secondary | ICD-10-CM | POA: Diagnosis not present

## 2015-09-14 DIAGNOSIS — R339 Retention of urine, unspecified: Secondary | ICD-10-CM | POA: Diagnosis not present

## 2015-09-14 DIAGNOSIS — E039 Hypothyroidism, unspecified: Secondary | ICD-10-CM | POA: Diagnosis not present

## 2015-09-14 DIAGNOSIS — M150 Primary generalized (osteo)arthritis: Secondary | ICD-10-CM | POA: Diagnosis not present

## 2015-09-14 DIAGNOSIS — Z466 Encounter for fitting and adjustment of urinary device: Secondary | ICD-10-CM | POA: Diagnosis not present

## 2015-09-14 DIAGNOSIS — R2689 Other abnormalities of gait and mobility: Secondary | ICD-10-CM | POA: Diagnosis not present

## 2015-09-14 DIAGNOSIS — F0280 Dementia in other diseases classified elsewhere without behavioral disturbance: Secondary | ICD-10-CM | POA: Diagnosis not present

## 2015-09-23 DIAGNOSIS — Z466 Encounter for fitting and adjustment of urinary device: Secondary | ICD-10-CM | POA: Diagnosis not present

## 2015-09-29 DIAGNOSIS — S129XXA Fracture of neck, unspecified, initial encounter: Secondary | ICD-10-CM | POA: Diagnosis not present

## 2015-09-29 DIAGNOSIS — R2689 Other abnormalities of gait and mobility: Secondary | ICD-10-CM | POA: Diagnosis not present

## 2015-09-29 DIAGNOSIS — N390 Urinary tract infection, site not specified: Secondary | ICD-10-CM | POA: Diagnosis not present

## 2015-09-29 DIAGNOSIS — F329 Major depressive disorder, single episode, unspecified: Secondary | ICD-10-CM | POA: Diagnosis not present

## 2015-09-29 DIAGNOSIS — Z8672 Personal history of thrombophlebitis: Secondary | ICD-10-CM | POA: Diagnosis not present

## 2015-09-29 DIAGNOSIS — F0280 Dementia in other diseases classified elsewhere without behavioral disturbance: Secondary | ICD-10-CM | POA: Diagnosis not present

## 2015-09-29 DIAGNOSIS — G214 Vascular parkinsonism: Secondary | ICD-10-CM | POA: Diagnosis not present

## 2015-09-29 DIAGNOSIS — R339 Retention of urine, unspecified: Secondary | ICD-10-CM | POA: Diagnosis not present

## 2015-09-29 DIAGNOSIS — Z466 Encounter for fitting and adjustment of urinary device: Secondary | ICD-10-CM | POA: Diagnosis not present

## 2015-09-29 DIAGNOSIS — Z7901 Long term (current) use of anticoagulants: Secondary | ICD-10-CM | POA: Diagnosis not present

## 2015-10-03 DIAGNOSIS — G214 Vascular parkinsonism: Secondary | ICD-10-CM | POA: Diagnosis not present

## 2015-10-03 DIAGNOSIS — Z7901 Long term (current) use of anticoagulants: Secondary | ICD-10-CM | POA: Diagnosis not present

## 2015-10-03 DIAGNOSIS — F329 Major depressive disorder, single episode, unspecified: Secondary | ICD-10-CM | POA: Diagnosis not present

## 2015-10-03 DIAGNOSIS — Z466 Encounter for fitting and adjustment of urinary device: Secondary | ICD-10-CM | POA: Diagnosis not present

## 2015-10-03 DIAGNOSIS — R2689 Other abnormalities of gait and mobility: Secondary | ICD-10-CM | POA: Diagnosis not present

## 2015-10-03 DIAGNOSIS — F0280 Dementia in other diseases classified elsewhere without behavioral disturbance: Secondary | ICD-10-CM | POA: Diagnosis not present

## 2015-10-03 DIAGNOSIS — Z8672 Personal history of thrombophlebitis: Secondary | ICD-10-CM | POA: Diagnosis not present

## 2015-10-03 DIAGNOSIS — R339 Retention of urine, unspecified: Secondary | ICD-10-CM | POA: Diagnosis not present

## 2015-10-03 DIAGNOSIS — N390 Urinary tract infection, site not specified: Secondary | ICD-10-CM | POA: Diagnosis not present

## 2015-10-04 DIAGNOSIS — R339 Retention of urine, unspecified: Secondary | ICD-10-CM | POA: Diagnosis not present

## 2015-10-04 DIAGNOSIS — G214 Vascular parkinsonism: Secondary | ICD-10-CM | POA: Diagnosis not present

## 2015-10-04 DIAGNOSIS — N390 Urinary tract infection, site not specified: Secondary | ICD-10-CM | POA: Diagnosis not present

## 2015-10-04 DIAGNOSIS — R2689 Other abnormalities of gait and mobility: Secondary | ICD-10-CM | POA: Diagnosis not present

## 2015-10-04 DIAGNOSIS — Z8672 Personal history of thrombophlebitis: Secondary | ICD-10-CM | POA: Diagnosis not present

## 2015-10-04 DIAGNOSIS — F0280 Dementia in other diseases classified elsewhere without behavioral disturbance: Secondary | ICD-10-CM | POA: Diagnosis not present

## 2015-10-04 DIAGNOSIS — F329 Major depressive disorder, single episode, unspecified: Secondary | ICD-10-CM | POA: Diagnosis not present

## 2015-10-04 DIAGNOSIS — Z466 Encounter for fitting and adjustment of urinary device: Secondary | ICD-10-CM | POA: Diagnosis not present

## 2015-10-04 DIAGNOSIS — Z7901 Long term (current) use of anticoagulants: Secondary | ICD-10-CM | POA: Diagnosis not present

## 2015-10-05 DIAGNOSIS — N390 Urinary tract infection, site not specified: Secondary | ICD-10-CM | POA: Diagnosis not present

## 2015-10-05 DIAGNOSIS — G214 Vascular parkinsonism: Secondary | ICD-10-CM | POA: Diagnosis not present

## 2015-10-05 DIAGNOSIS — Z466 Encounter for fitting and adjustment of urinary device: Secondary | ICD-10-CM | POA: Diagnosis not present

## 2015-10-05 DIAGNOSIS — R2689 Other abnormalities of gait and mobility: Secondary | ICD-10-CM | POA: Diagnosis not present

## 2015-10-05 DIAGNOSIS — F0280 Dementia in other diseases classified elsewhere without behavioral disturbance: Secondary | ICD-10-CM | POA: Diagnosis not present

## 2015-10-05 DIAGNOSIS — F329 Major depressive disorder, single episode, unspecified: Secondary | ICD-10-CM | POA: Diagnosis not present

## 2015-10-05 DIAGNOSIS — Z7901 Long term (current) use of anticoagulants: Secondary | ICD-10-CM | POA: Diagnosis not present

## 2015-10-05 DIAGNOSIS — R339 Retention of urine, unspecified: Secondary | ICD-10-CM | POA: Diagnosis not present

## 2015-10-05 DIAGNOSIS — Z8672 Personal history of thrombophlebitis: Secondary | ICD-10-CM | POA: Diagnosis not present

## 2015-10-06 DIAGNOSIS — Z7901 Long term (current) use of anticoagulants: Secondary | ICD-10-CM | POA: Diagnosis not present

## 2015-10-06 DIAGNOSIS — N390 Urinary tract infection, site not specified: Secondary | ICD-10-CM | POA: Diagnosis not present

## 2015-10-06 DIAGNOSIS — R339 Retention of urine, unspecified: Secondary | ICD-10-CM | POA: Diagnosis not present

## 2015-10-06 DIAGNOSIS — Z8672 Personal history of thrombophlebitis: Secondary | ICD-10-CM | POA: Diagnosis not present

## 2015-10-06 DIAGNOSIS — G459 Transient cerebral ischemic attack, unspecified: Secondary | ICD-10-CM | POA: Diagnosis not present

## 2015-10-06 DIAGNOSIS — Z466 Encounter for fitting and adjustment of urinary device: Secondary | ICD-10-CM | POA: Diagnosis not present

## 2015-10-06 DIAGNOSIS — F0280 Dementia in other diseases classified elsewhere without behavioral disturbance: Secondary | ICD-10-CM | POA: Diagnosis not present

## 2015-10-06 DIAGNOSIS — E039 Hypothyroidism, unspecified: Secondary | ICD-10-CM | POA: Diagnosis not present

## 2015-10-06 DIAGNOSIS — G214 Vascular parkinsonism: Secondary | ICD-10-CM | POA: Diagnosis not present

## 2015-10-06 DIAGNOSIS — R2689 Other abnormalities of gait and mobility: Secondary | ICD-10-CM | POA: Diagnosis not present

## 2015-10-06 DIAGNOSIS — K5901 Slow transit constipation: Secondary | ICD-10-CM | POA: Diagnosis not present

## 2015-10-06 DIAGNOSIS — F320 Major depressive disorder, single episode, mild: Secondary | ICD-10-CM | POA: Diagnosis not present

## 2015-10-06 DIAGNOSIS — F329 Major depressive disorder, single episode, unspecified: Secondary | ICD-10-CM | POA: Diagnosis not present

## 2015-10-07 DIAGNOSIS — F320 Major depressive disorder, single episode, mild: Secondary | ICD-10-CM | POA: Diagnosis not present

## 2015-10-07 DIAGNOSIS — F0280 Dementia in other diseases classified elsewhere without behavioral disturbance: Secondary | ICD-10-CM | POA: Diagnosis not present

## 2015-10-07 DIAGNOSIS — G20 Parkinson's disease: Secondary | ICD-10-CM | POA: Diagnosis not present

## 2015-10-10 DIAGNOSIS — R2689 Other abnormalities of gait and mobility: Secondary | ICD-10-CM | POA: Diagnosis not present

## 2015-10-10 DIAGNOSIS — Z8672 Personal history of thrombophlebitis: Secondary | ICD-10-CM | POA: Diagnosis not present

## 2015-10-10 DIAGNOSIS — Z7901 Long term (current) use of anticoagulants: Secondary | ICD-10-CM | POA: Diagnosis not present

## 2015-10-10 DIAGNOSIS — R339 Retention of urine, unspecified: Secondary | ICD-10-CM | POA: Diagnosis not present

## 2015-10-10 DIAGNOSIS — F0280 Dementia in other diseases classified elsewhere without behavioral disturbance: Secondary | ICD-10-CM | POA: Diagnosis not present

## 2015-10-10 DIAGNOSIS — N390 Urinary tract infection, site not specified: Secondary | ICD-10-CM | POA: Diagnosis not present

## 2015-10-10 DIAGNOSIS — F329 Major depressive disorder, single episode, unspecified: Secondary | ICD-10-CM | POA: Diagnosis not present

## 2015-10-10 DIAGNOSIS — Z466 Encounter for fitting and adjustment of urinary device: Secondary | ICD-10-CM | POA: Diagnosis not present

## 2015-10-10 DIAGNOSIS — G214 Vascular parkinsonism: Secondary | ICD-10-CM | POA: Diagnosis not present

## 2015-10-11 DIAGNOSIS — G214 Vascular parkinsonism: Secondary | ICD-10-CM | POA: Diagnosis not present

## 2015-10-11 DIAGNOSIS — R339 Retention of urine, unspecified: Secondary | ICD-10-CM | POA: Diagnosis not present

## 2015-10-11 DIAGNOSIS — R2689 Other abnormalities of gait and mobility: Secondary | ICD-10-CM | POA: Diagnosis not present

## 2015-10-11 DIAGNOSIS — Z466 Encounter for fitting and adjustment of urinary device: Secondary | ICD-10-CM | POA: Diagnosis not present

## 2015-10-11 DIAGNOSIS — Z7901 Long term (current) use of anticoagulants: Secondary | ICD-10-CM | POA: Diagnosis not present

## 2015-10-11 DIAGNOSIS — N390 Urinary tract infection, site not specified: Secondary | ICD-10-CM | POA: Diagnosis not present

## 2015-10-11 DIAGNOSIS — F0280 Dementia in other diseases classified elsewhere without behavioral disturbance: Secondary | ICD-10-CM | POA: Diagnosis not present

## 2015-10-11 DIAGNOSIS — F329 Major depressive disorder, single episode, unspecified: Secondary | ICD-10-CM | POA: Diagnosis not present

## 2015-10-11 DIAGNOSIS — Z8672 Personal history of thrombophlebitis: Secondary | ICD-10-CM | POA: Diagnosis not present

## 2015-10-12 DIAGNOSIS — R2689 Other abnormalities of gait and mobility: Secondary | ICD-10-CM | POA: Diagnosis not present

## 2015-10-12 DIAGNOSIS — Z8672 Personal history of thrombophlebitis: Secondary | ICD-10-CM | POA: Diagnosis not present

## 2015-10-12 DIAGNOSIS — Z466 Encounter for fitting and adjustment of urinary device: Secondary | ICD-10-CM | POA: Diagnosis not present

## 2015-10-12 DIAGNOSIS — F329 Major depressive disorder, single episode, unspecified: Secondary | ICD-10-CM | POA: Diagnosis not present

## 2015-10-12 DIAGNOSIS — Z7901 Long term (current) use of anticoagulants: Secondary | ICD-10-CM | POA: Diagnosis not present

## 2015-10-12 DIAGNOSIS — N390 Urinary tract infection, site not specified: Secondary | ICD-10-CM | POA: Diagnosis not present

## 2015-10-12 DIAGNOSIS — F0280 Dementia in other diseases classified elsewhere without behavioral disturbance: Secondary | ICD-10-CM | POA: Diagnosis not present

## 2015-10-12 DIAGNOSIS — R339 Retention of urine, unspecified: Secondary | ICD-10-CM | POA: Diagnosis not present

## 2015-10-12 DIAGNOSIS — G214 Vascular parkinsonism: Secondary | ICD-10-CM | POA: Diagnosis not present

## 2015-10-17 DIAGNOSIS — Z7901 Long term (current) use of anticoagulants: Secondary | ICD-10-CM | POA: Diagnosis not present

## 2015-10-17 DIAGNOSIS — F0280 Dementia in other diseases classified elsewhere without behavioral disturbance: Secondary | ICD-10-CM | POA: Diagnosis not present

## 2015-10-17 DIAGNOSIS — R339 Retention of urine, unspecified: Secondary | ICD-10-CM | POA: Diagnosis not present

## 2015-10-17 DIAGNOSIS — N390 Urinary tract infection, site not specified: Secondary | ICD-10-CM | POA: Diagnosis not present

## 2015-10-17 DIAGNOSIS — Z466 Encounter for fitting and adjustment of urinary device: Secondary | ICD-10-CM | POA: Diagnosis not present

## 2015-10-17 DIAGNOSIS — R2689 Other abnormalities of gait and mobility: Secondary | ICD-10-CM | POA: Diagnosis not present

## 2015-10-17 DIAGNOSIS — G214 Vascular parkinsonism: Secondary | ICD-10-CM | POA: Diagnosis not present

## 2015-10-17 DIAGNOSIS — Z8672 Personal history of thrombophlebitis: Secondary | ICD-10-CM | POA: Diagnosis not present

## 2015-10-17 DIAGNOSIS — F329 Major depressive disorder, single episode, unspecified: Secondary | ICD-10-CM | POA: Diagnosis not present

## 2015-10-18 DIAGNOSIS — G214 Vascular parkinsonism: Secondary | ICD-10-CM | POA: Diagnosis not present

## 2015-10-18 DIAGNOSIS — Z466 Encounter for fitting and adjustment of urinary device: Secondary | ICD-10-CM | POA: Diagnosis not present

## 2015-10-18 DIAGNOSIS — F329 Major depressive disorder, single episode, unspecified: Secondary | ICD-10-CM | POA: Diagnosis not present

## 2015-10-18 DIAGNOSIS — R339 Retention of urine, unspecified: Secondary | ICD-10-CM | POA: Diagnosis not present

## 2015-10-18 DIAGNOSIS — Z8672 Personal history of thrombophlebitis: Secondary | ICD-10-CM | POA: Diagnosis not present

## 2015-10-18 DIAGNOSIS — F0280 Dementia in other diseases classified elsewhere without behavioral disturbance: Secondary | ICD-10-CM | POA: Diagnosis not present

## 2015-10-18 DIAGNOSIS — Z7901 Long term (current) use of anticoagulants: Secondary | ICD-10-CM | POA: Diagnosis not present

## 2015-10-18 DIAGNOSIS — R2689 Other abnormalities of gait and mobility: Secondary | ICD-10-CM | POA: Diagnosis not present

## 2015-10-18 DIAGNOSIS — N390 Urinary tract infection, site not specified: Secondary | ICD-10-CM | POA: Diagnosis not present

## 2015-10-20 DIAGNOSIS — Z7901 Long term (current) use of anticoagulants: Secondary | ICD-10-CM | POA: Diagnosis not present

## 2015-10-20 DIAGNOSIS — R339 Retention of urine, unspecified: Secondary | ICD-10-CM | POA: Diagnosis not present

## 2015-10-20 DIAGNOSIS — N390 Urinary tract infection, site not specified: Secondary | ICD-10-CM | POA: Diagnosis not present

## 2015-10-20 DIAGNOSIS — G214 Vascular parkinsonism: Secondary | ICD-10-CM | POA: Diagnosis not present

## 2015-10-20 DIAGNOSIS — F0280 Dementia in other diseases classified elsewhere without behavioral disturbance: Secondary | ICD-10-CM | POA: Diagnosis not present

## 2015-10-20 DIAGNOSIS — R2689 Other abnormalities of gait and mobility: Secondary | ICD-10-CM | POA: Diagnosis not present

## 2015-10-20 DIAGNOSIS — F329 Major depressive disorder, single episode, unspecified: Secondary | ICD-10-CM | POA: Diagnosis not present

## 2015-10-20 DIAGNOSIS — Z466 Encounter for fitting and adjustment of urinary device: Secondary | ICD-10-CM | POA: Diagnosis not present

## 2015-10-20 DIAGNOSIS — Z8672 Personal history of thrombophlebitis: Secondary | ICD-10-CM | POA: Diagnosis not present

## 2015-10-24 DIAGNOSIS — R339 Retention of urine, unspecified: Secondary | ICD-10-CM | POA: Diagnosis not present

## 2015-10-24 DIAGNOSIS — G214 Vascular parkinsonism: Secondary | ICD-10-CM | POA: Diagnosis not present

## 2015-10-24 DIAGNOSIS — F0280 Dementia in other diseases classified elsewhere without behavioral disturbance: Secondary | ICD-10-CM | POA: Diagnosis not present

## 2015-10-24 DIAGNOSIS — Z7901 Long term (current) use of anticoagulants: Secondary | ICD-10-CM | POA: Diagnosis not present

## 2015-10-24 DIAGNOSIS — Z8672 Personal history of thrombophlebitis: Secondary | ICD-10-CM | POA: Diagnosis not present

## 2015-10-24 DIAGNOSIS — R2689 Other abnormalities of gait and mobility: Secondary | ICD-10-CM | POA: Diagnosis not present

## 2015-10-24 DIAGNOSIS — Z466 Encounter for fitting and adjustment of urinary device: Secondary | ICD-10-CM | POA: Diagnosis not present

## 2015-10-24 DIAGNOSIS — N390 Urinary tract infection, site not specified: Secondary | ICD-10-CM | POA: Diagnosis not present

## 2015-10-24 DIAGNOSIS — F329 Major depressive disorder, single episode, unspecified: Secondary | ICD-10-CM | POA: Diagnosis not present

## 2015-10-25 DIAGNOSIS — R339 Retention of urine, unspecified: Secondary | ICD-10-CM | POA: Diagnosis not present

## 2015-10-25 DIAGNOSIS — R2689 Other abnormalities of gait and mobility: Secondary | ICD-10-CM | POA: Diagnosis not present

## 2015-10-25 DIAGNOSIS — F329 Major depressive disorder, single episode, unspecified: Secondary | ICD-10-CM | POA: Diagnosis not present

## 2015-10-25 DIAGNOSIS — G214 Vascular parkinsonism: Secondary | ICD-10-CM | POA: Diagnosis not present

## 2015-10-25 DIAGNOSIS — N390 Urinary tract infection, site not specified: Secondary | ICD-10-CM | POA: Diagnosis not present

## 2015-10-25 DIAGNOSIS — Z7901 Long term (current) use of anticoagulants: Secondary | ICD-10-CM | POA: Diagnosis not present

## 2015-10-25 DIAGNOSIS — Z466 Encounter for fitting and adjustment of urinary device: Secondary | ICD-10-CM | POA: Diagnosis not present

## 2015-10-25 DIAGNOSIS — F0280 Dementia in other diseases classified elsewhere without behavioral disturbance: Secondary | ICD-10-CM | POA: Diagnosis not present

## 2015-10-25 DIAGNOSIS — Z8672 Personal history of thrombophlebitis: Secondary | ICD-10-CM | POA: Diagnosis not present

## 2015-10-26 DIAGNOSIS — R339 Retention of urine, unspecified: Secondary | ICD-10-CM | POA: Diagnosis not present

## 2015-10-26 DIAGNOSIS — F329 Major depressive disorder, single episode, unspecified: Secondary | ICD-10-CM | POA: Diagnosis not present

## 2015-10-26 DIAGNOSIS — F0280 Dementia in other diseases classified elsewhere without behavioral disturbance: Secondary | ICD-10-CM | POA: Diagnosis not present

## 2015-10-26 DIAGNOSIS — Z8672 Personal history of thrombophlebitis: Secondary | ICD-10-CM | POA: Diagnosis not present

## 2015-10-26 DIAGNOSIS — Z466 Encounter for fitting and adjustment of urinary device: Secondary | ICD-10-CM | POA: Diagnosis not present

## 2015-10-26 DIAGNOSIS — N390 Urinary tract infection, site not specified: Secondary | ICD-10-CM | POA: Diagnosis not present

## 2015-10-26 DIAGNOSIS — Z7901 Long term (current) use of anticoagulants: Secondary | ICD-10-CM | POA: Diagnosis not present

## 2015-10-26 DIAGNOSIS — R2689 Other abnormalities of gait and mobility: Secondary | ICD-10-CM | POA: Diagnosis not present

## 2015-10-26 DIAGNOSIS — G214 Vascular parkinsonism: Secondary | ICD-10-CM | POA: Diagnosis not present

## 2015-10-27 DIAGNOSIS — F0280 Dementia in other diseases classified elsewhere without behavioral disturbance: Secondary | ICD-10-CM | POA: Diagnosis not present

## 2015-10-27 DIAGNOSIS — Z8672 Personal history of thrombophlebitis: Secondary | ICD-10-CM | POA: Diagnosis not present

## 2015-10-27 DIAGNOSIS — Z7901 Long term (current) use of anticoagulants: Secondary | ICD-10-CM | POA: Diagnosis not present

## 2015-10-27 DIAGNOSIS — N390 Urinary tract infection, site not specified: Secondary | ICD-10-CM | POA: Diagnosis not present

## 2015-10-27 DIAGNOSIS — Z466 Encounter for fitting and adjustment of urinary device: Secondary | ICD-10-CM | POA: Diagnosis not present

## 2015-10-27 DIAGNOSIS — R2689 Other abnormalities of gait and mobility: Secondary | ICD-10-CM | POA: Diagnosis not present

## 2015-10-27 DIAGNOSIS — F329 Major depressive disorder, single episode, unspecified: Secondary | ICD-10-CM | POA: Diagnosis not present

## 2015-10-27 DIAGNOSIS — G214 Vascular parkinsonism: Secondary | ICD-10-CM | POA: Diagnosis not present

## 2015-10-27 DIAGNOSIS — R339 Retention of urine, unspecified: Secondary | ICD-10-CM | POA: Diagnosis not present

## 2015-10-31 DIAGNOSIS — Z7901 Long term (current) use of anticoagulants: Secondary | ICD-10-CM | POA: Diagnosis not present

## 2015-10-31 DIAGNOSIS — R339 Retention of urine, unspecified: Secondary | ICD-10-CM | POA: Diagnosis not present

## 2015-10-31 DIAGNOSIS — N390 Urinary tract infection, site not specified: Secondary | ICD-10-CM | POA: Diagnosis not present

## 2015-10-31 DIAGNOSIS — Z8672 Personal history of thrombophlebitis: Secondary | ICD-10-CM | POA: Diagnosis not present

## 2015-10-31 DIAGNOSIS — F329 Major depressive disorder, single episode, unspecified: Secondary | ICD-10-CM | POA: Diagnosis not present

## 2015-10-31 DIAGNOSIS — F0280 Dementia in other diseases classified elsewhere without behavioral disturbance: Secondary | ICD-10-CM | POA: Diagnosis not present

## 2015-10-31 DIAGNOSIS — Z87440 Personal history of urinary (tract) infections: Secondary | ICD-10-CM | POA: Diagnosis not present

## 2015-10-31 DIAGNOSIS — Z466 Encounter for fitting and adjustment of urinary device: Secondary | ICD-10-CM | POA: Diagnosis not present

## 2015-10-31 DIAGNOSIS — R2689 Other abnormalities of gait and mobility: Secondary | ICD-10-CM | POA: Diagnosis not present

## 2015-10-31 DIAGNOSIS — G214 Vascular parkinsonism: Secondary | ICD-10-CM | POA: Diagnosis not present

## 2015-11-02 DIAGNOSIS — F0280 Dementia in other diseases classified elsewhere without behavioral disturbance: Secondary | ICD-10-CM | POA: Diagnosis not present

## 2015-11-02 DIAGNOSIS — R2689 Other abnormalities of gait and mobility: Secondary | ICD-10-CM | POA: Diagnosis not present

## 2015-11-02 DIAGNOSIS — Z8672 Personal history of thrombophlebitis: Secondary | ICD-10-CM | POA: Diagnosis not present

## 2015-11-02 DIAGNOSIS — N390 Urinary tract infection, site not specified: Secondary | ICD-10-CM | POA: Diagnosis not present

## 2015-11-02 DIAGNOSIS — Z7901 Long term (current) use of anticoagulants: Secondary | ICD-10-CM | POA: Diagnosis not present

## 2015-11-02 DIAGNOSIS — F329 Major depressive disorder, single episode, unspecified: Secondary | ICD-10-CM | POA: Diagnosis not present

## 2015-11-02 DIAGNOSIS — G214 Vascular parkinsonism: Secondary | ICD-10-CM | POA: Diagnosis not present

## 2015-11-02 DIAGNOSIS — Z466 Encounter for fitting and adjustment of urinary device: Secondary | ICD-10-CM | POA: Diagnosis not present

## 2015-11-02 DIAGNOSIS — R339 Retention of urine, unspecified: Secondary | ICD-10-CM | POA: Diagnosis not present

## 2015-11-03 DIAGNOSIS — E039 Hypothyroidism, unspecified: Secondary | ICD-10-CM | POA: Diagnosis not present

## 2015-11-03 DIAGNOSIS — E782 Mixed hyperlipidemia: Secondary | ICD-10-CM | POA: Diagnosis not present

## 2015-11-03 DIAGNOSIS — I10 Essential (primary) hypertension: Secondary | ICD-10-CM | POA: Diagnosis not present

## 2015-11-03 DIAGNOSIS — E119 Type 2 diabetes mellitus without complications: Secondary | ICD-10-CM | POA: Diagnosis not present

## 2015-11-08 DIAGNOSIS — R2689 Other abnormalities of gait and mobility: Secondary | ICD-10-CM | POA: Diagnosis not present

## 2015-11-08 DIAGNOSIS — F0280 Dementia in other diseases classified elsewhere without behavioral disturbance: Secondary | ICD-10-CM | POA: Diagnosis not present

## 2015-11-08 DIAGNOSIS — F329 Major depressive disorder, single episode, unspecified: Secondary | ICD-10-CM | POA: Diagnosis not present

## 2015-11-08 DIAGNOSIS — R339 Retention of urine, unspecified: Secondary | ICD-10-CM | POA: Diagnosis not present

## 2015-11-08 DIAGNOSIS — G214 Vascular parkinsonism: Secondary | ICD-10-CM | POA: Diagnosis not present

## 2015-11-08 DIAGNOSIS — N390 Urinary tract infection, site not specified: Secondary | ICD-10-CM | POA: Diagnosis not present

## 2015-11-08 DIAGNOSIS — Z466 Encounter for fitting and adjustment of urinary device: Secondary | ICD-10-CM | POA: Diagnosis not present

## 2015-11-08 DIAGNOSIS — Z7901 Long term (current) use of anticoagulants: Secondary | ICD-10-CM | POA: Diagnosis not present

## 2015-11-08 DIAGNOSIS — Z8672 Personal history of thrombophlebitis: Secondary | ICD-10-CM | POA: Diagnosis not present

## 2015-11-09 DIAGNOSIS — Z7901 Long term (current) use of anticoagulants: Secondary | ICD-10-CM | POA: Diagnosis not present

## 2015-11-09 DIAGNOSIS — G214 Vascular parkinsonism: Secondary | ICD-10-CM | POA: Diagnosis not present

## 2015-11-09 DIAGNOSIS — R2689 Other abnormalities of gait and mobility: Secondary | ICD-10-CM | POA: Diagnosis not present

## 2015-11-09 DIAGNOSIS — R339 Retention of urine, unspecified: Secondary | ICD-10-CM | POA: Diagnosis not present

## 2015-11-09 DIAGNOSIS — F329 Major depressive disorder, single episode, unspecified: Secondary | ICD-10-CM | POA: Diagnosis not present

## 2015-11-09 DIAGNOSIS — F0280 Dementia in other diseases classified elsewhere without behavioral disturbance: Secondary | ICD-10-CM | POA: Diagnosis not present

## 2015-11-09 DIAGNOSIS — Z466 Encounter for fitting and adjustment of urinary device: Secondary | ICD-10-CM | POA: Diagnosis not present

## 2015-11-09 DIAGNOSIS — Z8672 Personal history of thrombophlebitis: Secondary | ICD-10-CM | POA: Diagnosis not present

## 2015-11-09 DIAGNOSIS — N390 Urinary tract infection, site not specified: Secondary | ICD-10-CM | POA: Diagnosis not present

## 2015-11-10 DIAGNOSIS — Z7901 Long term (current) use of anticoagulants: Secondary | ICD-10-CM | POA: Diagnosis not present

## 2015-11-10 DIAGNOSIS — R339 Retention of urine, unspecified: Secondary | ICD-10-CM | POA: Diagnosis not present

## 2015-11-10 DIAGNOSIS — F0280 Dementia in other diseases classified elsewhere without behavioral disturbance: Secondary | ICD-10-CM | POA: Diagnosis not present

## 2015-11-10 DIAGNOSIS — Z466 Encounter for fitting and adjustment of urinary device: Secondary | ICD-10-CM | POA: Diagnosis not present

## 2015-11-10 DIAGNOSIS — F329 Major depressive disorder, single episode, unspecified: Secondary | ICD-10-CM | POA: Diagnosis not present

## 2015-11-10 DIAGNOSIS — G214 Vascular parkinsonism: Secondary | ICD-10-CM | POA: Diagnosis not present

## 2015-11-10 DIAGNOSIS — R2689 Other abnormalities of gait and mobility: Secondary | ICD-10-CM | POA: Diagnosis not present

## 2015-11-10 DIAGNOSIS — N390 Urinary tract infection, site not specified: Secondary | ICD-10-CM | POA: Diagnosis not present

## 2015-11-10 DIAGNOSIS — Z8672 Personal history of thrombophlebitis: Secondary | ICD-10-CM | POA: Diagnosis not present

## 2015-11-11 DIAGNOSIS — G214 Vascular parkinsonism: Secondary | ICD-10-CM | POA: Diagnosis not present

## 2015-11-11 DIAGNOSIS — Z7901 Long term (current) use of anticoagulants: Secondary | ICD-10-CM | POA: Diagnosis not present

## 2015-11-11 DIAGNOSIS — F329 Major depressive disorder, single episode, unspecified: Secondary | ICD-10-CM | POA: Diagnosis not present

## 2015-11-11 DIAGNOSIS — Z8672 Personal history of thrombophlebitis: Secondary | ICD-10-CM | POA: Diagnosis not present

## 2015-11-11 DIAGNOSIS — N390 Urinary tract infection, site not specified: Secondary | ICD-10-CM | POA: Diagnosis not present

## 2015-11-11 DIAGNOSIS — R2689 Other abnormalities of gait and mobility: Secondary | ICD-10-CM | POA: Diagnosis not present

## 2015-11-11 DIAGNOSIS — R339 Retention of urine, unspecified: Secondary | ICD-10-CM | POA: Diagnosis not present

## 2015-11-11 DIAGNOSIS — Z466 Encounter for fitting and adjustment of urinary device: Secondary | ICD-10-CM | POA: Diagnosis not present

## 2015-11-11 DIAGNOSIS — F0280 Dementia in other diseases classified elsewhere without behavioral disturbance: Secondary | ICD-10-CM | POA: Diagnosis not present

## 2015-11-15 DIAGNOSIS — N390 Urinary tract infection, site not specified: Secondary | ICD-10-CM | POA: Diagnosis not present

## 2015-11-15 DIAGNOSIS — R339 Retention of urine, unspecified: Secondary | ICD-10-CM | POA: Diagnosis not present

## 2015-11-15 DIAGNOSIS — Z8672 Personal history of thrombophlebitis: Secondary | ICD-10-CM | POA: Diagnosis not present

## 2015-11-15 DIAGNOSIS — F0280 Dementia in other diseases classified elsewhere without behavioral disturbance: Secondary | ICD-10-CM | POA: Diagnosis not present

## 2015-11-15 DIAGNOSIS — Z466 Encounter for fitting and adjustment of urinary device: Secondary | ICD-10-CM | POA: Diagnosis not present

## 2015-11-15 DIAGNOSIS — G214 Vascular parkinsonism: Secondary | ICD-10-CM | POA: Diagnosis not present

## 2015-11-15 DIAGNOSIS — F329 Major depressive disorder, single episode, unspecified: Secondary | ICD-10-CM | POA: Diagnosis not present

## 2015-11-15 DIAGNOSIS — R2689 Other abnormalities of gait and mobility: Secondary | ICD-10-CM | POA: Diagnosis not present

## 2015-11-15 DIAGNOSIS — Z7901 Long term (current) use of anticoagulants: Secondary | ICD-10-CM | POA: Diagnosis not present

## 2015-11-16 DIAGNOSIS — K5901 Slow transit constipation: Secondary | ICD-10-CM | POA: Diagnosis not present

## 2015-11-16 DIAGNOSIS — R339 Retention of urine, unspecified: Secondary | ICD-10-CM | POA: Diagnosis not present

## 2015-11-16 DIAGNOSIS — G459 Transient cerebral ischemic attack, unspecified: Secondary | ICD-10-CM | POA: Diagnosis not present

## 2015-11-16 DIAGNOSIS — F0280 Dementia in other diseases classified elsewhere without behavioral disturbance: Secondary | ICD-10-CM | POA: Diagnosis not present

## 2015-11-16 DIAGNOSIS — F320 Major depressive disorder, single episode, mild: Secondary | ICD-10-CM | POA: Diagnosis not present

## 2015-11-16 DIAGNOSIS — E039 Hypothyroidism, unspecified: Secondary | ICD-10-CM | POA: Diagnosis not present

## 2015-11-17 DIAGNOSIS — R2689 Other abnormalities of gait and mobility: Secondary | ICD-10-CM | POA: Diagnosis not present

## 2015-11-17 DIAGNOSIS — Z8672 Personal history of thrombophlebitis: Secondary | ICD-10-CM | POA: Diagnosis not present

## 2015-11-17 DIAGNOSIS — Z466 Encounter for fitting and adjustment of urinary device: Secondary | ICD-10-CM | POA: Diagnosis not present

## 2015-11-17 DIAGNOSIS — F329 Major depressive disorder, single episode, unspecified: Secondary | ICD-10-CM | POA: Diagnosis not present

## 2015-11-17 DIAGNOSIS — G214 Vascular parkinsonism: Secondary | ICD-10-CM | POA: Diagnosis not present

## 2015-11-17 DIAGNOSIS — F0280 Dementia in other diseases classified elsewhere without behavioral disturbance: Secondary | ICD-10-CM | POA: Diagnosis not present

## 2015-11-17 DIAGNOSIS — R339 Retention of urine, unspecified: Secondary | ICD-10-CM | POA: Diagnosis not present

## 2015-11-17 DIAGNOSIS — Z7901 Long term (current) use of anticoagulants: Secondary | ICD-10-CM | POA: Diagnosis not present

## 2015-11-17 DIAGNOSIS — N390 Urinary tract infection, site not specified: Secondary | ICD-10-CM | POA: Diagnosis not present

## 2015-11-18 DIAGNOSIS — R2689 Other abnormalities of gait and mobility: Secondary | ICD-10-CM | POA: Diagnosis not present

## 2015-11-18 DIAGNOSIS — R269 Unspecified abnormalities of gait and mobility: Secondary | ICD-10-CM | POA: Diagnosis not present

## 2015-11-18 DIAGNOSIS — I10 Essential (primary) hypertension: Secondary | ICD-10-CM | POA: Diagnosis not present

## 2015-11-18 DIAGNOSIS — G459 Transient cerebral ischemic attack, unspecified: Secondary | ICD-10-CM | POA: Diagnosis not present

## 2015-11-18 DIAGNOSIS — M6281 Muscle weakness (generalized): Secondary | ICD-10-CM | POA: Diagnosis not present

## 2015-11-22 DIAGNOSIS — R2689 Other abnormalities of gait and mobility: Secondary | ICD-10-CM | POA: Diagnosis not present

## 2015-11-22 DIAGNOSIS — Z8672 Personal history of thrombophlebitis: Secondary | ICD-10-CM | POA: Diagnosis not present

## 2015-11-22 DIAGNOSIS — Z466 Encounter for fitting and adjustment of urinary device: Secondary | ICD-10-CM | POA: Diagnosis not present

## 2015-11-22 DIAGNOSIS — G214 Vascular parkinsonism: Secondary | ICD-10-CM | POA: Diagnosis not present

## 2015-11-22 DIAGNOSIS — F329 Major depressive disorder, single episode, unspecified: Secondary | ICD-10-CM | POA: Diagnosis not present

## 2015-11-22 DIAGNOSIS — J810 Acute pulmonary edema: Secondary | ICD-10-CM | POA: Diagnosis not present

## 2015-11-22 DIAGNOSIS — R339 Retention of urine, unspecified: Secondary | ICD-10-CM | POA: Diagnosis not present

## 2015-11-22 DIAGNOSIS — F0280 Dementia in other diseases classified elsewhere without behavioral disturbance: Secondary | ICD-10-CM | POA: Diagnosis not present

## 2015-11-22 DIAGNOSIS — Z7901 Long term (current) use of anticoagulants: Secondary | ICD-10-CM | POA: Diagnosis not present

## 2015-11-22 DIAGNOSIS — N390 Urinary tract infection, site not specified: Secondary | ICD-10-CM | POA: Diagnosis not present

## 2015-11-23 DIAGNOSIS — J159 Unspecified bacterial pneumonia: Secondary | ICD-10-CM | POA: Diagnosis not present

## 2015-11-25 DIAGNOSIS — Z8672 Personal history of thrombophlebitis: Secondary | ICD-10-CM | POA: Diagnosis not present

## 2015-11-25 DIAGNOSIS — F0280 Dementia in other diseases classified elsewhere without behavioral disturbance: Secondary | ICD-10-CM | POA: Diagnosis not present

## 2015-11-25 DIAGNOSIS — Z7901 Long term (current) use of anticoagulants: Secondary | ICD-10-CM | POA: Diagnosis not present

## 2015-11-25 DIAGNOSIS — F329 Major depressive disorder, single episode, unspecified: Secondary | ICD-10-CM | POA: Diagnosis not present

## 2015-11-25 DIAGNOSIS — R339 Retention of urine, unspecified: Secondary | ICD-10-CM | POA: Diagnosis not present

## 2015-11-25 DIAGNOSIS — N390 Urinary tract infection, site not specified: Secondary | ICD-10-CM | POA: Diagnosis not present

## 2015-11-25 DIAGNOSIS — G214 Vascular parkinsonism: Secondary | ICD-10-CM | POA: Diagnosis not present

## 2015-11-25 DIAGNOSIS — R2689 Other abnormalities of gait and mobility: Secondary | ICD-10-CM | POA: Diagnosis not present

## 2015-11-25 DIAGNOSIS — Z466 Encounter for fitting and adjustment of urinary device: Secondary | ICD-10-CM | POA: Diagnosis not present

## 2015-12-01 DIAGNOSIS — R339 Retention of urine, unspecified: Secondary | ICD-10-CM | POA: Diagnosis not present

## 2015-12-01 DIAGNOSIS — Z8672 Personal history of thrombophlebitis: Secondary | ICD-10-CM | POA: Diagnosis not present

## 2015-12-01 DIAGNOSIS — F329 Major depressive disorder, single episode, unspecified: Secondary | ICD-10-CM | POA: Diagnosis not present

## 2015-12-01 DIAGNOSIS — F0280 Dementia in other diseases classified elsewhere without behavioral disturbance: Secondary | ICD-10-CM | POA: Diagnosis not present

## 2015-12-01 DIAGNOSIS — R2689 Other abnormalities of gait and mobility: Secondary | ICD-10-CM | POA: Diagnosis not present

## 2015-12-01 DIAGNOSIS — G214 Vascular parkinsonism: Secondary | ICD-10-CM | POA: Diagnosis not present

## 2015-12-01 DIAGNOSIS — Z7901 Long term (current) use of anticoagulants: Secondary | ICD-10-CM | POA: Diagnosis not present

## 2015-12-01 DIAGNOSIS — Z466 Encounter for fitting and adjustment of urinary device: Secondary | ICD-10-CM | POA: Diagnosis not present

## 2015-12-01 DIAGNOSIS — N390 Urinary tract infection, site not specified: Secondary | ICD-10-CM | POA: Diagnosis not present

## 2015-12-04 DIAGNOSIS — F329 Major depressive disorder, single episode, unspecified: Secondary | ICD-10-CM | POA: Diagnosis not present

## 2015-12-04 DIAGNOSIS — N390 Urinary tract infection, site not specified: Secondary | ICD-10-CM | POA: Diagnosis not present

## 2015-12-04 DIAGNOSIS — R2689 Other abnormalities of gait and mobility: Secondary | ICD-10-CM | POA: Diagnosis not present

## 2015-12-04 DIAGNOSIS — F0280 Dementia in other diseases classified elsewhere without behavioral disturbance: Secondary | ICD-10-CM | POA: Diagnosis not present

## 2015-12-04 DIAGNOSIS — G214 Vascular parkinsonism: Secondary | ICD-10-CM | POA: Diagnosis not present

## 2015-12-04 DIAGNOSIS — Z466 Encounter for fitting and adjustment of urinary device: Secondary | ICD-10-CM | POA: Diagnosis not present

## 2015-12-04 DIAGNOSIS — Z8672 Personal history of thrombophlebitis: Secondary | ICD-10-CM | POA: Diagnosis not present

## 2015-12-04 DIAGNOSIS — Z7901 Long term (current) use of anticoagulants: Secondary | ICD-10-CM | POA: Diagnosis not present

## 2015-12-04 DIAGNOSIS — R339 Retention of urine, unspecified: Secondary | ICD-10-CM | POA: Diagnosis not present

## 2015-12-09 DIAGNOSIS — F0280 Dementia in other diseases classified elsewhere without behavioral disturbance: Secondary | ICD-10-CM | POA: Diagnosis not present

## 2015-12-09 DIAGNOSIS — F329 Major depressive disorder, single episode, unspecified: Secondary | ICD-10-CM | POA: Diagnosis not present

## 2015-12-09 DIAGNOSIS — R2689 Other abnormalities of gait and mobility: Secondary | ICD-10-CM | POA: Diagnosis not present

## 2015-12-09 DIAGNOSIS — Z466 Encounter for fitting and adjustment of urinary device: Secondary | ICD-10-CM | POA: Diagnosis not present

## 2015-12-09 DIAGNOSIS — N390 Urinary tract infection, site not specified: Secondary | ICD-10-CM | POA: Diagnosis not present

## 2015-12-09 DIAGNOSIS — R339 Retention of urine, unspecified: Secondary | ICD-10-CM | POA: Diagnosis not present

## 2015-12-09 DIAGNOSIS — Z8672 Personal history of thrombophlebitis: Secondary | ICD-10-CM | POA: Diagnosis not present

## 2015-12-09 DIAGNOSIS — G214 Vascular parkinsonism: Secondary | ICD-10-CM | POA: Diagnosis not present

## 2015-12-09 DIAGNOSIS — Z7901 Long term (current) use of anticoagulants: Secondary | ICD-10-CM | POA: Diagnosis not present

## 2015-12-14 DIAGNOSIS — F320 Major depressive disorder, single episode, mild: Secondary | ICD-10-CM | POA: Diagnosis not present

## 2015-12-14 DIAGNOSIS — F0280 Dementia in other diseases classified elsewhere without behavioral disturbance: Secondary | ICD-10-CM | POA: Diagnosis not present

## 2015-12-14 DIAGNOSIS — E039 Hypothyroidism, unspecified: Secondary | ICD-10-CM | POA: Diagnosis not present

## 2015-12-14 DIAGNOSIS — R339 Retention of urine, unspecified: Secondary | ICD-10-CM | POA: Diagnosis not present

## 2015-12-14 DIAGNOSIS — K5901 Slow transit constipation: Secondary | ICD-10-CM | POA: Diagnosis not present

## 2015-12-14 DIAGNOSIS — G459 Transient cerebral ischemic attack, unspecified: Secondary | ICD-10-CM | POA: Diagnosis not present

## 2015-12-19 DIAGNOSIS — M6281 Muscle weakness (generalized): Secondary | ICD-10-CM | POA: Diagnosis not present

## 2015-12-19 DIAGNOSIS — G459 Transient cerebral ischemic attack, unspecified: Secondary | ICD-10-CM | POA: Diagnosis not present

## 2015-12-19 DIAGNOSIS — R2689 Other abnormalities of gait and mobility: Secondary | ICD-10-CM | POA: Diagnosis not present

## 2015-12-19 DIAGNOSIS — R269 Unspecified abnormalities of gait and mobility: Secondary | ICD-10-CM | POA: Diagnosis not present

## 2015-12-19 DIAGNOSIS — I10 Essential (primary) hypertension: Secondary | ICD-10-CM | POA: Diagnosis not present

## 2015-12-21 DIAGNOSIS — F0280 Dementia in other diseases classified elsewhere without behavioral disturbance: Secondary | ICD-10-CM | POA: Diagnosis not present

## 2015-12-21 DIAGNOSIS — G20 Parkinson's disease: Secondary | ICD-10-CM | POA: Diagnosis not present

## 2015-12-21 DIAGNOSIS — F320 Major depressive disorder, single episode, mild: Secondary | ICD-10-CM | POA: Diagnosis not present

## 2015-12-21 DIAGNOSIS — L309 Dermatitis, unspecified: Secondary | ICD-10-CM | POA: Diagnosis not present

## 2015-12-23 DIAGNOSIS — N390 Urinary tract infection, site not specified: Secondary | ICD-10-CM | POA: Diagnosis not present

## 2015-12-23 DIAGNOSIS — Z7901 Long term (current) use of anticoagulants: Secondary | ICD-10-CM | POA: Diagnosis not present

## 2015-12-23 DIAGNOSIS — G214 Vascular parkinsonism: Secondary | ICD-10-CM | POA: Diagnosis not present

## 2015-12-23 DIAGNOSIS — R339 Retention of urine, unspecified: Secondary | ICD-10-CM | POA: Diagnosis not present

## 2015-12-23 DIAGNOSIS — R2689 Other abnormalities of gait and mobility: Secondary | ICD-10-CM | POA: Diagnosis not present

## 2015-12-23 DIAGNOSIS — F329 Major depressive disorder, single episode, unspecified: Secondary | ICD-10-CM | POA: Diagnosis not present

## 2015-12-23 DIAGNOSIS — Z8672 Personal history of thrombophlebitis: Secondary | ICD-10-CM | POA: Diagnosis not present

## 2015-12-23 DIAGNOSIS — Z466 Encounter for fitting and adjustment of urinary device: Secondary | ICD-10-CM | POA: Diagnosis not present

## 2015-12-23 DIAGNOSIS — F0280 Dementia in other diseases classified elsewhere without behavioral disturbance: Secondary | ICD-10-CM | POA: Diagnosis not present

## 2015-12-29 DIAGNOSIS — Z466 Encounter for fitting and adjustment of urinary device: Secondary | ICD-10-CM | POA: Diagnosis not present

## 2015-12-29 DIAGNOSIS — R2689 Other abnormalities of gait and mobility: Secondary | ICD-10-CM | POA: Diagnosis not present

## 2015-12-29 DIAGNOSIS — Z7901 Long term (current) use of anticoagulants: Secondary | ICD-10-CM | POA: Diagnosis not present

## 2015-12-29 DIAGNOSIS — F0280 Dementia in other diseases classified elsewhere without behavioral disturbance: Secondary | ICD-10-CM | POA: Diagnosis not present

## 2015-12-29 DIAGNOSIS — Z8672 Personal history of thrombophlebitis: Secondary | ICD-10-CM | POA: Diagnosis not present

## 2015-12-29 DIAGNOSIS — F329 Major depressive disorder, single episode, unspecified: Secondary | ICD-10-CM | POA: Diagnosis not present

## 2015-12-29 DIAGNOSIS — N390 Urinary tract infection, site not specified: Secondary | ICD-10-CM | POA: Diagnosis not present

## 2015-12-29 DIAGNOSIS — G214 Vascular parkinsonism: Secondary | ICD-10-CM | POA: Diagnosis not present

## 2015-12-29 DIAGNOSIS — R339 Retention of urine, unspecified: Secondary | ICD-10-CM | POA: Diagnosis not present

## 2016-01-11 DIAGNOSIS — G459 Transient cerebral ischemic attack, unspecified: Secondary | ICD-10-CM | POA: Diagnosis not present

## 2016-01-11 DIAGNOSIS — R339 Retention of urine, unspecified: Secondary | ICD-10-CM | POA: Diagnosis not present

## 2016-01-11 DIAGNOSIS — Z8672 Personal history of thrombophlebitis: Secondary | ICD-10-CM | POA: Diagnosis not present

## 2016-01-11 DIAGNOSIS — E039 Hypothyroidism, unspecified: Secondary | ICD-10-CM | POA: Diagnosis not present

## 2016-01-11 DIAGNOSIS — Z466 Encounter for fitting and adjustment of urinary device: Secondary | ICD-10-CM | POA: Diagnosis not present

## 2016-01-11 DIAGNOSIS — N390 Urinary tract infection, site not specified: Secondary | ICD-10-CM | POA: Diagnosis not present

## 2016-01-11 DIAGNOSIS — F329 Major depressive disorder, single episode, unspecified: Secondary | ICD-10-CM | POA: Diagnosis not present

## 2016-01-11 DIAGNOSIS — F320 Major depressive disorder, single episode, mild: Secondary | ICD-10-CM | POA: Diagnosis not present

## 2016-01-11 DIAGNOSIS — G214 Vascular parkinsonism: Secondary | ICD-10-CM | POA: Diagnosis not present

## 2016-01-11 DIAGNOSIS — K5901 Slow transit constipation: Secondary | ICD-10-CM | POA: Diagnosis not present

## 2016-01-11 DIAGNOSIS — F0280 Dementia in other diseases classified elsewhere without behavioral disturbance: Secondary | ICD-10-CM | POA: Diagnosis not present

## 2016-01-11 DIAGNOSIS — Z7901 Long term (current) use of anticoagulants: Secondary | ICD-10-CM | POA: Diagnosis not present

## 2016-01-11 DIAGNOSIS — R2689 Other abnormalities of gait and mobility: Secondary | ICD-10-CM | POA: Diagnosis not present

## 2016-01-12 DIAGNOSIS — G214 Vascular parkinsonism: Secondary | ICD-10-CM | POA: Diagnosis not present

## 2016-01-12 DIAGNOSIS — Z7901 Long term (current) use of anticoagulants: Secondary | ICD-10-CM | POA: Diagnosis not present

## 2016-01-12 DIAGNOSIS — R2689 Other abnormalities of gait and mobility: Secondary | ICD-10-CM | POA: Diagnosis not present

## 2016-01-12 DIAGNOSIS — F329 Major depressive disorder, single episode, unspecified: Secondary | ICD-10-CM | POA: Diagnosis not present

## 2016-01-12 DIAGNOSIS — Z8672 Personal history of thrombophlebitis: Secondary | ICD-10-CM | POA: Diagnosis not present

## 2016-01-12 DIAGNOSIS — F0280 Dementia in other diseases classified elsewhere without behavioral disturbance: Secondary | ICD-10-CM | POA: Diagnosis not present

## 2016-01-12 DIAGNOSIS — N390 Urinary tract infection, site not specified: Secondary | ICD-10-CM | POA: Diagnosis not present

## 2016-01-12 DIAGNOSIS — Z466 Encounter for fitting and adjustment of urinary device: Secondary | ICD-10-CM | POA: Diagnosis not present

## 2016-01-12 DIAGNOSIS — R339 Retention of urine, unspecified: Secondary | ICD-10-CM | POA: Diagnosis not present

## 2016-01-14 DIAGNOSIS — R05 Cough: Secondary | ICD-10-CM | POA: Diagnosis not present

## 2016-01-14 DIAGNOSIS — Z466 Encounter for fitting and adjustment of urinary device: Secondary | ICD-10-CM | POA: Diagnosis not present

## 2016-01-14 DIAGNOSIS — F329 Major depressive disorder, single episode, unspecified: Secondary | ICD-10-CM | POA: Diagnosis not present

## 2016-01-14 DIAGNOSIS — F0280 Dementia in other diseases classified elsewhere without behavioral disturbance: Secondary | ICD-10-CM | POA: Diagnosis not present

## 2016-01-14 DIAGNOSIS — R339 Retention of urine, unspecified: Secondary | ICD-10-CM | POA: Diagnosis not present

## 2016-01-14 DIAGNOSIS — Z8672 Personal history of thrombophlebitis: Secondary | ICD-10-CM | POA: Diagnosis not present

## 2016-01-14 DIAGNOSIS — N390 Urinary tract infection, site not specified: Secondary | ICD-10-CM | POA: Diagnosis not present

## 2016-01-14 DIAGNOSIS — G214 Vascular parkinsonism: Secondary | ICD-10-CM | POA: Diagnosis not present

## 2016-01-14 DIAGNOSIS — R2689 Other abnormalities of gait and mobility: Secondary | ICD-10-CM | POA: Diagnosis not present

## 2016-01-14 DIAGNOSIS — Z7901 Long term (current) use of anticoagulants: Secondary | ICD-10-CM | POA: Diagnosis not present

## 2016-01-17 DIAGNOSIS — G459 Transient cerebral ischemic attack, unspecified: Secondary | ICD-10-CM | POA: Diagnosis not present

## 2016-01-17 DIAGNOSIS — R2689 Other abnormalities of gait and mobility: Secondary | ICD-10-CM | POA: Diagnosis not present

## 2016-01-17 DIAGNOSIS — R269 Unspecified abnormalities of gait and mobility: Secondary | ICD-10-CM | POA: Diagnosis not present

## 2016-01-17 DIAGNOSIS — M6281 Muscle weakness (generalized): Secondary | ICD-10-CM | POA: Diagnosis not present

## 2016-01-17 DIAGNOSIS — I10 Essential (primary) hypertension: Secondary | ICD-10-CM | POA: Diagnosis not present

## 2016-01-18 DIAGNOSIS — G20 Parkinson's disease: Secondary | ICD-10-CM | POA: Diagnosis not present

## 2016-01-18 DIAGNOSIS — F320 Major depressive disorder, single episode, mild: Secondary | ICD-10-CM | POA: Diagnosis not present

## 2016-01-18 DIAGNOSIS — F0280 Dementia in other diseases classified elsewhere without behavioral disturbance: Secondary | ICD-10-CM | POA: Diagnosis not present

## 2016-01-30 DIAGNOSIS — R600 Localized edema: Secondary | ICD-10-CM | POA: Diagnosis not present

## 2016-02-07 DIAGNOSIS — F0280 Dementia in other diseases classified elsewhere without behavioral disturbance: Secondary | ICD-10-CM | POA: Diagnosis not present

## 2016-02-07 DIAGNOSIS — I69351 Hemiplegia and hemiparesis following cerebral infarction affecting right dominant side: Secondary | ICD-10-CM | POA: Diagnosis not present

## 2016-02-07 DIAGNOSIS — G3183 Dementia with Lewy bodies: Secondary | ICD-10-CM | POA: Diagnosis not present

## 2016-02-07 DIAGNOSIS — G309 Alzheimer's disease, unspecified: Secondary | ICD-10-CM | POA: Diagnosis not present

## 2016-02-07 DIAGNOSIS — I10 Essential (primary) hypertension: Secondary | ICD-10-CM | POA: Diagnosis not present

## 2016-02-07 DIAGNOSIS — Z466 Encounter for fitting and adjustment of urinary device: Secondary | ICD-10-CM | POA: Diagnosis not present

## 2016-02-07 DIAGNOSIS — R339 Retention of urine, unspecified: Secondary | ICD-10-CM | POA: Diagnosis not present

## 2016-02-07 DIAGNOSIS — R399 Unspecified symptoms and signs involving the genitourinary system: Secondary | ICD-10-CM | POA: Diagnosis not present

## 2016-02-07 DIAGNOSIS — I69392 Facial weakness following cerebral infarction: Secondary | ICD-10-CM | POA: Diagnosis not present

## 2016-02-08 DIAGNOSIS — B372 Candidiasis of skin and nail: Secondary | ICD-10-CM | POA: Diagnosis not present

## 2016-02-08 DIAGNOSIS — G459 Transient cerebral ischemic attack, unspecified: Secondary | ICD-10-CM | POA: Diagnosis not present

## 2016-02-08 DIAGNOSIS — F0280 Dementia in other diseases classified elsewhere without behavioral disturbance: Secondary | ICD-10-CM | POA: Diagnosis not present

## 2016-02-08 DIAGNOSIS — F320 Major depressive disorder, single episode, mild: Secondary | ICD-10-CM | POA: Diagnosis not present

## 2016-02-08 DIAGNOSIS — R339 Retention of urine, unspecified: Secondary | ICD-10-CM | POA: Diagnosis not present

## 2016-02-08 DIAGNOSIS — E039 Hypothyroidism, unspecified: Secondary | ICD-10-CM | POA: Diagnosis not present

## 2016-02-08 DIAGNOSIS — K5901 Slow transit constipation: Secondary | ICD-10-CM | POA: Diagnosis not present

## 2016-02-09 DIAGNOSIS — R399 Unspecified symptoms and signs involving the genitourinary system: Secondary | ICD-10-CM | POA: Diagnosis not present

## 2016-02-09 DIAGNOSIS — Z466 Encounter for fitting and adjustment of urinary device: Secondary | ICD-10-CM | POA: Diagnosis not present

## 2016-02-09 DIAGNOSIS — F0280 Dementia in other diseases classified elsewhere without behavioral disturbance: Secondary | ICD-10-CM | POA: Diagnosis not present

## 2016-02-09 DIAGNOSIS — I10 Essential (primary) hypertension: Secondary | ICD-10-CM | POA: Diagnosis not present

## 2016-02-09 DIAGNOSIS — G3183 Dementia with Lewy bodies: Secondary | ICD-10-CM | POA: Diagnosis not present

## 2016-02-09 DIAGNOSIS — R339 Retention of urine, unspecified: Secondary | ICD-10-CM | POA: Diagnosis not present

## 2016-02-13 DIAGNOSIS — Z466 Encounter for fitting and adjustment of urinary device: Secondary | ICD-10-CM | POA: Diagnosis not present

## 2016-02-13 DIAGNOSIS — F0280 Dementia in other diseases classified elsewhere without behavioral disturbance: Secondary | ICD-10-CM | POA: Diagnosis not present

## 2016-02-13 DIAGNOSIS — G3183 Dementia with Lewy bodies: Secondary | ICD-10-CM | POA: Diagnosis not present

## 2016-02-13 DIAGNOSIS — R399 Unspecified symptoms and signs involving the genitourinary system: Secondary | ICD-10-CM | POA: Diagnosis not present

## 2016-02-13 DIAGNOSIS — I10 Essential (primary) hypertension: Secondary | ICD-10-CM | POA: Diagnosis not present

## 2016-02-13 DIAGNOSIS — R339 Retention of urine, unspecified: Secondary | ICD-10-CM | POA: Diagnosis not present

## 2016-02-15 DIAGNOSIS — F0280 Dementia in other diseases classified elsewhere without behavioral disturbance: Secondary | ICD-10-CM | POA: Diagnosis not present

## 2016-02-15 DIAGNOSIS — R399 Unspecified symptoms and signs involving the genitourinary system: Secondary | ICD-10-CM | POA: Diagnosis not present

## 2016-02-15 DIAGNOSIS — R339 Retention of urine, unspecified: Secondary | ICD-10-CM | POA: Diagnosis not present

## 2016-02-15 DIAGNOSIS — I10 Essential (primary) hypertension: Secondary | ICD-10-CM | POA: Diagnosis not present

## 2016-02-15 DIAGNOSIS — G3183 Dementia with Lewy bodies: Secondary | ICD-10-CM | POA: Diagnosis not present

## 2016-02-15 DIAGNOSIS — B372 Candidiasis of skin and nail: Secondary | ICD-10-CM | POA: Diagnosis not present

## 2016-02-15 DIAGNOSIS — K5901 Slow transit constipation: Secondary | ICD-10-CM | POA: Diagnosis not present

## 2016-02-15 DIAGNOSIS — Z466 Encounter for fitting and adjustment of urinary device: Secondary | ICD-10-CM | POA: Diagnosis not present

## 2016-02-15 DIAGNOSIS — G459 Transient cerebral ischemic attack, unspecified: Secondary | ICD-10-CM | POA: Diagnosis not present

## 2016-02-15 DIAGNOSIS — F320 Major depressive disorder, single episode, mild: Secondary | ICD-10-CM | POA: Diagnosis not present

## 2016-02-15 DIAGNOSIS — E039 Hypothyroidism, unspecified: Secondary | ICD-10-CM | POA: Diagnosis not present

## 2016-02-16 DIAGNOSIS — I10 Essential (primary) hypertension: Secondary | ICD-10-CM | POA: Diagnosis not present

## 2016-02-16 DIAGNOSIS — R269 Unspecified abnormalities of gait and mobility: Secondary | ICD-10-CM | POA: Diagnosis not present

## 2016-02-16 DIAGNOSIS — M6281 Muscle weakness (generalized): Secondary | ICD-10-CM | POA: Diagnosis not present

## 2016-02-16 DIAGNOSIS — G459 Transient cerebral ischemic attack, unspecified: Secondary | ICD-10-CM | POA: Diagnosis not present

## 2016-02-16 DIAGNOSIS — R2689 Other abnormalities of gait and mobility: Secondary | ICD-10-CM | POA: Diagnosis not present

## 2016-02-17 DIAGNOSIS — G3183 Dementia with Lewy bodies: Secondary | ICD-10-CM | POA: Diagnosis not present

## 2016-02-17 DIAGNOSIS — R399 Unspecified symptoms and signs involving the genitourinary system: Secondary | ICD-10-CM | POA: Diagnosis not present

## 2016-02-17 DIAGNOSIS — Z466 Encounter for fitting and adjustment of urinary device: Secondary | ICD-10-CM | POA: Diagnosis not present

## 2016-02-17 DIAGNOSIS — F0280 Dementia in other diseases classified elsewhere without behavioral disturbance: Secondary | ICD-10-CM | POA: Diagnosis not present

## 2016-02-17 DIAGNOSIS — I10 Essential (primary) hypertension: Secondary | ICD-10-CM | POA: Diagnosis not present

## 2016-02-17 DIAGNOSIS — R339 Retention of urine, unspecified: Secondary | ICD-10-CM | POA: Diagnosis not present

## 2016-02-21 DIAGNOSIS — F0280 Dementia in other diseases classified elsewhere without behavioral disturbance: Secondary | ICD-10-CM | POA: Diagnosis not present

## 2016-02-21 DIAGNOSIS — I10 Essential (primary) hypertension: Secondary | ICD-10-CM | POA: Diagnosis not present

## 2016-02-21 DIAGNOSIS — Z466 Encounter for fitting and adjustment of urinary device: Secondary | ICD-10-CM | POA: Diagnosis not present

## 2016-02-21 DIAGNOSIS — R399 Unspecified symptoms and signs involving the genitourinary system: Secondary | ICD-10-CM | POA: Diagnosis not present

## 2016-02-21 DIAGNOSIS — R339 Retention of urine, unspecified: Secondary | ICD-10-CM | POA: Diagnosis not present

## 2016-02-21 DIAGNOSIS — G3183 Dementia with Lewy bodies: Secondary | ICD-10-CM | POA: Diagnosis not present

## 2016-02-22 DIAGNOSIS — F320 Major depressive disorder, single episode, mild: Secondary | ICD-10-CM | POA: Diagnosis not present

## 2016-02-22 DIAGNOSIS — F424 Excoriation (skin-picking) disorder: Secondary | ICD-10-CM | POA: Diagnosis not present

## 2016-02-22 DIAGNOSIS — B379 Candidiasis, unspecified: Secondary | ICD-10-CM | POA: Diagnosis not present

## 2016-02-22 DIAGNOSIS — G20 Parkinson's disease: Secondary | ICD-10-CM | POA: Diagnosis not present

## 2016-02-22 DIAGNOSIS — L02215 Cutaneous abscess of perineum: Secondary | ICD-10-CM | POA: Diagnosis not present

## 2016-02-22 DIAGNOSIS — F0280 Dementia in other diseases classified elsewhere without behavioral disturbance: Secondary | ICD-10-CM | POA: Diagnosis not present

## 2016-02-24 DIAGNOSIS — I10 Essential (primary) hypertension: Secondary | ICD-10-CM | POA: Diagnosis not present

## 2016-02-24 DIAGNOSIS — G3183 Dementia with Lewy bodies: Secondary | ICD-10-CM | POA: Diagnosis not present

## 2016-02-24 DIAGNOSIS — F0280 Dementia in other diseases classified elsewhere without behavioral disturbance: Secondary | ICD-10-CM | POA: Diagnosis not present

## 2016-02-24 DIAGNOSIS — R339 Retention of urine, unspecified: Secondary | ICD-10-CM | POA: Diagnosis not present

## 2016-02-24 DIAGNOSIS — Z466 Encounter for fitting and adjustment of urinary device: Secondary | ICD-10-CM | POA: Diagnosis not present

## 2016-02-24 DIAGNOSIS — R399 Unspecified symptoms and signs involving the genitourinary system: Secondary | ICD-10-CM | POA: Diagnosis not present

## 2016-02-27 DIAGNOSIS — Z466 Encounter for fitting and adjustment of urinary device: Secondary | ICD-10-CM | POA: Diagnosis not present

## 2016-02-27 DIAGNOSIS — R339 Retention of urine, unspecified: Secondary | ICD-10-CM | POA: Diagnosis not present

## 2016-02-27 DIAGNOSIS — F0280 Dementia in other diseases classified elsewhere without behavioral disturbance: Secondary | ICD-10-CM | POA: Diagnosis not present

## 2016-02-27 DIAGNOSIS — G3183 Dementia with Lewy bodies: Secondary | ICD-10-CM | POA: Diagnosis not present

## 2016-02-27 DIAGNOSIS — R399 Unspecified symptoms and signs involving the genitourinary system: Secondary | ICD-10-CM | POA: Diagnosis not present

## 2016-02-27 DIAGNOSIS — I10 Essential (primary) hypertension: Secondary | ICD-10-CM | POA: Diagnosis not present

## 2016-03-14 DIAGNOSIS — E039 Hypothyroidism, unspecified: Secondary | ICD-10-CM | POA: Diagnosis not present

## 2016-03-14 DIAGNOSIS — E782 Mixed hyperlipidemia: Secondary | ICD-10-CM | POA: Diagnosis not present

## 2016-03-14 DIAGNOSIS — I10 Essential (primary) hypertension: Secondary | ICD-10-CM | POA: Diagnosis not present

## 2016-03-18 DIAGNOSIS — I10 Essential (primary) hypertension: Secondary | ICD-10-CM | POA: Diagnosis not present

## 2016-03-18 DIAGNOSIS — M6281 Muscle weakness (generalized): Secondary | ICD-10-CM | POA: Diagnosis not present

## 2016-03-18 DIAGNOSIS — G459 Transient cerebral ischemic attack, unspecified: Secondary | ICD-10-CM | POA: Diagnosis not present

## 2016-03-18 DIAGNOSIS — R269 Unspecified abnormalities of gait and mobility: Secondary | ICD-10-CM | POA: Diagnosis not present

## 2016-03-18 DIAGNOSIS — R2689 Other abnormalities of gait and mobility: Secondary | ICD-10-CM | POA: Diagnosis not present

## 2016-03-21 DIAGNOSIS — F320 Major depressive disorder, single episode, mild: Secondary | ICD-10-CM | POA: Diagnosis not present

## 2016-03-21 DIAGNOSIS — G20 Parkinson's disease: Secondary | ICD-10-CM | POA: Diagnosis not present

## 2016-03-21 DIAGNOSIS — F0280 Dementia in other diseases classified elsewhere without behavioral disturbance: Secondary | ICD-10-CM | POA: Diagnosis not present

## 2016-04-04 DIAGNOSIS — F320 Major depressive disorder, single episode, mild: Secondary | ICD-10-CM | POA: Diagnosis not present

## 2016-04-04 DIAGNOSIS — B372 Candidiasis of skin and nail: Secondary | ICD-10-CM | POA: Diagnosis not present

## 2016-04-04 DIAGNOSIS — G459 Transient cerebral ischemic attack, unspecified: Secondary | ICD-10-CM | POA: Diagnosis not present

## 2016-04-04 DIAGNOSIS — K5901 Slow transit constipation: Secondary | ICD-10-CM | POA: Diagnosis not present

## 2016-04-04 DIAGNOSIS — R339 Retention of urine, unspecified: Secondary | ICD-10-CM | POA: Diagnosis not present

## 2016-04-04 DIAGNOSIS — E039 Hypothyroidism, unspecified: Secondary | ICD-10-CM | POA: Diagnosis not present

## 2016-04-04 DIAGNOSIS — F0280 Dementia in other diseases classified elsewhere without behavioral disturbance: Secondary | ICD-10-CM | POA: Diagnosis not present

## 2016-04-16 DIAGNOSIS — R339 Retention of urine, unspecified: Secondary | ICD-10-CM | POA: Diagnosis not present

## 2016-04-16 DIAGNOSIS — G3183 Dementia with Lewy bodies: Secondary | ICD-10-CM | POA: Diagnosis not present

## 2016-04-16 DIAGNOSIS — I10 Essential (primary) hypertension: Secondary | ICD-10-CM | POA: Diagnosis not present

## 2016-04-16 DIAGNOSIS — Z466 Encounter for fitting and adjustment of urinary device: Secondary | ICD-10-CM | POA: Diagnosis not present

## 2016-04-16 DIAGNOSIS — Z87440 Personal history of urinary (tract) infections: Secondary | ICD-10-CM | POA: Diagnosis not present

## 2016-04-16 DIAGNOSIS — F0280 Dementia in other diseases classified elsewhere without behavioral disturbance: Secondary | ICD-10-CM | POA: Diagnosis not present

## 2016-04-17 DIAGNOSIS — R269 Unspecified abnormalities of gait and mobility: Secondary | ICD-10-CM | POA: Diagnosis not present

## 2016-04-17 DIAGNOSIS — I10 Essential (primary) hypertension: Secondary | ICD-10-CM | POA: Diagnosis not present

## 2016-04-17 DIAGNOSIS — N390 Urinary tract infection, site not specified: Secondary | ICD-10-CM | POA: Diagnosis not present

## 2016-04-17 DIAGNOSIS — G459 Transient cerebral ischemic attack, unspecified: Secondary | ICD-10-CM | POA: Diagnosis not present

## 2016-04-17 DIAGNOSIS — M6281 Muscle weakness (generalized): Secondary | ICD-10-CM | POA: Diagnosis not present

## 2016-04-17 DIAGNOSIS — R2689 Other abnormalities of gait and mobility: Secondary | ICD-10-CM | POA: Diagnosis not present

## 2016-04-19 DIAGNOSIS — Z87440 Personal history of urinary (tract) infections: Secondary | ICD-10-CM | POA: Diagnosis not present

## 2016-04-19 DIAGNOSIS — Z466 Encounter for fitting and adjustment of urinary device: Secondary | ICD-10-CM | POA: Diagnosis not present

## 2016-04-19 DIAGNOSIS — R339 Retention of urine, unspecified: Secondary | ICD-10-CM | POA: Diagnosis not present

## 2016-04-19 DIAGNOSIS — F0280 Dementia in other diseases classified elsewhere without behavioral disturbance: Secondary | ICD-10-CM | POA: Diagnosis not present

## 2016-04-19 DIAGNOSIS — G3183 Dementia with Lewy bodies: Secondary | ICD-10-CM | POA: Diagnosis not present

## 2016-04-19 DIAGNOSIS — I10 Essential (primary) hypertension: Secondary | ICD-10-CM | POA: Diagnosis not present

## 2016-05-09 DIAGNOSIS — Z87440 Personal history of urinary (tract) infections: Secondary | ICD-10-CM | POA: Diagnosis not present

## 2016-05-09 DIAGNOSIS — K5901 Slow transit constipation: Secondary | ICD-10-CM | POA: Diagnosis not present

## 2016-05-09 DIAGNOSIS — E039 Hypothyroidism, unspecified: Secondary | ICD-10-CM | POA: Diagnosis not present

## 2016-05-09 DIAGNOSIS — R339 Retention of urine, unspecified: Secondary | ICD-10-CM | POA: Diagnosis not present

## 2016-05-09 DIAGNOSIS — F0280 Dementia in other diseases classified elsewhere without behavioral disturbance: Secondary | ICD-10-CM | POA: Diagnosis not present

## 2016-05-09 DIAGNOSIS — F320 Major depressive disorder, single episode, mild: Secondary | ICD-10-CM | POA: Diagnosis not present

## 2016-05-09 DIAGNOSIS — G459 Transient cerebral ischemic attack, unspecified: Secondary | ICD-10-CM | POA: Diagnosis not present

## 2016-05-09 DIAGNOSIS — B372 Candidiasis of skin and nail: Secondary | ICD-10-CM | POA: Diagnosis not present

## 2016-05-14 DIAGNOSIS — G3183 Dementia with Lewy bodies: Secondary | ICD-10-CM | POA: Diagnosis not present

## 2016-05-14 DIAGNOSIS — F0280 Dementia in other diseases classified elsewhere without behavioral disturbance: Secondary | ICD-10-CM | POA: Diagnosis not present

## 2016-05-14 DIAGNOSIS — Z87440 Personal history of urinary (tract) infections: Secondary | ICD-10-CM | POA: Diagnosis not present

## 2016-05-14 DIAGNOSIS — I10 Essential (primary) hypertension: Secondary | ICD-10-CM | POA: Diagnosis not present

## 2016-05-14 DIAGNOSIS — Z466 Encounter for fitting and adjustment of urinary device: Secondary | ICD-10-CM | POA: Diagnosis not present

## 2016-05-14 DIAGNOSIS — R339 Retention of urine, unspecified: Secondary | ICD-10-CM | POA: Diagnosis not present

## 2016-05-17 ENCOUNTER — Encounter (HOSPITAL_COMMUNITY): Payer: Self-pay

## 2016-05-17 ENCOUNTER — Emergency Department (HOSPITAL_COMMUNITY): Payer: Commercial Managed Care - HMO

## 2016-05-17 ENCOUNTER — Inpatient Hospital Stay (HOSPITAL_COMMUNITY): Payer: Commercial Managed Care - HMO

## 2016-05-17 ENCOUNTER — Inpatient Hospital Stay (HOSPITAL_COMMUNITY)
Admission: EM | Admit: 2016-05-17 | Discharge: 2016-05-19 | DRG: 064 | Disposition: A | Discharge status: Nursing Home (Includes ICF) | Payer: Commercial Managed Care - HMO | Attending: Internal Medicine | Admitting: Internal Medicine

## 2016-05-17 DIAGNOSIS — D638 Anemia in other chronic diseases classified elsewhere: Secondary | ICD-10-CM | POA: Diagnosis present

## 2016-05-17 DIAGNOSIS — F419 Anxiety disorder, unspecified: Secondary | ICD-10-CM | POA: Diagnosis present

## 2016-05-17 DIAGNOSIS — F329 Major depressive disorder, single episode, unspecified: Secondary | ICD-10-CM | POA: Diagnosis present

## 2016-05-17 DIAGNOSIS — F0390 Unspecified dementia without behavioral disturbance: Secondary | ICD-10-CM

## 2016-05-17 DIAGNOSIS — Z8249 Family history of ischemic heart disease and other diseases of the circulatory system: Secondary | ICD-10-CM

## 2016-05-17 DIAGNOSIS — G459 Transient cerebral ischemic attack, unspecified: Secondary | ICD-10-CM | POA: Diagnosis not present

## 2016-05-17 DIAGNOSIS — R29810 Facial weakness: Secondary | ICD-10-CM | POA: Diagnosis not present

## 2016-05-17 DIAGNOSIS — N390 Urinary tract infection, site not specified: Secondary | ICD-10-CM | POA: Diagnosis not present

## 2016-05-17 DIAGNOSIS — Z823 Family history of stroke: Secondary | ICD-10-CM | POA: Diagnosis not present

## 2016-05-17 DIAGNOSIS — Z888 Allergy status to other drugs, medicaments and biological substances status: Secondary | ICD-10-CM | POA: Diagnosis not present

## 2016-05-17 DIAGNOSIS — I63039 Cerebral infarction due to thrombosis of unspecified carotid artery: Secondary | ICD-10-CM | POA: Diagnosis not present

## 2016-05-17 DIAGNOSIS — Z7902 Long term (current) use of antithrombotics/antiplatelets: Secondary | ICD-10-CM

## 2016-05-17 DIAGNOSIS — E039 Hypothyroidism, unspecified: Secondary | ICD-10-CM | POA: Diagnosis present

## 2016-05-17 DIAGNOSIS — Z87440 Personal history of urinary (tract) infections: Secondary | ICD-10-CM

## 2016-05-17 DIAGNOSIS — I6789 Other cerebrovascular disease: Secondary | ICD-10-CM | POA: Diagnosis not present

## 2016-05-17 DIAGNOSIS — R29818 Other symptoms and signs involving the nervous system: Secondary | ICD-10-CM | POA: Diagnosis not present

## 2016-05-17 DIAGNOSIS — Z96642 Presence of left artificial hip joint: Secondary | ICD-10-CM | POA: Diagnosis present

## 2016-05-17 DIAGNOSIS — R2689 Other abnormalities of gait and mobility: Secondary | ICD-10-CM | POA: Diagnosis not present

## 2016-05-17 DIAGNOSIS — E785 Hyperlipidemia, unspecified: Secondary | ICD-10-CM | POA: Diagnosis present

## 2016-05-17 DIAGNOSIS — I10 Essential (primary) hypertension: Secondary | ICD-10-CM | POA: Diagnosis not present

## 2016-05-17 DIAGNOSIS — G20 Parkinson's disease: Secondary | ICD-10-CM | POA: Diagnosis not present

## 2016-05-17 DIAGNOSIS — F0150 Vascular dementia without behavioral disturbance: Secondary | ICD-10-CM | POA: Diagnosis present

## 2016-05-17 DIAGNOSIS — E860 Dehydration: Secondary | ICD-10-CM | POA: Diagnosis not present

## 2016-05-17 DIAGNOSIS — I638 Other cerebral infarction: Secondary | ICD-10-CM | POA: Diagnosis not present

## 2016-05-17 DIAGNOSIS — Z881 Allergy status to other antibiotic agents status: Secondary | ICD-10-CM | POA: Diagnosis not present

## 2016-05-17 DIAGNOSIS — Z79899 Other long term (current) drug therapy: Secondary | ICD-10-CM | POA: Diagnosis not present

## 2016-05-17 DIAGNOSIS — I63032 Cerebral infarction due to thrombosis of left carotid artery: Secondary | ICD-10-CM | POA: Diagnosis not present

## 2016-05-17 DIAGNOSIS — Y846 Urinary catheterization as the cause of abnormal reaction of the patient, or of later complication, without mention of misadventure at the time of the procedure: Secondary | ICD-10-CM | POA: Diagnosis present

## 2016-05-17 DIAGNOSIS — D72829 Elevated white blood cell count, unspecified: Secondary | ICD-10-CM | POA: Diagnosis present

## 2016-05-17 DIAGNOSIS — R269 Unspecified abnormalities of gait and mobility: Secondary | ICD-10-CM | POA: Diagnosis not present

## 2016-05-17 DIAGNOSIS — F32A Depression, unspecified: Secondary | ICD-10-CM | POA: Diagnosis present

## 2016-05-17 DIAGNOSIS — I69351 Hemiplegia and hemiparesis following cerebral infarction affecting right dominant side: Secondary | ICD-10-CM | POA: Diagnosis not present

## 2016-05-17 DIAGNOSIS — E538 Deficiency of other specified B group vitamins: Secondary | ICD-10-CM | POA: Diagnosis present

## 2016-05-17 DIAGNOSIS — Z8673 Personal history of transient ischemic attack (TIA), and cerebral infarction without residual deficits: Secondary | ICD-10-CM | POA: Diagnosis not present

## 2016-05-17 DIAGNOSIS — R4189 Other symptoms and signs involving cognitive functions and awareness: Secondary | ICD-10-CM | POA: Diagnosis present

## 2016-05-17 DIAGNOSIS — I639 Cerebral infarction, unspecified: Principal | ICD-10-CM | POA: Diagnosis present

## 2016-05-17 DIAGNOSIS — T83511A Infection and inflammatory reaction due to indwelling urethral catheter, initial encounter: Secondary | ICD-10-CM | POA: Diagnosis present

## 2016-05-17 DIAGNOSIS — G9340 Encephalopathy, unspecified: Secondary | ICD-10-CM | POA: Diagnosis present

## 2016-05-17 DIAGNOSIS — F03C0 Unspecified dementia, severe, without behavioral disturbance, psychotic disturbance, mood disturbance, and anxiety: Secondary | ICD-10-CM | POA: Diagnosis present

## 2016-05-17 DIAGNOSIS — R339 Retention of urine, unspecified: Secondary | ICD-10-CM

## 2016-05-17 DIAGNOSIS — K5900 Constipation, unspecified: Secondary | ICD-10-CM | POA: Diagnosis present

## 2016-05-17 DIAGNOSIS — M6281 Muscle weakness (generalized): Secondary | ICD-10-CM | POA: Diagnosis not present

## 2016-05-17 DIAGNOSIS — J9811 Atelectasis: Secondary | ICD-10-CM | POA: Diagnosis not present

## 2016-05-17 LAB — DIFFERENTIAL
BASOS ABS: 0 10*3/uL (ref 0.0–0.1)
BASOS PCT: 0 %
EOS ABS: 0 10*3/uL (ref 0.0–0.7)
EOS PCT: 0 %
Lymphocytes Relative: 5 %
Lymphs Abs: 1 10*3/uL (ref 0.7–4.0)
MONOS PCT: 7 %
Monocytes Absolute: 1.5 10*3/uL — ABNORMAL HIGH (ref 0.1–1.0)
Neutro Abs: 18.1 10*3/uL — ABNORMAL HIGH (ref 1.7–7.7)
Neutrophils Relative %: 88 %

## 2016-05-17 LAB — APTT
APTT: 34 s (ref 24–37)
aPTT: 29 seconds (ref 24–37)

## 2016-05-17 LAB — URINALYSIS, ROUTINE W REFLEX MICROSCOPIC
Bilirubin Urine: NEGATIVE
GLUCOSE, UA: NEGATIVE mg/dL
Ketones, ur: NEGATIVE mg/dL
Nitrite: POSITIVE — AB
PH: 6.5 (ref 5.0–8.0)
Protein, ur: 100 mg/dL — AB
SPECIFIC GRAVITY, URINE: 1.016 (ref 1.005–1.030)

## 2016-05-17 LAB — CBC
HCT: 36.6 % (ref 36.0–46.0)
Hemoglobin: 11.6 g/dL — ABNORMAL LOW (ref 12.0–15.0)
MCH: 28 pg (ref 26.0–34.0)
MCHC: 31.7 g/dL (ref 30.0–36.0)
MCV: 88.4 fL (ref 78.0–100.0)
Platelets: 362 10*3/uL (ref 150–400)
RBC: 4.14 MIL/uL (ref 3.87–5.11)
RDW: 13.3 % (ref 11.5–15.5)
WBC: 20.7 10*3/uL — ABNORMAL HIGH (ref 4.0–10.5)

## 2016-05-17 LAB — I-STAT TROPONIN, ED: TROPONIN I, POC: 0.01 ng/mL (ref 0.00–0.08)

## 2016-05-17 LAB — I-STAT CHEM 8, ED
BUN: 13 mg/dL (ref 6–20)
CALCIUM ION: 1.09 mmol/L — AB (ref 1.12–1.23)
Chloride: 98 mmol/L — ABNORMAL LOW (ref 101–111)
Creatinine, Ser: 0.8 mg/dL (ref 0.44–1.00)
Glucose, Bld: 129 mg/dL — ABNORMAL HIGH (ref 65–99)
HCT: 38 % (ref 36.0–46.0)
HEMOGLOBIN: 12.9 g/dL (ref 12.0–15.0)
Potassium: 4.6 mmol/L (ref 3.5–5.1)
SODIUM: 136 mmol/L (ref 135–145)
TCO2: 27 mmol/L (ref 0–100)

## 2016-05-17 LAB — URINE MICROSCOPIC-ADD ON

## 2016-05-17 LAB — COMPREHENSIVE METABOLIC PANEL
ALT: 15 U/L (ref 14–54)
ANION GAP: 9 (ref 5–15)
AST: 25 U/L (ref 15–41)
Albumin: 3.3 g/dL — ABNORMAL LOW (ref 3.5–5.0)
Alkaline Phosphatase: 80 U/L (ref 38–126)
BUN: 9 mg/dL (ref 6–20)
CHLORIDE: 101 mmol/L (ref 101–111)
CO2: 25 mmol/L (ref 22–32)
Calcium: 8.8 mg/dL — ABNORMAL LOW (ref 8.9–10.3)
Creatinine, Ser: 0.82 mg/dL (ref 0.44–1.00)
GFR calc Af Amer: >60 mL/min (ref >60)
Glucose, Bld: 132 mg/dL — ABNORMAL HIGH (ref 65–99)
POTASSIUM: 4.5 mmol/L (ref 3.5–5.1)
Sodium: 135 mmol/L (ref 135–145)
Total Bilirubin: 0.2 mg/dL — ABNORMAL LOW (ref 0.3–1.2)
Total Protein: 6.6 g/dL (ref 6.5–8.1)

## 2016-05-17 LAB — RAPID URINE DRUG SCREEN, HOSP PERFORMED
Amphetamines: NOT DETECTED
BARBITURATES: NOT DETECTED
BENZODIAZEPINES: NOT DETECTED
COCAINE: NOT DETECTED
OPIATES: NOT DETECTED
Tetrahydrocannabinol: NOT DETECTED

## 2016-05-17 LAB — PROTIME-INR
INR: 1.11 (ref 0.00–1.49)
INR: 1.31 (ref 0.00–1.49)
PROTHROMBIN TIME: 14.5 s (ref 11.6–15.2)
PROTHROMBIN TIME: 16.4 s — AB (ref 11.6–15.2)

## 2016-05-17 LAB — ETHANOL

## 2016-05-17 LAB — CBG MONITORING, ED: GLUCOSE-CAPILLARY: 122 mg/dL — AB (ref 65–99)

## 2016-05-17 LAB — I-STAT CG4 LACTIC ACID, ED: Lactic Acid, Venous: 2.16 mmol/L (ref 0.5–1.9)

## 2016-05-17 MED ORDER — STROKE: EARLY STAGES OF RECOVERY BOOK: Freq: Once | Status: DC

## 2016-05-17 MED ORDER — DEXTROSE 5 % IV SOLN
1.0000 g | Freq: Once | INTRAVENOUS | Status: AC
  Administered 2016-05-17: 1 g via INTRAVENOUS
  Filled 2016-05-17: qty 10

## 2016-05-17 MED ORDER — POLYETHYLENE GLYCOL 3350 17 G PO PACK: 17.0000 g | PACK | Freq: Every day | ORAL | Status: DC | PRN

## 2016-05-17 MED ORDER — ONDANSETRON HCL 4 MG/2ML IJ SOLN: 4.0000 mg | Freq: Four times a day (QID) | INTRAMUSCULAR | Status: DC | PRN

## 2016-05-17 MED ORDER — LEVOTHYROXINE SODIUM 75 MCG PO TABS
150.0000 ug | ORAL_TABLET | Freq: Every day | ORAL | Status: DC
  Administered 2016-05-18 – 2016-05-19 (×2): 150 ug via ORAL
  Filled 2016-05-17: qty 2
  Filled 2016-05-17: qty 6

## 2016-05-17 MED ORDER — DONEPEZIL HCL 10 MG PO TABS
10.0000 mg | ORAL_TABLET | Freq: Every day | ORAL | Status: DC
  Administered 2016-05-18: 10 mg via ORAL
  Filled 2016-05-17: qty 1

## 2016-05-17 MED ORDER — NYSTATIN 100000 UNIT/GM EX CREA
1.0000 "application " | TOPICAL_CREAM | Freq: Two times a day (BID) | CUTANEOUS | Status: DC
  Administered 2016-05-18 – 2016-05-19 (×4): 1 via TOPICAL
  Filled 2016-05-17: qty 15

## 2016-05-17 MED ORDER — SODIUM CHLORIDE 0.9 % IV BOLUS (SEPSIS)
1000.0000 mL | Freq: Once | INTRAVENOUS | Status: AC
  Administered 2016-05-17: 1000 mL via INTRAVENOUS

## 2016-05-17 MED ORDER — ENOXAPARIN SODIUM 30 MG/0.3ML ~~LOC~~ SOLN
30.0000 mg | SUBCUTANEOUS | Status: DC
  Administered 2016-05-18 (×2): 30 mg via SUBCUTANEOUS
  Filled 2016-05-17 (×2): qty 0.3

## 2016-05-17 MED ORDER — LORAZEPAM 2 MG/ML IJ SOLN
0.5000 mg | Freq: Once | INTRAMUSCULAR | Status: AC
  Administered 2016-05-17: 0.5 mg via INTRAVENOUS
  Filled 2016-05-17: qty 1

## 2016-05-17 MED ORDER — PANTOPRAZOLE SODIUM 40 MG PO TBEC
40.0000 mg | DELAYED_RELEASE_TABLET | Freq: Every day | ORAL | Status: DC
  Administered 2016-05-19: 40 mg via ORAL
  Filled 2016-05-17: qty 1

## 2016-05-17 MED ORDER — CLOPIDOGREL BISULFATE 75 MG PO TABS: 75.0000 mg | ORAL_TABLET | Freq: Every day | ORAL | Status: DC

## 2016-05-17 MED ORDER — SERTRALINE HCL 50 MG PO TABS
25.0000 mg | ORAL_TABLET | Freq: Every day | ORAL | Status: DC
  Administered 2016-05-18 – 2016-05-19 (×2): 25 mg via ORAL
  Filled 2016-05-17 (×2): qty 1

## 2016-05-17 MED ORDER — SODIUM CHLORIDE 0.9 % IV SOLN
INTRAVENOUS | Status: DC
  Administered 2016-05-17 – 2016-05-18 (×3): via INTRAVENOUS

## 2016-05-17 MED ORDER — RISAQUAD PO CAPS
1.0000 | ORAL_CAPSULE | Freq: Every day | ORAL | Status: DC
  Administered 2016-05-18 – 2016-05-19 (×2): 1 via ORAL
  Filled 2016-05-17 (×2): qty 1

## 2016-05-17 MED ORDER — SENNA 8.6 MG PO TABS: 1.0000 | ORAL_TABLET | Freq: Every day | ORAL | Status: DC | PRN

## 2016-05-17 MED ORDER — GI COCKTAIL ~~LOC~~
30.0000 mL | Freq: Three times a day (TID) | ORAL | Status: DC | PRN
  Filled 2016-05-17: qty 30

## 2016-05-17 MED ORDER — SENNOSIDES-DOCUSATE SODIUM 8.6-50 MG PO TABS: 1.0000 | ORAL_TABLET | Freq: Every evening | ORAL | Status: DC | PRN

## 2016-05-17 MED ORDER — DEXTROSE 5 % IV SOLN
1.0000 g | INTRAVENOUS | Status: DC
  Administered 2016-05-18: 1 g via INTRAVENOUS
  Filled 2016-05-17 (×2): qty 10

## 2016-05-17 MED ORDER — IBUPROFEN 200 MG PO TABS: 400.0000 mg | ORAL_TABLET | Freq: Four times a day (QID) | ORAL | Status: DC | PRN

## 2016-05-17 MED ORDER — ACETAMINOPHEN 325 MG PO TABS: 650.0000 mg | ORAL_TABLET | ORAL | Status: DC | PRN

## 2016-05-17 MED ORDER — ASPIRIN 81 MG PO CHEW: 324.0000 mg | CHEWABLE_TABLET | Freq: Once | ORAL | Status: DC

## 2016-05-17 MED ORDER — ADULT MULTIVITAMIN W/MINERALS CH
1.0000 | ORAL_TABLET | Freq: Every day | ORAL | Status: DC
  Administered 2016-05-18 – 2016-05-19 (×2): 1 via ORAL
  Filled 2016-05-17 (×3): qty 1

## 2016-05-17 NOTE — ED Notes (Signed)
Pt from [facility] EMS reports pt fed herself breakfast at 0800 and at lunch pt had to be fed. EMS reports pt normally has right sided weakness from previous stroke.

## 2016-05-17 NOTE — ED Provider Notes (Signed)
CSN: [id number]     Arrival date & time 05/17/16  1449 History   First MD Initiated Contact with Patient 05/17/16 1453     Chief Complaint  Patient presents with  . Code Stroke     (Consider location/radiation/quality/duration/timing/severity/associated sxs/prior Treatment) HPI  A LEVEL 5 CAVEAT PERTAINS DUE TO ALTERED MENTAL STATUS/DEMENTIA.  Pt presenting as code stroke notification- she lives at a nursing facility and was thought to be more weak when eating lunch- needed assistance to eat- thought to have facial droop.  On arrival neurology saw patient and cancelled code stroke.  Pt is confused- oriented to person and place but answering questions with answers that do not make sense.  She states she feels fine.  She has hx of indwelling foley and recurrent UTIs.    Past Medical History  Diagnosis Date  . Alcohol abuse   . Arthritis   . Depression   . Stroke ([place])   . Thyroid disease     hypothyroid   Past Surgical History  Procedure Laterality Date  . Cholecystectomy  2008  . Tonsillectomy and adenoidectomy    . Abdominal hysterectomy      partial  . Thyroid surgery    . Total hip arthroplasty Left 09/24/2014    Procedure: TOTAL HIP ARTHROPLASTY ANTERIOR APPROACH, LEFT;  Surgeon: Mcarthur Rossetti, MD;  Location: [facility];  Service: Orthopedics;  Laterality: Left;   Family History  Problem Relation Age of Onset  . Arthritis Mother   . Hyperlipidemia Mother   . Stroke Mother   . Hypertension Mother   . Mental illness Mother   . Cancer Father     lung  . Cancer Sister     ovarian/uterine  . Stroke Sister   . Hypertension Sister   . Hypertension Brother   . Diabetes Brother   . Cancer Daughter     breast   Social History  Substance Use Topics  . Smoking status: Never Smoker   . Smokeless tobacco: Never Used  . Alcohol Use: No     Comment: used to drink    OB History    No data available     Review of Systems  UNABLE TO OBTAIN ROS DUE TO LEVEL 5  CAVEAT    Allergies  Citalopram hydrobromide; Sulfa antibiotics; and Ciprofloxacin  Home Medications   Prior to Admission medications   Medication Sig Start Date End Date Taking? Authorizing Provider  acetaminophen (TYLENOL) 325 MG tablet Take 325 mg by mouth every 6 (six) hours as needed for mild pain (pain).    Yes Historical Provider, MD  clopidogrel (PLAVIX) 75 MG tablet Take 75 mg by mouth daily.   Yes Historical Provider, MD  donepezil (ARICEPT) 10 MG tablet Take 1 tablet (10 mg total) by mouth at bedtime. 02/18/15  Yes Rebecca S Tat, DO  levothyroxine (SYNTHROID, LEVOTHROID) 150 MCG tablet Take 150 mcg by mouth daily before breakfast.   Yes Historical Provider, MD  Multiple Vitamin (MULTIVITAMIN) tablet Take 1 tablet by mouth daily.   Yes Historical Provider, MD  nystatin cream (MYCOSTATIN) Apply 1 application topically 2 (two) times daily.   Yes Historical Provider, MD  Probiotic Product (ACIDOPHILUS/GOAT MILK) CAPS Take 1 capsule by mouth daily.    Yes Historical Provider, MD  senna (SENOKOT) 8.6 MG TABS tablet Take 1 tablet (8.6 mg total) by mouth daily as needed for mild constipation. 09/28/14  Yes Nishant Dhungel, MD  sertraline (ZOLOFT) 25 MG tablet Take 25 mg by mouth  daily.   Yes Historical Provider, MD  Zinc Oxide 10 % OINT Apply 1 application topically 3 (three) times daily.   Yes Historical Provider, MD  levothyroxine (SYNTHROID, LEVOTHROID) 112 MCG tablet TAKE 1 TABLET (112 MCG TOTAL) BY MOUTH DAILY. Patient not taking: Reported on 05/17/2016    [name], MD   BP 111/51 mmHg  Pulse 57  Temp(Src) 98 F (36.7 C) (Axillary)  Resp 14  SpO2 97%  Vitals reviewed Physical Exam  Physical Examination: General appearance - alert, chronically ill appearing, and in no distress Mental status - alert, oriented to person, place, not to time Eyes - pupils equal and reactive, extraocular eye movements intact Mouth - mucous membranes moist, pharynx normal without  lesions Chest - clear to auscultation, no wheezes, rales or rhonchi, symmetric air entry Heart - normal rate, regular rhythm, normal S1, S2, no murmurs, rubs, clicks or gallops Abdomen - soft, nontender, nondistended, no masses or organomegaly Neurological - alert, oriented x 2, dysarthric/slurred speech, no facial droop, masked facies, answering questions- at times correctly- at times is talking about things that are not currently relevant, moving all extremities Extremities - peripheral pulses normal, no pedal edema, no clubbing or cyanosis Skin - normal coloration and turgor, no rashes  ED Course  Procedures (including critical care time)  CRITICAL CARE Performed by: Threasa Beards Total critical care time: 40 minutes Critical care time was exclusive of separately billable procedures and treating other patients. Critical care was necessary to treat or prevent imminent or life-threatening deterioration. Critical care was time spent personally by me on the following activities: development of treatment plan with patient and/or surrogate as well as nursing, discussions with consultants, evaluation of patient's response to treatment, examination of patient, obtaining history from patient or surrogate, ordering and performing treatments and interventions, ordering and review of laboratory studies, ordering and review of radiographic studies, pulse oximetry and re-evaluation of patient's condition. Labs Review Labs Reviewed  CBC - Abnormal; Notable for the following:    WBC 20.7 (*)    Hemoglobin 11.6 (*)    All other components within normal limits  DIFFERENTIAL - Abnormal; Notable for the following:    Neutro Abs 18.1 (*)    Monocytes Absolute 1.5 (*)    All other components within normal limits  COMPREHENSIVE METABOLIC PANEL - Abnormal; Notable for the following:    Glucose, Bld 132 (*)    Calcium 8.8 (*)    Albumin 3.3 (*)    Total Bilirubin 0.2 (*)    All other components within  normal limits  URINALYSIS, ROUTINE W REFLEX MICROSCOPIC (NOT AT [facility]) - Abnormal; Notable for the following:    APPearance TURBID (*)    Hgb urine dipstick LARGE (*)    Protein, ur 100 (*)    Nitrite POSITIVE (*)    Leukocytes, UA LARGE (*)    All other components within normal limits  URINE MICROSCOPIC-ADD ON - Abnormal; Notable for the following:    Squamous Epithelial / LPF 0-5 (*)    Bacteria, UA MANY (*)    All other components within normal limits  CBC - Abnormal; Notable for the following:    RBC 3.42 (*)    Hemoglobin 9.6 (*)    HCT 30.8 (*)    All other components within normal limits  BASIC METABOLIC PANEL - Abnormal; Notable for the following:    Calcium 8.0 (*)    All other components within normal limits  PROTIME-INR - Abnormal; Notable for the  following:    Prothrombin Time 16.4 (*)    All other components within normal limits  I-STAT CHEM 8, ED - Abnormal; Notable for the following:    Chloride 98 (*)    Glucose, Bld 129 (*)    Calcium, Ion 1.09 (*)    All other components within normal limits  I-STAT CG4 LACTIC ACID, ED - Abnormal; Notable for the following:    Lactic Acid, Venous 2.16 (*)    All other components within normal limits  CBG MONITORING, ED - Abnormal; Notable for the following:    Glucose-Capillary 122 (*)    All other components within normal limits  CULTURE, BLOOD (ROUTINE X 2)  CULTURE, BLOOD (ROUTINE X 2)  URINE CULTURE  ETHANOL  PROTIME-INR  APTT  URINE RAPID DRUG SCREEN, HOSP PERFORMED  TSH  LACTIC ACID, PLASMA  LIPID PANEL  APTT  TROPONIN I  HEMOGLOBIN A1C  VITAMIN B12  FOLATE  IRON AND TIBC  FERRITIN  RETICULOCYTES  I-STAT TROPOININ, ED  I-STAT CG4 LACTIC ACID, ED    Imaging Review Dg Chest 2 View  05/17/2016  CLINICAL DATA:  Altered mental status EXAM: CHEST  2 VIEW COMPARISON:  September 23, 2014 FINDINGS: There is mild left base atelectasis. There is no edema or consolidation. Heart size and pulmonary vascularity are  normal. No adenopathy. There is atherosclerotic calcification in the aortic arch region. There are multiple old healed rib fractures on the right. There is superior migration of both humeral heads with degenerative change in both shoulders. IMPRESSION: Mild left base atelectasis. No edema or consolidation. Aortic atherosclerosis noted. Chronic rotator cuff tears in both shoulders, characterized by superior migration of each humeral head. Multiple old healed rib fractures on the right. Electronically Signed   By: Lowella Grip III M.D.   On: 05/17/2016 15:44   Mr Brain Wo Contrast  05/17/2016  CLINICAL DATA:  Initial evaluation for acute right-sided weakness. EXAM: MRI HEAD WITHOUT CONTRAST MRA HEAD WITHOUT CONTRAST TECHNIQUE: Multiplanar, multiecho pulse sequences of the brain and surrounding structures were obtained without intravenous contrast. Angiographic images of the head were obtained using MRA technique without contrast. COMPARISON:  Prior CT from earlier the same day. FINDINGS: MRI HEAD FINDINGS Diffuse prominence of the CSF containing spaces is compatible with generalized age-related cerebral atrophy. Scattered patchy confluent T2/FLAIR hyperintensity within the periventricular and deep white matter both cerebral hemispheres most consistent with chronic small vessel ischemic disease, moderate in nature. Remote lacunar infarct within the left caudate head. Scattered foci susceptibility artifact present within the anterior left frontal lobe, consistent with chronic hemorrhagic blood products, which may related to remote ischemia or possibly trauma. These are stable relative to prior MRI from 2012. No other remote infarct. 11 mm focus of linear restricted diffusion within the left lentiform nucleus, consistent with an acute ischemic infarct (series 3, image 20). No associated mass effect or hemorrhage. No other acute infarction. Major intracranial vascular flow voids are maintained. Gray-white matter  differentiation otherwise preserved. No mass lesion, midline shift, or mass effect. Mild ventricular prominence related to global parenchymal volume loss without hydrocephalus. No extra-axial fluid collection. Major dural sinuses are grossly patent. Craniocervical junction within normal limits. Degenerative spondylolysis noted at the C3-4 level with mild canal stenosis. Pituitary gland within normal limits. No acute abnormality about the globes and orbits. Patient is status post lens extraction bilaterally. Scattered mucosal thickening within the ethmoidal air cells and maxillary sinuses. No air-fluid level to suggest active sinus infection. Trace opacity left  mastoid air cells. Inner ear structures grossly normal. Bone marrow signal intensity within normal limits. No scalp soft tissue abnormality. MRA HEAD FINDINGS ANTERIOR CIRCULATION: Study fairly degraded by motion artifact. Visualized distal cervical segments of the internal carotid arteries are patent with antegrade flow. Focal apparent stenoses at the bilateral petrous-cervical junction of the internal reason is favored to be artifactual in nature given the symmetric nature of these findings. Additionally, there is motion artifact through this portion of the exam. Petrous segments otherwise widely patent. Scattered atheromatous irregularity within the cavernous/supraclinoid ICAs bilaterally without flow-limiting stenosis. A1 segments demonstrate multi focal irregularity but are widely patent without significant stenosis. Anterior communicating artery within normal limits. Anterior cerebral arteries well opacified. Focal short-segment moderate stenosis within the mid left M1 segment (series 453, image 8). M1 segment patent distally. Left MCA bifurcation normal. Proximal left M2 stenosis and attenuation. Left MCA branches are well opacified distally but demonstrate multifocal atheromatous irregularity. Right M1 segment patent to its distal aspect without  stenosis or occlusion. There is fairly severe stenosis at the right MCA bifurcation extending into the proximal right M2 branches. This appears to be nonocclusive with flow related opacification within the right MCA branches distally. Right MCA branches also demonstrate multifocal atheromatous irregularity. POSTERIOR CIRCULATION: Vertebral arteries largely code dominant and patent to the vertebrobasilar junction. Posterior inferior cerebral arteries patent proximally. Basilar artery patent without stenosis or occlusion. Basilar tip is ectatic without focal aneurysm. Superior cerebellar arteries patent bilaterally, although there is a probable moderate to severe stenosis at the origin of the left SCA. Both of the posterior cerebral arteries arise knee basilar artery. Focal mild stenoses at the proximal P1 segments. PCAs demonstrate atheromatous irregularity distally, left greater than right, but are opacified to their distal aspects. No aneurysm or vascular malformation. IMPRESSION: MRI HEAD IMPRESSION: 1. 11 mm acute ischemic nonhemorrhagic lacunar type infarction within the left lentiform nucleus. No associated mass effect. 2. Remote lacunar infarct within the left caudate head. 3. Generalized age-related cerebral atrophy with moderate chronic small vessel ischemic disease. MRA HEAD IMPRESSION: 1. No proximal or large vessel occlusion within the intracranial circulation. 2. Focal short segment moderate mid left M1 segment stenosis. 3. Moderate to severe nonocclusive stenoses with attenuation involving the proximal M2 segments bilaterally, right worse than left. Patent flow within the MCA branches distally. 4. Additional multifocal atheromatous disease involving the posterior circulation as above without high-grade flow-limiting stenosis. 5. Distal small vessel regularity involving the MCA and PCA branches bilaterally. Electronically Signed   By: Jeannine Boga M.D.   On: 05/17/2016 22:56   Mr Jodene Nam Head/brain  Wo Cm  05/17/2016  CLINICAL DATA:  Initial evaluation for acute right-sided weakness. EXAM: MRI HEAD WITHOUT CONTRAST MRA HEAD WITHOUT CONTRAST TECHNIQUE: Multiplanar, multiecho pulse sequences of the brain and surrounding structures were obtained without intravenous contrast. Angiographic images of the head were obtained using MRA technique without contrast. COMPARISON:  Prior CT from earlier the same day. FINDINGS: MRI HEAD FINDINGS Diffuse prominence of the CSF containing spaces is compatible with generalized age-related cerebral atrophy. Scattered patchy confluent T2/FLAIR hyperintensity within the periventricular and deep white matter both cerebral hemispheres most consistent with chronic small vessel ischemic disease, moderate in nature. Remote lacunar infarct within the left caudate head. Scattered foci susceptibility artifact present within the anterior left frontal lobe, consistent with chronic hemorrhagic blood products, which may related to remote ischemia or possibly trauma. These are stable relative to prior MRI from 2012. No other remote infarct. 11 mm  focus of linear restricted diffusion within the left lentiform nucleus, consistent with an acute ischemic infarct (series 3, image 20). No associated mass effect or hemorrhage. No other acute infarction. Major intracranial vascular flow voids are maintained. Gray-white matter differentiation otherwise preserved. No mass lesion, midline shift, or mass effect. Mild ventricular prominence related to global parenchymal volume loss without hydrocephalus. No extra-axial fluid collection. Major dural sinuses are grossly patent. Craniocervical junction within normal limits. Degenerative spondylolysis noted at the C3-4 level with mild canal stenosis. Pituitary gland within normal limits. No acute abnormality about the globes and orbits. Patient is status post lens extraction bilaterally. Scattered mucosal thickening within the ethmoidal air cells and maxillary  sinuses. No air-fluid level to suggest active sinus infection. Trace opacity left mastoid air cells. Inner ear structures grossly normal. Bone marrow signal intensity within normal limits. No scalp soft tissue abnormality. MRA HEAD FINDINGS ANTERIOR CIRCULATION: Study fairly degraded by motion artifact. Visualized distal cervical segments of the internal carotid arteries are patent with antegrade flow. Focal apparent stenoses at the bilateral petrous-cervical junction of the internal reason is favored to be artifactual in nature given the symmetric nature of these findings. Additionally, there is motion artifact through this portion of the exam. Petrous segments otherwise widely patent. Scattered atheromatous irregularity within the cavernous/supraclinoid ICAs bilaterally without flow-limiting stenosis. A1 segments demonstrate multi focal irregularity but are widely patent without significant stenosis. Anterior communicating artery within normal limits. Anterior cerebral arteries well opacified. Focal short-segment moderate stenosis within the mid left M1 segment (series 453, image 8). M1 segment patent distally. Left MCA bifurcation normal. Proximal left M2 stenosis and attenuation. Left MCA branches are well opacified distally but demonstrate multifocal atheromatous irregularity. Right M1 segment patent to its distal aspect without stenosis or occlusion. There is fairly severe stenosis at the right MCA bifurcation extending into the proximal right M2 branches. This appears to be nonocclusive with flow related opacification within the right MCA branches distally. Right MCA branches also demonstrate multifocal atheromatous irregularity. POSTERIOR CIRCULATION: Vertebral arteries largely code dominant and patent to the vertebrobasilar junction. Posterior inferior cerebral arteries patent proximally. Basilar artery patent without stenosis or occlusion. Basilar tip is ectatic without focal aneurysm. Superior cerebellar  arteries patent bilaterally, although there is a probable moderate to severe stenosis at the origin of the left SCA. Both of the posterior cerebral arteries arise knee basilar artery. Focal mild stenoses at the proximal P1 segments. PCAs demonstrate atheromatous irregularity distally, left greater than right, but are opacified to their distal aspects. No aneurysm or vascular malformation. IMPRESSION: MRI HEAD IMPRESSION: 1. 11 mm acute ischemic nonhemorrhagic lacunar type infarction within the left lentiform nucleus. No associated mass effect. 2. Remote lacunar infarct within the left caudate head. 3. Generalized age-related cerebral atrophy with moderate chronic small vessel ischemic disease. MRA HEAD IMPRESSION: 1. No proximal or large vessel occlusion within the intracranial circulation. 2. Focal short segment moderate mid left M1 segment stenosis. 3. Moderate to severe nonocclusive stenoses with attenuation involving the proximal M2 segments bilaterally, right worse than left. Patent flow within the MCA branches distally. 4. Additional multifocal atheromatous disease involving the posterior circulation as above without high-grade flow-limiting stenosis. 5. Distal small vessel regularity involving the MCA and PCA branches bilaterally. Electronically Signed   By: Jeannine Boga M.D.   On: 05/17/2016 22:56   Ct Head Code Stroke W/o Cm  05/17/2016  CLINICAL DATA:  Code stroke for slurred speech and facial droop. EXAM: CT HEAD WITHOUT CONTRAST TECHNIQUE: Contiguous  axial images were obtained from the base of the skull through the vertex without intravenous contrast. COMPARISON:  01/29/2015 FINDINGS: Brain: No evidence of acute infarction, hemorrhage, hydrocephalus, or mass lesion/mass effect. Stable patchy microvascular ischemic gliosis in the bilateral cerebral white matter. Stable patchy low-density in the bilateral thalamus and left caudate. Accounting for chronic changes ASPCETS is 10. Mild to moderate  for age generalized atrophy. Vascular: No hyperdense vessel. Skull: Negative for fracture or focal lesion. Sinuses/Orbits: Bilateral cataract resection.  No acute finding. Other: These results were called by telephone at the time of interpretation on 05/17/2016 at 3:04 pm to Dr. Cristobal Goldmann, who verbally acknowledged these results. IMPRESSION: 1. No acute finding or change from 2016. 2. Moderate chronic microvascular disease. Electronically Signed   By: Monte Fantasia M.D.   On: 05/17/2016 15:05   I have personally reviewed and evaluated these images and lab results as part of my medical decision-making.   EKG Interpretation   Date/Time:  Thursday May 17 2016 15:10:15 EDT Ventricular Rate:  81 PR Interval:    QRS Duration: 147 QT Interval:  424 QTC Calculation: 493 R Axis:   76 Text Interpretation:  Sinus rhythm IVCD, consider atypical RBBB No  significant change since last tracing Confirmed by [facility]  MD, Anayla Giannetti  [id number]) on 05/17/2016 3:21:46 PM      MDM   Final diagnoses:  Encephalopathy acute  Encephalopathy acute  Encephalopathy acute    Pt presenting with c/o altered mental status- concern for possible stroke- code stroke cancelled by neurology.  Pt also with leukocytosis and encepholopathic so although foley is contaminated suspect she does have UTI at this point.  Head CT reassuring- nothing acute.  Pt to be admitted to triad for IV rocephin, fluids, cultures pending and to continue with stroke workup.    3:11 PM neurology has seen patient and is cancelling code stroke.  Will continue with workup.   5:13 PM d/w Dr. Grandville Silos, triad hospitalist- pt to be admitted for UTI as well as further stroke workup.  Blood and urine cultures obtained.  Pt has only 1 sirs criteria- so am not initiating code sepsis at this time. Started on iv fluids and abx.    Alfonzo Beers, MD 05/18/16 1037

## 2016-05-17 NOTE — Code Documentation (Signed)
82yo female arriving to [facility] via [place] at 76.  Patient from nursing facility where she was LKW at 0800 when she ate breakfast.  Staff had to feed her lunch and then she had a progressive decline.  Patient with h/o dementia and stroke with residual mild right sided weakness.  EMS was called and assessed right facial droop, right sided weakness and slurred speech and activated a code stroke.  Stroke team at the bedside on patient arrival.  Labs drawn and patient transported to CT.  CT completed.  Patient to E39.  NIHSS 9, see documentation for details and code stroke times.  Patient with generalized weakness, unable to answer questions and moderate dysarthria.  Patient is outside the window for treatment with tPA.  No acute stroke treatment at this time per Dr. Hollice Gong.  Bedside handoff with ED RN Cricket.

## 2016-05-17 NOTE — H&P (Signed)
History and Physical    [name] [id number] DOB: 1931-04-06 DOA: 05/17/2016  PCP: [name], MD Patient coming from: memory care unit  At [facility]  Chief Complaint: right facial droop/right-sided weakness/lethargy/code stroke.  HPI: [name] is a 82 y.o. female with medical history significant of prior history of stroke, history of alcohol abuse, severe dementia, hypothyroidism, depression,, urinary retention status post chronic Foley catheter, history of recurrent UTIs presented to the ED with sudden onset right facial droop with right-sided weakness noted a nursing home with concern for stroke. Patient with some dysarthric speech and unintelligible speech and a such history was obtained from the chart  And per daughter's. It was noted that patient was last seen normal around 8 AM on the morning of admission which was noted to fit SL for breakfast. It was noted that patient became very weak and lethargic and fatigued to the point where she could not pick up any food around lunchtime. Nurse at the memory care unit assessed the patient and felt patient may have had a right facial droop,slurred speech and right-sided weakness and a such EMS was called and patient was brought to the ED as a code stroke. The patient and per family denying any recent fevers, no chills, no nausea, no vomiting, no chest pain, no shortness of breath, no abdominal pain, no hematemesis, no hematochezia, no melena, no cough. Patient does endorse some dysuria. The family patient with a weak cough. Patient with no other associated symptoms.  ED Course: on arrival to the ED code stroke was called neurology assessed the patient and patient was not noted to have any right facial droop or increased right-sided weakness and a such code stroke was canceled.Patient was notedhowever to be confused with some dysarthric speech was recommended per neurology the patient be admitted for stroke  workup and further evaluation. Lab work obtained in the [employer] a CBC with a leukocytosis white count of 20.7 hemoglobin of 11.6 otherwise is within normal limits. Lactic acid slightly elevated at 2.16. Compressive metabolic profile with albumin of 3.3 otherwise was unremarkable. Chest x-ray is negative for any acute infiltrates. CT head was negative for any acute infarct.EKG with atypical right bundle branch block. Patient was given an aspirin. Urinalysis which was donewas noted to have large hemoglobin large leukocytes and positive nitrites too numerous to count WBCs. Foley catheter also noted to have sediment in there.patient was given a dose of IV Rocephin.  Hospitalists were called to admit the patient for further evaluation and management.  Review of Systems: As per HPI otherwise 10 point review of systems negative.  Past Medical History  Diagnosis Date  . Alcohol abuse   . Arthritis   . Depression   . Stroke ([place])   . Thyroid disease     hypothyroid    Past Surgical History  Procedure Laterality Date  . Cholecystectomy  2008  . Tonsillectomy and adenoidectomy    . Abdominal hysterectomy      partial  . Thyroid surgery    . Total hip arthroplasty Left 09/24/2014    Procedure: TOTAL HIP ARTHROPLASTY ANTERIOR APPROACH, LEFT;  Surgeon: Mcarthur Rossetti, MD;  Location: [facility];  Service: Orthopedics;  Laterality: Left;     reports that she has never smoked. She has never used smokeless tobacco. She reports that she does not drink alcohol or use illicit drugs.  Allergies  Allergen Reactions  . Citalopram Hydrobromide     Shaky inside  .  Sulfa Antibiotics     rash  . Ciprofloxacin Rash    Family History  Problem Relation Age of Onset  . Arthritis Mother   . Hyperlipidemia Mother   . Stroke Mother   . Hypertension Mother   . Mental illness Mother   . Cancer Father     lung  . Cancer Sister     ovarian/uterine  . Stroke Sister   . Hypertension Sister   .  Hypertension Brother   . Diabetes Brother   . Cancer Daughter     breast   Mother deceased age 32 from a stroke. Father deceased age 65 from lung cancer. Patient states able to ambulate have a mostly in the wheelchair per family. Patient with history of severe dementia and memory care unit.  Prior to Admission medications   Medication Sig Start Date End Date Taking? Authorizing Provider  acetaminophen (TYLENOL) 325 MG tablet Take 325 mg by mouth every 6 (six) hours as needed for mild pain (pain).    Yes Historical Provider, MD  clopidogrel (PLAVIX) 75 MG tablet Take 75 mg by mouth daily.   Yes Historical Provider, MD  donepezil (ARICEPT) 10 MG tablet Take 1 tablet (10 mg total) by mouth at bedtime. 02/18/15  Yes Rebecca S Tat, DO  levothyroxine (SYNTHROID, LEVOTHROID) 150 MCG tablet Take 150 mcg by mouth daily before breakfast.   Yes Historical Provider, MD  Multiple Vitamin (MULTIVITAMIN) tablet Take 1 tablet by mouth daily.   Yes Historical Provider, MD  nystatin cream (MYCOSTATIN) Apply 1 application topically 2 (two) times daily.   Yes Historical Provider, MD  Probiotic Product (ACIDOPHILUS/GOAT MILK) CAPS Take 1 capsule by mouth daily.    Yes Historical Provider, MD  senna (SENOKOT) 8.6 MG TABS tablet Take 1 tablet (8.6 mg total) by mouth daily as needed for mild constipation. 09/28/14  Yes Nishant Dhungel, MD  sertraline (ZOLOFT) 25 MG tablet Take 25 mg by mouth daily.   Yes Historical Provider, MD  Zinc Oxide 10 % OINT Apply 1 application topically 3 (three) times daily.   Yes Historical Provider, MD  levothyroxine (SYNTHROID, LEVOTHROID) 112 MCG tablet TAKE 1 TABLET (112 MCG TOTAL) BY MOUTH DAILY. Patient not taking: Reported on 05/17/2016    [name], MD    Physical Exam: Filed Vitals:   05/17/16 1651 05/17/16 1709 05/17/16 1715 05/17/16 1731  BP:  132/64 132/56 125/52  Pulse:  90 87 86  Temp: 99.3 F (37.4 C)     TempSrc: Rectal     Resp:  15 15 13   SpO2:  100% 100%  100%      Constitutional: NAD, calm, comfortable. Mask like face. Filed Vitals:   05/17/16 1651 05/17/16 1709 05/17/16 1715 05/17/16 1731  BP:  132/64 132/56 125/52  Pulse:  90 87 86  Temp: 99.3 F (37.4 C)     TempSrc: Rectal     Resp:  15 15 13   SpO2:  100% 100% 100%   Eyes: patient refuses to open her eyes to be examined. ENMT: Mucous membranes are dry. Unable to assess as patient does not open her mouth. Neck: normal, supple, no masses, no thyromegaly Respiratory: clear to auscultation bilaterally, no wheezing, no crackles. Normal respiratory effort. No accessory muscle use.  Cardiovascular: Regular rate and rhythm, no murmurs / rubs / gallops. No extremity edema. 2+ pedal pulses. No carotid bruits.  Abdomen: no tenderness, no masses palpated. No hepatosplenomegaly. Bowel sounds positive.  Musculoskeletal: no clubbing / cyanosis. No joint  deformity upper and lower extremities. 5/5 LUE strength. 4/5 RUE strength. 3/5 BLE strength. Skin: no rashes, lesions, ulcers. No induration Neurologic: unable to fully assess neurological exam as patient not fully compliant. Patient with significantly decreased strength in bilateral lower extremities. Unable to obtain reflexes symmetrically and diffusely. Psychiatric: poor insight. Poor judgment. Alert. Unable to assess mood. Masklike face.  Labs on Admission: I have personally reviewed following labs and imaging studies  CBC:  Recent Labs Lab 05/17/16 1455 05/17/16 1500  WBC 20.7*  --   NEUTROABS 18.1*  --   HGB 11.6* 12.9  HCT 36.6 38.0  MCV 88.4  --   PLT 362  --    Basic Metabolic Panel:  Recent Labs Lab 05/17/16 1455 05/17/16 1500  NA 135 136  K 4.5 4.6  CL 101 98*  CO2 25  --   GLUCOSE 132* 129*  BUN 9 13  CREATININE 0.82 0.80  CALCIUM 8.8*  --    GFR: CrCl cannot be calculated (Unknown ideal weight.). Liver Function Tests:  Recent Labs Lab 05/17/16 1455  AST 25  ALT 15  ALKPHOS 80  BILITOT 0.2*  PROT  6.6  ALBUMIN 3.3*   No results for input(s): LIPASE, AMYLASE in the last 168 hours. No results for input(s): AMMONIA in the last 168 hours. Coagulation Profile:  Recent Labs Lab 05/17/16 1455  INR 1.11   Cardiac Enzymes: No results for input(s): CKTOTAL, CKMB, CKMBINDEX, TROPONINI in the last 168 hours. BNP (last 3 results) No results for input(s): PROBNP in the last 8760 hours. HbA1C: No results for input(s): HGBA1C in the last 72 hours. CBG:  Recent Labs Lab 05/17/16 1525  GLUCAP 122*   Lipid Profile: No results for input(s): CHOL, HDL, LDLCALC, TRIG, CHOLHDL, LDLDIRECT in the last 72 hours. Thyroid Function Tests: No results for input(s): TSH, T4TOTAL, FREET4, T3FREE, THYROIDAB in the last 72 hours. Anemia Panel: No results for input(s): VITAMINB12, FOLATE, FERRITIN, TIBC, IRON, RETICCTPCT in the last 72 hours. Urine analysis:    Component Value Date/Time   COLORURINE YELLOW 05/17/2016 1520   APPEARANCEUR TURBID* 05/17/2016 1520   LABSPEC 1.016 05/17/2016 1520   PHURINE 6.5 05/17/2016 1520   GLUCOSEU NEGATIVE 05/17/2016 1520   HGBUR LARGE* 05/17/2016 1520   BILIRUBINUR NEGATIVE 05/17/2016 1520   BILIRUBINUR 1+ 01/05/2015 1145   KETONESUR NEGATIVE 05/17/2016 1520   PROTEINUR 100* 05/17/2016 1520   PROTEINUR 3+ 01/05/2015 1145   UROBILINOGEN 0.2 01/05/2015 1145   UROBILINOGEN 1.0 09/26/2014 1802   NITRITE POSITIVE* 05/17/2016 1520   NITRITE neg 01/05/2015 1145   LEUKOCYTESUR LARGE* 05/17/2016 1520   Sepsis Labs: !!!!!!!!!!!!!!!!!!!!!!!!!!!!!!!!!!!!!!!!!!!! @LABRCNTIP (procalcitonin:4,lacticidven:4) )No results found for this or any previous visit (from the past 240 hour(s)).   Radiological Exams on Admission: Dg Chest 2 View  05/17/2016  CLINICAL DATA:  Altered mental status EXAM: CHEST  2 VIEW COMPARISON:  September 23, 2014 FINDINGS: There is mild left base atelectasis. There is no edema or consolidation. Heart size and pulmonary vascularity are normal. No  adenopathy. There is atherosclerotic calcification in the aortic arch region. There are multiple old healed rib fractures on the right. There is superior migration of both humeral heads with degenerative change in both shoulders. IMPRESSION: Mild left base atelectasis. No edema or consolidation. Aortic atherosclerosis noted. Chronic rotator cuff tears in both shoulders, characterized by superior migration of each humeral head. Multiple old healed rib fractures on the right. Electronically Signed   By: Lowella Grip III M.D.   On: 05/17/2016  15:44   Ct Head Code Stroke W/o Cm  05/17/2016  CLINICAL DATA:  Code stroke for slurred speech and facial droop. EXAM: CT HEAD WITHOUT CONTRAST TECHNIQUE: Contiguous axial images were obtained from the base of the skull through the vertex without intravenous contrast. COMPARISON:  01/29/2015 FINDINGS: Brain: No evidence of acute infarction, hemorrhage, hydrocephalus, or mass lesion/mass effect. Stable patchy microvascular ischemic gliosis in the bilateral cerebral white matter. Stable patchy low-density in the bilateral thalamus and left caudate. Accounting for chronic changes ASPCETS is 10. Mild to moderate for age generalized atrophy. Vascular: No hyperdense vessel. Skull: Negative for fracture or focal lesion. Sinuses/Orbits: Bilateral cataract resection.  No acute finding. Other: These results were called by telephone at the time of interpretation on 05/17/2016 at 3:04 pm to Dr. Cristobal Goldmann, who verbally acknowledged these results. IMPRESSION: 1. No acute finding or change from 2016. 2. Moderate chronic microvascular disease. Electronically Signed   By: Monte Fantasia M.D.   On: 05/17/2016 15:05    EKG: Independently reviewed. Atypical right bundle branch block  Assessment/Plan Principal Problem:   Encephalopathy acute Active Problems:   Hypothyroid   Depression   Hyperlipemia   Cognitive impairment   Constipation   Vascular dementia   Urinary retention    Severe dementia   Encephalopathy   Acute UTI   Leukocytosis   Dehydration   H/O: CVA (cerebrovascular accident)   UTI (urinary tract infection) due to urinary indwelling Foley catheter ([place])   Acute encephalopathy      #1 acute encephalopathy Patient had presented with acute encephalopathy with lethargy, weakness, and per nursing home patient was noted  To have a right facial droop and right-sided weakness with some slurred speech. On exam no facial droop is noted. Patient does have some dysarthric/slurred speech.Patient with significant bilateral lower extremity weakness. Patient noted per urinalysis to be consistent with a UTI.  CT head which was out was negative.  Code stroke was initially called and canceled per neurology. Patient also noted to have a leukocytosis. Patient has been pan cultured results pending. Chest x-ray is negative.  Hydrated IV fluids. Patient will undergo a stroke workup including carotid Dopplers, MRI MRA head, 2-D echo, tsh, hemoglobin A1c, fasting lipid panel, PT/OT/ST. Patient outside TPA window and as such this was not given. Patient was supposed to be on Plavix per med rec and per daughter and as such will place back on Plavix daily. Will also place patient empirically on IV Rocephin, IV fluids, follow.  #2 urinary tract infection associated with chronic indwelling Foley catheter Urine Foley catheter bag with significant sediment with some auditory to it. Urinalysis worrisome for UTI. Check urine cultures. Change Foley catheter. Placed empirically on IV Rocephin. Follow.  #3 leukocytosis Likely secondary to UTI. Blood cultures pending. Chest x-ray negative for any acute infiltrate. Lactic acid was slightly elevated. Continue empiric Rocephin. Follow.  #4 hypothyroidism Check a TSH. Continue home dose Synthroid.  #5 dehydration  IV fluids.  #6 chronic urinary retention Change out Foley catheter.  #7 depression Continue home regimen of Zoloft.   #8  dementia Continue home regimen of Namenda.   DVT prophylaxis: Lovenox Code Status: ffull Family Communication: updated patient and daughters at bedside. Disposition Plan: back to memory care unit once workup is completed and clinical improvement. Consults called: neurology. Admission status: Admit to neuro telemetry   [facility] MD [facility] Pager [phone]  If 7PM-7AM, please contact night-coverage [email or web address] Password [facility]  05/17/2016, 6:15 PM

## 2016-05-17 NOTE — ED Provider Notes (Signed)
MSE was initiated and I personally evaluated the patient and placed orders (if any) at  2:52 PM on May 17, 2016.  The patient appears stable so that the remainder of the MSE may be completed by another provider.  Met patient at bridge for code stroke. Protecting airway and breathing spontaneously. Has grip strength bilaterally. Cleared for CT  Davonna Belling, MD 05/17/16 1452

## 2016-05-17 NOTE — Consult Note (Signed)
Requesting Physician: Dr. Canary Brim    Chief Complaint: Code stroke    HPI:                                                                                                                                         [name] is an 82 y.o. female Pt from [facility] EMS reports pt fed herself breakfast at 0800 and at lunch pt had to be fed. EMS reports pt normally has right sided weakness from previous stroke. On arrival patient cannot give history. She keeps eyes closed and shows no weakness on either side. She has a very masked face with no facial droop. She is able to name objects.   Date last known well: Today Time last known well: Time: 08:00 tPA Given: No: minimal symptoms and out of window   Past Medical History  Diagnosis Date  . Alcohol abuse   . Arthritis   . Depression   . Stroke ([place])   . Thyroid disease     hypothyroid    Past Surgical History  Procedure Laterality Date  . Cholecystectomy  2008  . Tonsillectomy and adenoidectomy    . Abdominal hysterectomy      partial  . Thyroid surgery    . Total hip arthroplasty Left 09/24/2014    Procedure: TOTAL HIP ARTHROPLASTY ANTERIOR APPROACH, LEFT;  Surgeon: Mcarthur Rossetti, MD;  Location: [facility];  Service: Orthopedics;  Laterality: Left;    Family History  Problem Relation Age of Onset  . Arthritis Mother   . Hyperlipidemia Mother   . Stroke Mother   . Hypertension Mother   . Mental illness Mother   . Cancer Father     lung  . Cancer Sister     ovarian/uterine  . Stroke Sister   . Hypertension Sister   . Hypertension Brother   . Diabetes Brother   . Cancer Daughter     breast   Social History:  reports that she has never smoked. She has never used smokeless tobacco. She reports that she does not drink alcohol or use illicit drugs.  Allergies:  Allergies  Allergen Reactions  . Citalopram Hydrobromide     Shaky inside  . Sulfa Antibiotics     rash  . Ciprofloxacin Rash     Medications:  No current facility-administered medications for this encounter.   Current Outpatient Prescriptions  Medication Sig Dispense Refill  . acetaminophen (TYLENOL) 325 MG tablet Take 325 mg by mouth every 6 (six) hours as needed for mild pain (pain).     . Calcium Carb-Cholecalciferol 600-800 MG-UNIT TABS Take 1 tablet by mouth daily.    Marland Kitchen donepezil (ARICEPT) 10 MG tablet Take 1 tablet (10 mg total) by mouth at bedtime. 30 tablet 11  . levothyroxine (SYNTHROID, LEVOTHROID) 112 MCG tablet TAKE 1 TABLET (112 MCG TOTAL) BY MOUTH DAILY. 30 tablet 11  . Multiple Vitamin (MULTIVITAMIN) tablet Take 1 tablet by mouth daily.    . Probiotic Product (ACIDOPHILUS/GOAT MILK) CAPS Take 1 capsule by mouth daily.     Marland Kitchen senna (SENOKOT) 8.6 MG TABS tablet Take 1 tablet (8.6 mg total) by mouth daily as needed for mild constipation. (Patient not taking: Reported on 02/18/2015) 30 each 0  . sertraline (ZOLOFT) 25 MG tablet Take 25 mg by mouth daily.       ROS:                                                                                                                                       History obtained from unobtainable from patient due to mental status    Neurologic Examination:                                                                                                      There were no vitals taken for this visit.  HEENT-  Normocephalic, no lesions, without obvious abnormality.  Normal external eye and conjunctiva.  Normal TM's bilaterally.  Normal auditory canals and external ears. Normal external nose, mucus membranes and septum.  Normal pharynx. Cardiovascular- S1, S2 normal, pulses palpable throughout   Lungs- chest clear, no wheezing, rales, normal symmetric air entry Abdomen- normal findings: bowel sounds normal Extremities- no edema Lymph-no adenopathy  palpable Musculoskeletal-no joint tenderness, deformity or swelling Skin-warm and dry, no hyperpigmentation, vitiligo, or suspicious lesions  Neurological Examination Mental Status: Alert, not oriented.  Speech dysarthric but she is not fully opening her mouth without evidence of aphasia. difficulty follow 3 step commands without difficulty. Cranial Nerves: II: vision intact but no blink to threat bilaterally, pupils equal, round, reactive to light and accommodation III,IV, VI: ptosis  Present on the left and tends to hold eyelids shut unless asked to open them, extra-ocular motions intact bilaterally V,VII: face symmetric, facial light touch sensation normal bilaterally  VIII: hearing normal bilaterally IX,X: uvula rises symmetrically XI: bilateral shoulder shrug XII: midline tongue extension Motor: Moving all extremities antigravity 3/5 with increased tone throughout.  Sensory: Pinprick and light touch intact throughout, bilaterally Deep Tendon Reflexes: 1+ and symmetric throughout UE no LE DTR Plantars: Mute bilaterally Cerebellar: Unable to assess Gait: not tested       Lab Results: Basic Metabolic Panel:  Recent Labs Lab 05/17/16 1500  NA 136  K 4.6  CL 98*  GLUCOSE 129*  BUN 13  CREATININE 0.80    Liver Function Tests: No results for input(s): AST, ALT, ALKPHOS, BILITOT, PROT, ALBUMIN in the last 168 hours. No results for input(s): LIPASE, AMYLASE in the last 168 hours. No results for input(s): AMMONIA in the last 168 hours.  CBC:  Recent Labs Lab 05/17/16 1500  HGB 12.9  HCT 38.0    Cardiac Enzymes: No results for input(s): CKTOTAL, CKMB, CKMBINDEX, TROPONINI in the last 168 hours.  Lipid Panel: No results for input(s): CHOL, TRIG, HDL, CHOLHDL, VLDL, LDLCALC in the last 168 hours.  CBG: No results for input(s): GLUCAP in the last 168 hours.  Microbiology: Results for orders placed or performed in visit on 01/05/15  Culture, Urine     Status:  None   Collection Time: 01/05/15 11:57 AM  Result Value Ref Range Status   Colony Count >=100,000 COLONIES/ML  Final   Organism ID, Bacteria Multiple bacterial morphotypes present, none  Final   Organism ID, Bacteria predominant. Suggest appropriate recollection if   Final   Organism ID, Bacteria clinically indicated.  Final    Coagulation Studies: No results for input(s): LABPROT, INR in the last 72 hours.  Imaging: Ct Head Code Stroke W/o Cm  05/17/2016  CLINICAL DATA:  Code stroke for slurred speech and facial droop. EXAM: CT HEAD WITHOUT CONTRAST TECHNIQUE: Contiguous axial images were obtained from the base of the skull through the vertex without intravenous contrast. COMPARISON:  01/29/2015 FINDINGS: Brain: No evidence of acute infarction, hemorrhage, hydrocephalus, or mass lesion/mass effect. Stable patchy microvascular ischemic gliosis in the bilateral cerebral white matter. Stable patchy low-density in the bilateral thalamus and left caudate. Accounting for chronic changes ASPCETS is 10. Mild to moderate for age generalized atrophy. Vascular: No hyperdense vessel. Skull: Negative for fracture or focal lesion. Sinuses/Orbits: Bilateral cataract resection.  No acute finding. Other: These results were called by telephone at the time of interpretation on 05/17/2016 at 3:04 pm to Dr. Cristobal Goldmann, who verbally acknowledged these results. IMPRESSION: 1. No acute finding or change from 2016. 2. Moderate chronic microvascular disease. Electronically Signed   By: Monte Fantasia M.D.   On: 05/17/2016 15:05       Assessment and plan discussed with with attending physician and they are in agreement.    Etta Quill PA-C [facility] [phone]  05/17/2016, 3:10 PM   Assessment: 82 y.o. female with reported right sided weakness and facial droop. Exam in ED showed no weakness or facial droop. She was slow to respond and had increased tone throughout. Given reported findings would  evaluate for CVA or TIA.   Stroke Risk Factors - none  Recommend 1. HgbA1c, fasting lipid panel 2. MRI, MRA  of the brain without contrast 3. PT consult, OT consult, Speech consult 4. Echocardiogram 5. Carotid dopplers 6. Prophylactic therapy-Antiplatelet med: Aspirin - dose 325 mg daily 7. Risk factor modification 8. Telemetry monitoring 9. Frequent neuro checks 10 NPO until passes stroke swallow screen 11 please page stroke NP  Or  PA  Or MD from 8am -4 pm  as this patient from this time will be  followed by the stroke.   You can look them up on [email or web address]  Password [email or web address]

## 2016-05-18 DIAGNOSIS — I639 Cerebral infarction, unspecified: Secondary | ICD-10-CM | POA: Diagnosis present

## 2016-05-18 DIAGNOSIS — T83511D Infection and inflammatory reaction due to indwelling urethral catheter, subsequent encounter: Secondary | ICD-10-CM

## 2016-05-18 DIAGNOSIS — E538 Deficiency of other specified B group vitamins: Secondary | ICD-10-CM | POA: Diagnosis present

## 2016-05-18 DIAGNOSIS — I63032 Cerebral infarction due to thrombosis of left carotid artery: Secondary | ICD-10-CM

## 2016-05-18 LAB — BASIC METABOLIC PANEL
ANION GAP: 5 (ref 5–15)
BUN: 7 mg/dL (ref 6–20)
CALCIUM: 8 mg/dL — AB (ref 8.9–10.3)
CO2: 24 mmol/L (ref 22–32)
Chloride: 107 mmol/L (ref 101–111)
Creatinine, Ser: 0.52 mg/dL (ref 0.44–1.00)
Glucose, Bld: 99 mg/dL (ref 65–99)
Potassium: 3.6 mmol/L (ref 3.5–5.1)
Sodium: 136 mmol/L (ref 135–145)

## 2016-05-18 LAB — LIPID PANEL
Cholesterol: 155 mg/dL (ref 0–200)
HDL: 49 mg/dL (ref >40)
LDL Cholesterol: 94 mg/dL (ref 0–99)
Total CHOL/HDL Ratio: 3.2 RATIO
Triglycerides: 60 mg/dL (ref <150)
VLDL: 12 mg/dL (ref 0–40)

## 2016-05-18 LAB — CBC
HCT: 30.8 % — ABNORMAL LOW (ref 36.0–46.0)
Hemoglobin: 9.6 g/dL — ABNORMAL LOW (ref 12.0–15.0)
MCH: 28.1 pg (ref 26.0–34.0)
MCHC: 31.2 g/dL (ref 30.0–36.0)
MCV: 90.1 fL (ref 78.0–100.0)
PLATELETS: 271 10*3/uL (ref 150–400)
RBC: 3.42 MIL/uL — AB (ref 3.87–5.11)
RDW: 13.7 % (ref 11.5–15.5)
WBC: 8.7 10*3/uL (ref 4.0–10.5)

## 2016-05-18 LAB — LACTIC ACID, PLASMA: LACTIC ACID, VENOUS: 1 mmol/L (ref 0.5–1.9)

## 2016-05-18 LAB — IRON AND TIBC
Iron: 54 ug/dL (ref 28–170)
SATURATION RATIOS: 19 % (ref 10.4–31.8)
TIBC: 288 ug/dL (ref 250–450)
UIBC: 234 ug/dL

## 2016-05-18 LAB — FOLATE: Folate: 31.8 ng/mL (ref >5.9)

## 2016-05-18 LAB — VITAMIN B12: VITAMIN B 12: 108 pg/mL — AB (ref 180–914)

## 2016-05-18 LAB — TSH: TSH: 0.951 u[IU]/mL (ref 0.350–4.500)

## 2016-05-18 LAB — RETICULOCYTES
RBC.: 3.44 MIL/uL — AB (ref 3.87–5.11)
RETIC CT PCT: 1 % (ref 0.4–3.1)
Retic Count, Absolute: 34.4 10*3/uL (ref 19.0–186.0)

## 2016-05-18 LAB — TROPONIN I: Troponin I: <0.03 ng/mL (ref <0.03)

## 2016-05-18 LAB — FERRITIN: Ferritin: 32 ng/mL (ref 11–307)

## 2016-05-18 MED ORDER — ATORVASTATIN CALCIUM 10 MG PO TABS
20.0000 mg | ORAL_TABLET | Freq: Every day | ORAL | Status: DC
  Administered 2016-05-18: 20 mg via ORAL
  Filled 2016-05-18: qty 2

## 2016-05-18 MED ORDER — CYANOCOBALAMIN 1000 MCG/ML IJ SOLN
1000.0000 ug | Freq: Every day | INTRAMUSCULAR | Status: DC
  Administered 2016-05-18: 1000 ug via INTRAMUSCULAR
  Filled 2016-05-18 (×2): qty 1

## 2016-05-18 MED ORDER — ASPIRIN 325 MG PO TABS
325.0000 mg | ORAL_TABLET | Freq: Every day | ORAL | Status: DC
  Administered 2016-05-18 – 2016-05-19 (×2): 325 mg via ORAL
  Filled 2016-05-18 (×2): qty 1

## 2016-05-18 MED ORDER — WHITE PETROLATUM GEL
Status: AC
  Administered 2016-05-18: 0.2
  Filled 2016-05-18: qty 1

## 2016-05-18 MED ORDER — CHLORHEXIDINE GLUCONATE 0.12 % MT SOLN
15.0000 mL | Freq: Two times a day (BID) | OROMUCOSAL | Status: DC
  Administered 2016-05-18 – 2016-05-19 (×2): 15 mL via OROMUCOSAL
  Filled 2016-05-18: qty 15

## 2016-05-18 MED ORDER — CETYLPYRIDINIUM CHLORIDE 0.05 % MT LIQD
7.0000 mL | Freq: Two times a day (BID) | OROMUCOSAL | Status: DC
  Administered 2016-05-19 (×2): 7 mL via OROMUCOSAL

## 2016-05-18 NOTE — Progress Notes (Addendum)
PROGRESS NOTE    [name]  [id number] DOB: [birth year] DOA: 05/17/2016 PCP: [name], MD   Brief Narrative:  Patient is a pleasant 82 year old female history of stroke, alcohol abuse, severe dementia, hypothyroidism, depression, urinary retention status post chronic Foley catheter, history of recurrent UTIs present to the ED with sudden onset right facial droop and right-sided weakness noted at nursing home with concerns for stroke. Patient was admitted initial code stroke called however subsequently canceled. Patient also noted to have concerns for a UTI from urinalysis. Urine cultures pending. Patient on empiric IV Rocephin. Patient admitted for stroke workup with MRI positive for acute CVA.    Assessment & Plan:   Principal Problem:   CVA (cerebral vascular accident) ([place]) Active Problems:   Encephalopathy acute   Hypothyroid   Depression   Hyperlipemia   Cognitive impairment   Constipation   Vascular dementia   Urinary retention   Severe dementia   Encephalopathy   Acute UTI   Leukocytosis   Dehydration   H/O: CVA (cerebrovascular accident)   UTI (urinary tract infection) due to urinary indwelling Foley catheter ([facility])   Acute encephalopathy  #1 acute 11 mm ischemic nonhemorrhagic lacunar type infarct within the left lentiform nucleus/acute CVA The MRI head. On presentation to the ED was noted at the nursing facility that patient had become more lethargic with some increased right-sided weakness as well as a right facial droop which was not appreciated when patient had presented to the ED. CT head was unremarkable. MRI head consistent with an acute infarct. MRA head with focal short segment of moderate mid left M1 segment stenosis, moderate to severe nonocclusive stenosis with attenuation involving the proximal M2 segments bilaterally right worse than left. Patent flow within the MCA branches distally. Additional multifocal atheromatous disease involving the  posterior circulation without high-grade flow-limiting stenosis. Patient currently nothing by mouth undergoing speech therapy evaluation. 2-D echo pending. Carotid Dopplers pending. Hemoglobin A1c pending. Fasting lipid panel pending. PT/OT. Continue Plavix for secondary stroke prevention. Patient was on Plavix prior to admission. Risk factor modification. Neurology following.  #2 acute encephalopathy Likely secondary to problem #1 and probable UTI. Urine cultures pending. Patient currently afebrile. Leukocytosis trending down. Continue empiric IV Rocephin. See problem #1.  #3 urinary tract infection Urine cultures pending. IV Rocephin.  #4 dehydration IV fluids.  #5 leukocytosis Likely secondary to UTI versus reactive. WBC is trending down. Continue empiric IV antibiotics.  #6 hypothyroidism TSH within normal limits. Continue Synthroid.  #7 vascular dementia/?? Parkinson's dementia Patient with masklike face. Continue home regimen of aricept. Outpatient f/u.  #8 depression/anxiety Continue Zoloft.  #9 chronic urinary retention Patient with chronic Foley catheter. Per family Foley catheter had been change in a while. Foley catheter changed yesterday. Continue to follow output.  #10 anemia Likely anemia of chronic disease. Also partially dilutional. Check an anemia panel. Follow H&H.   DVT prophylaxis: Lovenox Code Status: Full Family Communication: Updated patient. No family at bedside. Disposition Plan: Pending stroke evaluation and PT evaluation. Likely return back to nursing home.   Consultants:   Neurology: Dr.Shikhman  05/17/2016  Procedures:   CT head 05/17/2016  MRI MRA head 05/17/2016  Chest x-ray 05/17/2016  Antimicrobials:  IV Rocephin 05/17/2016   Subjective: Patient denies chest pain. No shortness of breath.  Objective: Filed Vitals:   05/18/16 0201 05/18/16 0402 05/18/16 0601 05/18/16 0800  BP: 104/53 114/56 111/65 111/51  Pulse: 61 62 56 57    Temp:  TempSrc:      Resp: 18 18 16 14   SpO2: 96% 97% 97% 97%    Intake/Output Summary (Last 24 hours) at 05/18/16 1045 Last data filed at 05/18/16 0900  Gross per 24 hour  Intake   1050 ml  Output      0 ml  Net   1050 ml   There were no vitals filed for this visit.  Examination:  General exam: Appears calm and comfortable. Mask like face Respiratory system: Clear to auscultation. Respiratory effort normal. Cardiovascular system: S1 & S2 heard, RRR. No JVD, murmurs, rubs, gallops or clicks. No pedal edema. Gastrointestinal system: Abdomen is nondistended, soft and nontender. No organomegaly or masses felt. Normal bowel sounds heard. Central nervous system: Alert and oriented. No focal neurological deficits. Extremities: 4/5 BUE strength. 3/5 BLE strength R < L Skin: No rashes, lesions or ulcers Psychiatry: Judgement and insight appear normal. Mood & affect appropriate.     Data Reviewed: I have personally reviewed following labs and imaging studies  CBC:  Recent Labs Lab 05/17/16 1455 05/17/16 1500 05/18/16 0521  WBC 20.7*  --  8.7  NEUTROABS 18.1*  --   --   HGB 11.6* 12.9 9.6*  HCT 36.6 38.0 30.8*  MCV 88.4  --  90.1  PLT 362  --  99991111   Basic Metabolic Panel:  Recent Labs Lab 05/17/16 1455 05/17/16 1500 05/18/16 0521  NA 135 136 136  K 4.5 4.6 3.6  CL 101 98* 107  CO2 25  --  24  GLUCOSE 132* 129* 99  BUN 9 13 7   CREATININE 0.82 0.80 0.52  CALCIUM 8.8*  --  8.0*   GFR: CrCl cannot be calculated (Unknown ideal weight.). Liver Function Tests:  Recent Labs Lab 05/17/16 1455  AST 25  ALT 15  ALKPHOS 80  BILITOT 0.2*  PROT 6.6  ALBUMIN 3.3*   No results for input(s): LIPASE, AMYLASE in the last 168 hours. No results for input(s): AMMONIA in the last 168 hours. Coagulation Profile:  Recent Labs Lab 05/17/16 1455 05/17/16 2309  INR 1.11 1.31   Cardiac Enzymes:  Recent Labs Lab 05/17/16 2309  TROPONINI <0.03   BNP (last 3  results) No results for input(s): PROBNP in the last 8760 hours. HbA1C: No results for input(s): HGBA1C in the last 72 hours. CBG:  Recent Labs Lab 05/17/16 1525  GLUCAP 122*   Lipid Profile:  Recent Labs  05/18/16 0521  CHOL 155  HDL 49  LDLCALC 94  TRIG 60  CHOLHDL 3.2   Thyroid Function Tests:  Recent Labs  05/17/16 2309  TSH 0.951   Anemia Panel:  Recent Labs  05/18/16 0803  RETICCTPCT 1.0   Sepsis Labs:  Recent Labs Lab 05/17/16 1720 05/18/16 0521  LATICACIDVEN 2.16* 1.0    No results found for this or any previous visit (from the past 240 hour(s)).       Radiology Studies: Dg Chest 2 View  05/17/2016  CLINICAL DATA:  Altered mental status EXAM: CHEST  2 VIEW COMPARISON:  September 23, 2014 FINDINGS: There is mild left base atelectasis. There is no edema or consolidation. Heart size and pulmonary vascularity are normal. No adenopathy. There is atherosclerotic calcification in the aortic arch region. There are multiple old healed rib fractures on the right. There is superior migration of both humeral heads with degenerative change in both shoulders. IMPRESSION: Mild left base atelectasis. No edema or consolidation. Aortic atherosclerosis noted. Chronic rotator cuff tears in both  shoulders, characterized by superior migration of each humeral head. Multiple old healed rib fractures on the right. Electronically Signed   By: Lowella Grip III M.D.   On: 05/17/2016 15:44   Mr Brain Wo Contrast  05/17/2016  CLINICAL DATA:  Initial evaluation for acute right-sided weakness. EXAM: MRI HEAD WITHOUT CONTRAST MRA HEAD WITHOUT CONTRAST TECHNIQUE: Multiplanar, multiecho pulse sequences of the brain and surrounding structures were obtained without intravenous contrast. Angiographic images of the head were obtained using MRA technique without contrast. COMPARISON:  Prior CT from earlier the same day. FINDINGS: MRI HEAD FINDINGS Diffuse prominence of the CSF containing  spaces is compatible with generalized age-related cerebral atrophy. Scattered patchy confluent T2/FLAIR hyperintensity within the periventricular and deep white matter both cerebral hemispheres most consistent with chronic small vessel ischemic disease, moderate in nature. Remote lacunar infarct within the left caudate head. Scattered foci susceptibility artifact present within the anterior left frontal lobe, consistent with chronic hemorrhagic blood products, which may related to remote ischemia or possibly trauma. These are stable relative to prior MRI from 2012. No other remote infarct. 11 mm focus of linear restricted diffusion within the left lentiform nucleus, consistent with an acute ischemic infarct (series 3, image 20). No associated mass effect or hemorrhage. No other acute infarction. Major intracranial vascular flow voids are maintained. Gray-white matter differentiation otherwise preserved. No mass lesion, midline shift, or mass effect. Mild ventricular prominence related to global parenchymal volume loss without hydrocephalus. No extra-axial fluid collection. Major dural sinuses are grossly patent. Craniocervical junction within normal limits. Degenerative spondylolysis noted at the C3-4 level with mild canal stenosis. Pituitary gland within normal limits. No acute abnormality about the globes and orbits. Patient is status post lens extraction bilaterally. Scattered mucosal thickening within the ethmoidal air cells and maxillary sinuses. No air-fluid level to suggest active sinus infection. Trace opacity left mastoid air cells. Inner ear structures grossly normal. Bone marrow signal intensity within normal limits. No scalp soft tissue abnormality. MRA HEAD FINDINGS ANTERIOR CIRCULATION: Study fairly degraded by motion artifact. Visualized distal cervical segments of the internal carotid arteries are patent with antegrade flow. Focal apparent stenoses at the bilateral petrous-cervical junction of the  internal reason is favored to be artifactual in nature given the symmetric nature of these findings. Additionally, there is motion artifact through this portion of the exam. Petrous segments otherwise widely patent. Scattered atheromatous irregularity within the cavernous/supraclinoid ICAs bilaterally without flow-limiting stenosis. A1 segments demonstrate multi focal irregularity but are widely patent without significant stenosis. Anterior communicating artery within normal limits. Anterior cerebral arteries well opacified. Focal short-segment moderate stenosis within the mid left M1 segment (series 453, image 8). M1 segment patent distally. Left MCA bifurcation normal. Proximal left M2 stenosis and attenuation. Left MCA branches are well opacified distally but demonstrate multifocal atheromatous irregularity. Right M1 segment patent to its distal aspect without stenosis or occlusion. There is fairly severe stenosis at the right MCA bifurcation extending into the proximal right M2 branches. This appears to be nonocclusive with flow related opacification within the right MCA branches distally. Right MCA branches also demonstrate multifocal atheromatous irregularity. POSTERIOR CIRCULATION: Vertebral arteries largely code dominant and patent to the vertebrobasilar junction. Posterior inferior cerebral arteries patent proximally. Basilar artery patent without stenosis or occlusion. Basilar tip is ectatic without focal aneurysm. Superior cerebellar arteries patent bilaterally, although there is a probable moderate to severe stenosis at the origin of the left SCA. Both of the posterior cerebral arteries arise knee basilar artery. Focal mild  stenoses at the proximal P1 segments. PCAs demonstrate atheromatous irregularity distally, left greater than right, but are opacified to their distal aspects. No aneurysm or vascular malformation. IMPRESSION: MRI HEAD IMPRESSION: 1. 11 mm acute ischemic nonhemorrhagic lacunar type  infarction within the left lentiform nucleus. No associated mass effect. 2. Remote lacunar infarct within the left caudate head. 3. Generalized age-related cerebral atrophy with moderate chronic small vessel ischemic disease. MRA HEAD IMPRESSION: 1. No proximal or large vessel occlusion within the intracranial circulation. 2. Focal short segment moderate mid left M1 segment stenosis. 3. Moderate to severe nonocclusive stenoses with attenuation involving the proximal M2 segments bilaterally, right worse than left. Patent flow within the MCA branches distally. 4. Additional multifocal atheromatous disease involving the posterior circulation as above without high-grade flow-limiting stenosis. 5. Distal small vessel regularity involving the MCA and PCA branches bilaterally. Electronically Signed   By: Jeannine Boga M.D.   On: 05/17/2016 22:56   Mr Jodene Nam Head/brain Wo Cm  05/17/2016  CLINICAL DATA:  Initial evaluation for acute right-sided weakness. EXAM: MRI HEAD WITHOUT CONTRAST MRA HEAD WITHOUT CONTRAST TECHNIQUE: Multiplanar, multiecho pulse sequences of the brain and surrounding structures were obtained without intravenous contrast. Angiographic images of the head were obtained using MRA technique without contrast. COMPARISON:  Prior CT from earlier the same day. FINDINGS: MRI HEAD FINDINGS Diffuse prominence of the CSF containing spaces is compatible with generalized age-related cerebral atrophy. Scattered patchy confluent T2/FLAIR hyperintensity within the periventricular and deep white matter both cerebral hemispheres most consistent with chronic small vessel ischemic disease, moderate in nature. Remote lacunar infarct within the left caudate head. Scattered foci susceptibility artifact present within the anterior left frontal lobe, consistent with chronic hemorrhagic blood products, which may related to remote ischemia or possibly trauma. These are stable relative to prior MRI from 2012. No other remote  infarct. 11 mm focus of linear restricted diffusion within the left lentiform nucleus, consistent with an acute ischemic infarct (series 3, image 20). No associated mass effect or hemorrhage. No other acute infarction. Major intracranial vascular flow voids are maintained. Gray-white matter differentiation otherwise preserved. No mass lesion, midline shift, or mass effect. Mild ventricular prominence related to global parenchymal volume loss without hydrocephalus. No extra-axial fluid collection. Major dural sinuses are grossly patent. Craniocervical junction within normal limits. Degenerative spondylolysis noted at the C3-4 level with mild canal stenosis. Pituitary gland within normal limits. No acute abnormality about the globes and orbits. Patient is status post lens extraction bilaterally. Scattered mucosal thickening within the ethmoidal air cells and maxillary sinuses. No air-fluid level to suggest active sinus infection. Trace opacity left mastoid air cells. Inner ear structures grossly normal. Bone marrow signal intensity within normal limits. No scalp soft tissue abnormality. MRA HEAD FINDINGS ANTERIOR CIRCULATION: Study fairly degraded by motion artifact. Visualized distal cervical segments of the internal carotid arteries are patent with antegrade flow. Focal apparent stenoses at the bilateral petrous-cervical junction of the internal reason is favored to be artifactual in nature given the symmetric nature of these findings. Additionally, there is motion artifact through this portion of the exam. Petrous segments otherwise widely patent. Scattered atheromatous irregularity within the cavernous/supraclinoid ICAs bilaterally without flow-limiting stenosis. A1 segments demonstrate multi focal irregularity but are widely patent without significant stenosis. Anterior communicating artery within normal limits. Anterior cerebral arteries well opacified. Focal short-segment moderate stenosis within the mid left  M1 segment (series 453, image 8). M1 segment patent distally. Left MCA bifurcation normal. Proximal left M2 stenosis and attenuation.  Left MCA branches are well opacified distally but demonstrate multifocal atheromatous irregularity. Right M1 segment patent to its distal aspect without stenosis or occlusion. There is fairly severe stenosis at the right MCA bifurcation extending into the proximal right M2 branches. This appears to be nonocclusive with flow related opacification within the right MCA branches distally. Right MCA branches also demonstrate multifocal atheromatous irregularity. POSTERIOR CIRCULATION: Vertebral arteries largely code dominant and patent to the vertebrobasilar junction. Posterior inferior cerebral arteries patent proximally. Basilar artery patent without stenosis or occlusion. Basilar tip is ectatic without focal aneurysm. Superior cerebellar arteries patent bilaterally, although there is a probable moderate to severe stenosis at the origin of the left SCA. Both of the posterior cerebral arteries arise knee basilar artery. Focal mild stenoses at the proximal P1 segments. PCAs demonstrate atheromatous irregularity distally, left greater than right, but are opacified to their distal aspects. No aneurysm or vascular malformation. IMPRESSION: MRI HEAD IMPRESSION: 1. 11 mm acute ischemic nonhemorrhagic lacunar type infarction within the left lentiform nucleus. No associated mass effect. 2. Remote lacunar infarct within the left caudate head. 3. Generalized age-related cerebral atrophy with moderate chronic small vessel ischemic disease. MRA HEAD IMPRESSION: 1. No proximal or large vessel occlusion within the intracranial circulation. 2. Focal short segment moderate mid left M1 segment stenosis. 3. Moderate to severe nonocclusive stenoses with attenuation involving the proximal M2 segments bilaterally, right worse than left. Patent flow within the MCA branches distally. 4. Additional multifocal  atheromatous disease involving the posterior circulation as above without high-grade flow-limiting stenosis. 5. Distal small vessel regularity involving the MCA and PCA branches bilaterally. Electronically Signed   By: Jeannine Boga M.D.   On: 05/17/2016 22:56   Ct Head Code Stroke W/o Cm  05/17/2016  CLINICAL DATA:  Code stroke for slurred speech and facial droop. EXAM: CT HEAD WITHOUT CONTRAST TECHNIQUE: Contiguous axial images were obtained from the base of the skull through the vertex without intravenous contrast. COMPARISON:  01/29/2015 FINDINGS: Brain: No evidence of acute infarction, hemorrhage, hydrocephalus, or mass lesion/mass effect. Stable patchy microvascular ischemic gliosis in the bilateral cerebral white matter. Stable patchy low-density in the bilateral thalamus and left caudate. Accounting for chronic changes ASPCETS is 10. Mild to moderate for age generalized atrophy. Vascular: No hyperdense vessel. Skull: Negative for fracture or focal lesion. Sinuses/Orbits: Bilateral cataract resection.  No acute finding. Other: These results were called by telephone at the time of interpretation on 05/17/2016 at 3:04 pm to Dr. Cristobal Goldmann, who verbally acknowledged these results. IMPRESSION: 1. No acute finding or change from 2016. 2. Moderate chronic microvascular disease. Electronically Signed   By: Monte Fantasia M.D.   On: 05/17/2016 15:05        Scheduled Meds: .  stroke: mapping our early stages of recovery book   Does not apply Once  . acidophilus  1 capsule Oral Daily  . aspirin  325 mg Oral Daily  . atorvastatin  20 mg Oral q1800  . cefTRIAXone (ROCEPHIN)  IV  1 g Intravenous Q24H  . donepezil  10 mg Oral QHS  . enoxaparin (LOVENOX) injection  30 mg Subcutaneous Q24H  . levothyroxine  150 mcg Oral QAC breakfast  . multivitamin with minerals  1 tablet Oral Daily  . nystatin cream  1 application Topical BID  . pantoprazole  40 mg Oral Q0600  . sertraline  25 mg Oral Daily  .  white petrolatum       Continuous Infusions: . sodium chloride 75 mL/hr at  05/18/16 0510     LOS: 1 day    Time spent: 61 mins    THOMPSON,DANIEL, MD [facility] Pager [phone]  If 7PM-7AM, please contact night-coverage [email or web address] Password [email or web address] 05/18/2016, 10:45 AM

## 2016-05-18 NOTE — Evaluation (Signed)
Occupational Therapy Evaluation Patient Details Name: [name] MRN: [id number] DOB: 1931-06-14 Today's Date: 05/18/2016    History of Present Illness 82 yo female from [facility] at w/c level with R side weakness. PMH: ETOH abuse, arthritis, depression, L hip surg   Clinical Impression   OT to defer all further treatment to SNF level care. Pt from SNF memory unit and wheelchair bound with (A) for adls prior to admission. OT to sign off acutely. Pt will need w/c assessment upon d/c to SNF.     Follow Up Recommendations  SNF    Equipment Recommendations  Wheelchair cushion (measurements OT);Wheelchair (measurements OT);Other (comment);Hospital bed (hoyer)    Recommendations for Other Services Speech consult     Precautions / Restrictions Precautions Precautions: Fall      Mobility Bed Mobility Overal bed mobility: +2 for physical assistance;Needs Assistance Bed Mobility: Sit to Supine       Sit to supine: Total assist;+2 for physical assistance   General bed mobility comments: pt unable to complete segmental roll . pt moves as one unit with therapist  Transfers Overall transfer level: Needs assistance   Transfers: Sit to/from Stand Sit to Stand: +2 physical assistance;Max assist         General transfer comment: pt needed (A) to place hand on stedy, encouragement to participate, max (A) to anterior weight shift. pt resisting at times. Pt required total +2 max with pad at hips to maintain hip extension    Balance Overall balance assessment: Needs assistance Sitting-balance support: No upper extremity supported;Feet supported Sitting balance-Leahy Scale: Poor                                      ADL Overall ADL's : Needs assistance/impaired Eating/Feeding: Minimal assistance Eating/Feeding Details (indicate cue type and reason): SLp recommendations Grooming: Moderate assistance;Sitting Grooming Details (indicate cue type and  reason): pt with tremor to apply chapstick                 Toilet Transfer: +2 for physical assistance;Maximal assistance Toilet Transfer Details (indicate cue type and reason): (A) with pad to hip extend to stand. pt fearful of falling and resisting posteriorly         Functional mobility during ADLs:  (n/a) General ADL Comments: Pt currently posterior lean in chair and in standing. pt with resistance to static standing. pt unsafe to transfer without DME. pt placed in stedy for safety     Vision     Perception     Praxis      Pertinent Vitals/Pain Pain Assessment: No/denies pain     Hand Dominance     Extremity/Trunk Assessment Upper Extremity Assessment Upper Extremity Assessment: LUE deficits/detail;RUE deficits/detail RUE Deficits / Details: shoulder flexion ~70 degrees LUE Deficits / Details: shoulder flexion ~80 degrees   Lower Extremity Assessment Lower Extremity Assessment: Defer to PT evaluation (edema in bil Le)   Cervical / Trunk Assessment Cervical / Trunk Assessment: Kyphotic   Communication Communication Communication: No difficulties   Cognition Arousal/Alertness: Awake/alert Behavior During Therapy: Flat affect Overall Cognitive Status: History of cognitive impairments - at baseline                     General Comments       Exercises       Shoulder Instructions      Home Living Family/patient expects to be  discharged to:: Skilled nursing facility                                        Prior Functioning/Environment Level of Independence: Needs assistance  Gait / Transfers Assistance Needed: w/c level ADL's / Homemaking Assistance Needed: assist with all adls        OT Diagnosis:     OT Problem List:     OT Treatment/Interventions:      OT Goals(Current goals can be found in the care plan section) Acute Rehab OT Goals Potential to Achieve Goals: Poor  OT Frequency:     Barriers to D/C:             Co-evaluation              End of Session Equipment Utilized During Treatment: Gait belt Nurse Communication: Mobility status;Need for lift equipment;Precautions  Activity Tolerance: Patient tolerated treatment well Patient left: in bed;with call bell/phone within reach;with bed alarm set (rails up)   Time: [id number] OT Time Calculation (min): 18 min Charges:  OT General Charges $OT Visit: 1 Procedure OT Evaluation $OT Eval Moderate Complexity: 1 Procedure G-Codes:    Peri Maris May 19, 2016, 2:23 PM   Jeri Modena   OTR/L Pager: [phone] Office: [phone] .

## 2016-05-18 NOTE — Progress Notes (Signed)
   05/18/16 1208  Pressure Ulcer Prevention  Repositioned Sitting  Positioning Frequency Every 2 hours  Hygiene  Hygiene Peri care  Level of Assistance Dependent  Mobility  Activity Chair  Level of Assistance Total care  Assistive Device Stedy  Bed Position Chair  Range of Motion Active;All extremities

## 2016-05-18 NOTE — Evaluation (Signed)
Physical Therapy Evaluation Patient Details Name: [name] MRN: [id number] DOB: 1931/11/18 Today's Date: 05/18/2016   History of Present Illness  82 yo female from [facility] at w/c level with R side weakness. PMH: ETOH abuse, arthritis, depression, L hip surg  Clinical Impression  Patient presents with weakness, stiffness, edema, and baseline cognitive deficits impacting mobility. Pt from SNF memory care unit, dependent for all care and w/c bound at baseline. Tolerated transfer with total A of 2 using the stedy. Will defer all further treatment to SNF level care if appropriate. Discharge from therapy.    Follow Up Recommendations SNF    Equipment Recommendations  Wheelchair (measurements PT);Wheelchair cushion (measurements PT)    Recommendations for Other Services       Precautions / Restrictions Precautions Precautions: Fall Restrictions Weight Bearing Restrictions: No      Mobility  Bed Mobility Overal bed mobility: +2 for physical assistance;Needs Assistance Bed Mobility: Sit to Supine       Sit to supine: Total assist;+2 for physical assistance   General bed mobility comments: pt unable to complete segmental roll . pt moves as one unit with therapist  Transfers Overall transfer level: Needs assistance   Transfers: Sit to/from Stand Sit to Stand: +2 physical assistance;Max assist         General transfer comment: pt needed (A) to place hand on stedy, encouragement to participate, max (A) to anterior weight shift. pt resisting at times. Pt required total +2 max with pad at hips to maintain hip extension  Ambulation/Gait                Stairs            Wheelchair Mobility    Modified Rankin (Stroke Patients Only) Modified Rankin (Stroke Patients Only) Pre-Morbid Rankin Score: Severe disability Modified Rankin: Severe disability     Balance Overall balance assessment: Needs assistance Sitting-balance support: Feet  supported;No upper extremity supported Sitting balance-Leahy Scale: Poor Sitting balance - Comments: Fearful of falling so resisting at times.                                      Pertinent Vitals/Pain Pain Assessment: No/denies pain    Home Living Family/patient expects to be discharged to:: Skilled nursing facility                      Prior Function Level of Independence: Needs assistance   Gait / Transfers Assistance Needed: w/c level  ADL's / Homemaking Assistance Needed: assist with all adls        Hand Dominance        Extremity/Trunk Assessment   Upper Extremity Assessment: Defer to OT evaluation RUE Deficits / Details: shoulder flexion ~70 degrees     LUE Deficits / Details: shoulder flexion ~80 degrees   Lower Extremity Assessment: Generalized weakness;RLE deficits/detail;LLE deficits/detail;Difficult to assess due to impaired cognition RLE Deficits / Details: Stiffness in BLEs LLE Deficits / Details: Stiffness in BLEs  Cervical / Trunk Assessment: Kyphotic  Communication   Communication: No difficulties  Cognition Arousal/Alertness: Awake/alert Behavior During Therapy: Flat affect Overall Cognitive Status: History of cognitive impairments - at baseline                      General Comments General comments (skin integrity, edema, etc.): risk of skin break down due to prlonged positioning.  Exercises        Assessment/Plan    PT Assessment All further PT needs can be met in the next venue of care  PT Diagnosis Generalized weakness;Altered mental status   PT Problem List Decreased strength;Decreased range of motion;Decreased cognition;Decreased balance;Decreased mobility;Decreased activity tolerance;Impaired tone;Decreased knowledge of precautions;Decreased coordination  PT Treatment Interventions Functional mobility training;Therapeutic activities;Therapeutic exercise;Wheelchair mobility training;Patient/family  education;Neuromuscular re-education;Balance training;DME instruction   PT Goals (Current goals can be found in the Care Plan section) Acute Rehab PT Goals Patient Stated Goal: to go back to bed PT Goal Formulation: All assessment and education complete, DC therapy Time For Goal Achievement: 06/01/16 Potential to Achieve Goals: Fair    Frequency Min 2X/week   Barriers to discharge        Co-evaluation PT/OT/SLP Co-Evaluation/Treatment: Yes Reason for Co-Treatment: For patient/therapist safety;Complexity of the patient's impairments (multi-system involvement) PT goals addressed during session: Mobility/safety with mobility         End of Session Equipment Utilized During Treatment: Gait belt Activity Tolerance: Patient tolerated treatment well Patient left: in bed;with call bell/phone within reach;with bed alarm set Nurse Communication: Mobility status;Need for lift equipment         Time: 1345-1403 PT Time Calculation (min) (ACUTE ONLY): 18 min   Charges:   PT Evaluation $PT Eval Moderate Complexity: 1 Procedure     PT G Codes:        [name] 05/18/2016, 2:46 PM [name], [place], DPT [phone]

## 2016-05-18 NOTE — Evaluation (Signed)
Clinical/Bedside Swallow Evaluation Patient Details  Name: [name] MRN: [id number] Date of Birth: 01/13/1931  Today's Date: 05/18/2016 Time: SLP Start Time (ACUTE ONLY): 1014 SLP Stop Time (ACUTE ONLY): 1114 SLP Time Calculation (min) (ACUTE ONLY): 60 min  Past Medical History:  Past Medical History  Diagnosis Date  . Alcohol abuse   . Arthritis   . Depression   . Stroke ([employer])   . Thyroid disease     hypothyroid   Past Surgical History:  Past Surgical History  Procedure Laterality Date  . Cholecystectomy  2008  . Tonsillectomy and adenoidectomy    . Abdominal hysterectomy      partial  . Thyroid surgery    . Total hip arthroplasty Left 09/24/2014    Procedure: TOTAL HIP ARTHROPLASTY ANTERIOR APPROACH, LEFT;  Surgeon: Mcarthur Rossetti, MD;  Location: [facility];  Service: Orthopedics;  Laterality: Left;   HPI:  [name] is a 82 y.o. female with medical history significant of prior history of stroke, history of alcohol abuse, severe dementia, hypothyroidism, depression,, urinary retention status post chronic Foley catheter, history of recurrent UTIs presented to the ED with sudden onset right facial droop with right-sided weakness noted a nursing home with concern for stroke. Patient with some dysarthric speech and unintelligible speech and a such history was obtained from the chart And per daughter's. It was noted that patient was last seen normal around 8 AM on the morning of admission which was noted to fit SL for breakfast. It was noted that patient became very weak and lethargic and fatigued to the point where she could not pick up any food around lunchtime. Nurse at the memory care unit assessed the patient and felt patient may have had a right facial droop,slurred speech and right-sided weakness and a such EMS was called and patient was brought to the ED as a code stroke. The patient and per family denying any recent fevers, no chills, no nausea, no vomiting, no chest  pain, no shortness of breath, no abdominal pain, no hematemesis, no hematochezia, no melena, no cough. Patient does endorse some dysuria. The family patient with a weak cough. Patient with no other associated symptoms   Assessment / Plan / Recommendation Clinical Impression   Pt presents with functional oropharyngeal swallow without symptoms of airway compromise with all po intake observed *applesauce, cracker, thin.  She took small bites/sips only.  She had a masked face, no focal CN deficits and was disoreinted to place, birthdate,  situation - h/o dementia.  Pt able to self feed with set up.  Her voice is weak but clear.  Pt's family arrived during evaluation and one daughter admitted to concerns with pt's weak cough.  Advised that with pt's Parkinsonism may be source of weak cough and educated pt/family to need for pt to strengthen diaphgram for swallow/respiratory strength.  Pt's family also admits that pt has worsening speech "thick tongue" with decreased vocal strength compared to prior to this acute stroke.  Recommend SLP speech evaluation and treatment.  Educated pt/family to aspiration precautions and changes with swallowing related to Parkinsonism and dementia.      Aspiration Risk  Mild aspiration risk    Diet Recommendation   regular/thin   Medication Administration: Whole meds with liquid (if problematic with puree)    Other  Recommendations Oral Care Recommendations: Oral care BID   Follow up Recommendations  Skilled Nursing facility    Frequency and Duration     n/a  Prognosis Prognosis for Safe Diet Advancement: Good      Swallow Study   General Date of Onset: 05/18/16 HPI: [name] is a 82 y.o. female with medical history significant of prior history of stroke, history of alcohol abuse, severe dementia, hypothyroidism, depression,, urinary retention status post chronic Foley catheter, history of recurrent UTIs presented to the ED with sudden onset right facial  droop with right-sided weakness noted a nursing home with concern for stroke. Patient with some dysarthric speech and unintelligible speech and a such history was obtained from the chart And per daughter's. It was noted that patient was last seen normal around 8 AM on the morning of admission which was noted to fit SL for breakfast. It was noted that patient became very weak and lethargic and fatigued to the point where she could not pick up any food around lunchtime. Nurse at the memory care unit assessed the patient and felt patient may have had a right facial droop,slurred speech and right-sided weakness and a such EMS was called and patient was brought to the ED as a code stroke. The patient and per family denying any recent fevers, no chills, no nausea, no vomiting, no chest pain, no shortness of breath, no abdominal pain, no hematemesis, no hematochezia, no melena, no cough. Patient does endorse some dysuria. The family patient with a weak cough. Patient with no other associated symptoms Type of Study: Bedside Swallow Evaluation Previous Swallow Assessment: none Diet Prior to this Study: NPO Temperature Spikes Noted: No Respiratory Status: Room air History of Recent Intubation: No Behavior/Cognition: Alert;Cooperative;Pleasant mood Oral Cavity Assessment: Erythema;Lesions;Dry Oral Care Completed by SLP: No Oral Cavity - Dentition: Adequate natural dentition Vision: Functional for self-feeding Self-Feeding Abilities: Able to feed self Patient Positioning: Upright in bed Baseline Vocal Quality: Low vocal intensity Volitional Cough: Weak Volitional Swallow: Able to elicit    Oral/Motor/Sensory Function Overall Oral Motor/Sensory Function: Within functional limits (crusting on lips - pt reports present x several months)   Ice Chips Ice chips: Within functional limits Presentation: Spoon   Thin Liquid Thin Liquid: Within functional limits Presentation: Cup;Straw    Nectar Thick Nectar Thick  Liquid: Not tested   Honey Thick Honey Thick Liquid: Not tested   Puree Puree: Within functional limits   Solid   GO   Solid: Within functional limits        [name], [place] [facility] [place] SLP [phone]

## 2016-05-18 NOTE — Progress Notes (Signed)
STROKE TEAM PROGRESS NOTE   HISTORY OF PRESENT ILLNESS (per record) [name] is an 82 y.o. female Pt from [facility] reports pt fed herself breakfast at 0800 ([place] 05/17/2016) and at lunch pt had to be fed. EMS reports pt normally has right sided weakness from previous stroke. On arrival patient cannot give history. She keeps eyes closed and shows no weakness on either side. She has a very masked face with no facial droop. She is able to name objects. Patient was not administered IV t-PA secondary to  minimal symptoms and out of window. She was admitted for further evaluation and treatment.   SUBJECTIVE (INTERVAL HISTORY) Her ST is at the bedside. No family present.   Overall she feels her condition is stable. Dr. Leonie Man spoke at length with patient's daughters who arrived during rounds.reviewed prior neurological notes from dr Tat whom she sees for dementia with vascular parkinsonism. MMSE 7/30 in visit 02/18/15   OBJECTIVE Temp:  [98 F (36.7 C)-99.3 F (37.4 C)] 98 F (36.7 C) (06/29 2344) Pulse Rate:  [56-90] 57 (06/30 0800) Cardiac Rhythm:  [-] Normal sinus rhythm (06/30 0700) Resp:  [13-19] 14 (06/30 0800) BP: (102-134)/(50-74) 111/51 mmHg (06/30 0800) SpO2:  [94 %-100 %] 97 % (06/30 0800) FiO2 (%):  [21 %] 21 % (06/29 1946) Weight:  [51.256 kg (113 lb)] 51.256 kg (113 lb) (06/29 2134)  CBC:  Recent Labs Lab 05/17/16 1455 05/17/16 1500 05/18/16 0521  WBC 20.7*  --  8.7  NEUTROABS 18.1*  --   --   HGB 11.6* 12.9 9.6*  HCT 36.6 38.0 30.8*  MCV 88.4  --  90.1  PLT 362  --  99991111    Basic Metabolic Panel:  Recent Labs Lab 05/17/16 1455 05/17/16 1500 05/18/16 0521  NA 135 136 136  K 4.5 4.6 3.6  CL 101 98* 107  CO2 25  --  24  GLUCOSE 132* 129* 99  BUN 9 13 7   CREATININE 0.82 0.80 0.52  CALCIUM 8.8*  --  8.0*    Lipid Panel:    Component Value Date/Time   CHOL 155 05/18/2016 0521   TRIG 60 05/18/2016 0521   HDL 49 05/18/2016 0521    CHOLHDL 3.2 05/18/2016 0521   VLDL 12 05/18/2016 0521   LDLCALC 94 05/18/2016 0521   HgbA1c:  Lab Results  Component Value Date   HGBA1C * 02/15/2011    6.0 (NOTE)                                                                       According to the ADA Clinical Practice Recommendations for 2011, when HbA1c is used as a screening test:   >=6.5%   Diagnostic of Diabetes Mellitus           (if abnormal result  is confirmed)  5.7-6.4%   Increased risk of developing Diabetes Mellitus  References:Diagnosis and Classification of Diabetes Mellitus,Diabetes [id number] 1):S62-S69 and Standards of Medical Care in         Diabetes - 2011,Diabetes Care,2011,34  (Suppl 1):S11-S61.   Urine Drug Screen:    Component Value Date/Time   LABOPIA NONE DETECTED 05/17/2016 [place] DETECTED 05/17/2016 1519  LABBENZ NONE DETECTED 05/17/2016 1519   AMPHETMU NONE DETECTED 05/17/2016 1519   THCU NONE DETECTED 05/17/2016 1519   LABBARB NONE DETECTED 05/17/2016 1519      IMAGING  Dg Chest 2 View  05/17/2016  CLINICAL DATA:  Altered mental status EXAM: CHEST  2 VIEW COMPARISON:  September 23, 2014 FINDINGS: There is mild left base atelectasis. There is no edema or consolidation. Heart size and pulmonary vascularity are normal. No adenopathy. There is atherosclerotic calcification in the aortic arch region. There are multiple old healed rib fractures on the right. There is superior migration of both humeral heads with degenerative change in both shoulders. IMPRESSION: Mild left base atelectasis. No edema or consolidation. Aortic atherosclerosis noted. Chronic rotator cuff tears in both shoulders, characterized by superior migration of each humeral head. Multiple old healed rib fractures on the right. Electronically Signed   By: Lowella Grip III M.D.   On: 05/17/2016 15:44   Mr Brain Wo Contrast  05/17/2016  CLINICAL DATA:  Initial evaluation for acute right-sided weakness. EXAM: MRI HEAD  WITHOUT CONTRAST MRA HEAD WITHOUT CONTRAST TECHNIQUE: Multiplanar, multiecho pulse sequences of the brain and surrounding structures were obtained without intravenous contrast. Angiographic images of the head were obtained using MRA technique without contrast. COMPARISON:  Prior CT from earlier the same day. FINDINGS: MRI HEAD FINDINGS Diffuse prominence of the CSF containing spaces is compatible with generalized age-related cerebral atrophy. Scattered patchy confluent T2/FLAIR hyperintensity within the periventricular and deep white matter both cerebral hemispheres most consistent with chronic small vessel ischemic disease, moderate in nature. Remote lacunar infarct within the left caudate head. Scattered foci susceptibility artifact present within the anterior left frontal lobe, consistent with chronic hemorrhagic blood products, which may related to remote ischemia or possibly trauma. These are stable relative to prior MRI from 2012. No other remote infarct. 11 mm focus of linear restricted diffusion within the left lentiform nucleus, consistent with an acute ischemic infarct (series 3, image 20). No associated mass effect or hemorrhage. No other acute infarction. Major intracranial vascular flow voids are maintained. Gray-white matter differentiation otherwise preserved. No mass lesion, midline shift, or mass effect. Mild ventricular prominence related to global parenchymal volume loss without hydrocephalus. No extra-axial fluid collection. Major dural sinuses are grossly patent. Craniocervical junction within normal limits. Degenerative spondylolysis noted at the C3-4 level with mild canal stenosis. Pituitary gland within normal limits. No acute abnormality about the globes and orbits. Patient is status post lens extraction bilaterally. Scattered mucosal thickening within the ethmoidal air cells and maxillary sinuses. No air-fluid level to suggest active sinus infection. Trace opacity left mastoid air cells.  Inner ear structures grossly normal. Bone marrow signal intensity within normal limits. No scalp soft tissue abnormality. MRA HEAD FINDINGS ANTERIOR CIRCULATION: Study fairly degraded by motion artifact. Visualized distal cervical segments of the internal carotid arteries are patent with antegrade flow. Focal apparent stenoses at the bilateral petrous-cervical junction of the internal reason is favored to be artifactual in nature given the symmetric nature of these findings. Additionally, there is motion artifact through this portion of the exam. Petrous segments otherwise widely patent. Scattered atheromatous irregularity within the cavernous/supraclinoid ICAs bilaterally without flow-limiting stenosis. A1 segments demonstrate multi focal irregularity but are widely patent without significant stenosis. Anterior communicating artery within normal limits. Anterior cerebral arteries well opacified. Focal short-segment moderate stenosis within the mid left M1 segment (series 453, image 8). M1 segment patent distally. Left MCA bifurcation normal. Proximal left M2 stenosis and attenuation. Left  MCA branches are well opacified distally but demonstrate multifocal atheromatous irregularity. Right M1 segment patent to its distal aspect without stenosis or occlusion. There is fairly severe stenosis at the right MCA bifurcation extending into the proximal right M2 branches. This appears to be nonocclusive with flow related opacification within the right MCA branches distally. Right MCA branches also demonstrate multifocal atheromatous irregularity. POSTERIOR CIRCULATION: Vertebral arteries largely code dominant and patent to the vertebrobasilar junction. Posterior inferior cerebral arteries patent proximally. Basilar artery patent without stenosis or occlusion. Basilar tip is ectatic without focal aneurysm. Superior cerebellar arteries patent bilaterally, although there is a probable moderate to severe stenosis at the origin  of the left SCA. Both of the posterior cerebral arteries arise knee basilar artery. Focal mild stenoses at the proximal P1 segments. PCAs demonstrate atheromatous irregularity distally, left greater than right, but are opacified to their distal aspects. No aneurysm or vascular malformation. IMPRESSION: MRI HEAD IMPRESSION: 1. 11 mm acute ischemic nonhemorrhagic lacunar type infarction within the left lentiform nucleus. No associated mass effect. 2. Remote lacunar infarct within the left caudate head. 3. Generalized age-related cerebral atrophy with moderate chronic small vessel ischemic disease. MRA HEAD IMPRESSION: 1. No proximal or large vessel occlusion within the intracranial circulation. 2. Focal short segment moderate mid left M1 segment stenosis. 3. Moderate to severe nonocclusive stenoses with attenuation involving the proximal M2 segments bilaterally, right worse than left. Patent flow within the MCA branches distally. 4. Additional multifocal atheromatous disease involving the posterior circulation as above without high-grade flow-limiting stenosis. 5. Distal small vessel regularity involving the MCA and PCA branches bilaterally. Electronically Signed   By: Jeannine Boga M.D.   On: 05/17/2016 22:56   Mr Jodene Nam Head/brain Wo Cm  05/17/2016  CLINICAL DATA:  Initial evaluation for acute right-sided weakness. EXAM: MRI HEAD WITHOUT CONTRAST MRA HEAD WITHOUT CONTRAST TECHNIQUE: Multiplanar, multiecho pulse sequences of the brain and surrounding structures were obtained without intravenous contrast. Angiographic images of the head were obtained using MRA technique without contrast. COMPARISON:  Prior CT from earlier the same day. FINDINGS: MRI HEAD FINDINGS Diffuse prominence of the CSF containing spaces is compatible with generalized age-related cerebral atrophy. Scattered patchy confluent T2/FLAIR hyperintensity within the periventricular and deep white matter both cerebral hemispheres most consistent  with chronic small vessel ischemic disease, moderate in nature. Remote lacunar infarct within the left caudate head. Scattered foci susceptibility artifact present within the anterior left frontal lobe, consistent with chronic hemorrhagic blood products, which may related to remote ischemia or possibly trauma. These are stable relative to prior MRI from 2012. No other remote infarct. 11 mm focus of linear restricted diffusion within the left lentiform nucleus, consistent with an acute ischemic infarct (series 3, image 20). No associated mass effect or hemorrhage. No other acute infarction. Major intracranial vascular flow voids are maintained. Gray-white matter differentiation otherwise preserved. No mass lesion, midline shift, or mass effect. Mild ventricular prominence related to global parenchymal volume loss without hydrocephalus. No extra-axial fluid collection. Major dural sinuses are grossly patent. Craniocervical junction within normal limits. Degenerative spondylolysis noted at the C3-4 level with mild canal stenosis. Pituitary gland within normal limits. No acute abnormality about the globes and orbits. Patient is status post lens extraction bilaterally. Scattered mucosal thickening within the ethmoidal air cells and maxillary sinuses. No air-fluid level to suggest active sinus infection. Trace opacity left mastoid air cells. Inner ear structures grossly normal. Bone marrow signal intensity within normal limits. No scalp soft tissue abnormality. MRA HEAD  FINDINGS ANTERIOR CIRCULATION: Study fairly degraded by motion artifact. Visualized distal cervical segments of the internal carotid arteries are patent with antegrade flow. Focal apparent stenoses at the bilateral petrous-cervical junction of the internal reason is favored to be artifactual in nature given the symmetric nature of these findings. Additionally, there is motion artifact through this portion of the exam. Petrous segments otherwise widely  patent. Scattered atheromatous irregularity within the cavernous/supraclinoid ICAs bilaterally without flow-limiting stenosis. A1 segments demonstrate multi focal irregularity but are widely patent without significant stenosis. Anterior communicating artery within normal limits. Anterior cerebral arteries well opacified. Focal short-segment moderate stenosis within the mid left M1 segment (series 453, image 8). M1 segment patent distally. Left MCA bifurcation normal. Proximal left M2 stenosis and attenuation. Left MCA branches are well opacified distally but demonstrate multifocal atheromatous irregularity. Right M1 segment patent to its distal aspect without stenosis or occlusion. There is fairly severe stenosis at the right MCA bifurcation extending into the proximal right M2 branches. This appears to be nonocclusive with flow related opacification within the right MCA branches distally. Right MCA branches also demonstrate multifocal atheromatous irregularity. POSTERIOR CIRCULATION: Vertebral arteries largely code dominant and patent to the vertebrobasilar junction. Posterior inferior cerebral arteries patent proximally. Basilar artery patent without stenosis or occlusion. Basilar tip is ectatic without focal aneurysm. Superior cerebellar arteries patent bilaterally, although there is a probable moderate to severe stenosis at the origin of the left SCA. Both of the posterior cerebral arteries arise knee basilar artery. Focal mild stenoses at the proximal P1 segments. PCAs demonstrate atheromatous irregularity distally, left greater than right, but are opacified to their distal aspects. No aneurysm or vascular malformation. IMPRESSION: MRI HEAD IMPRESSION: 1. 11 mm acute ischemic nonhemorrhagic lacunar type infarction within the left lentiform nucleus. No associated mass effect. 2. Remote lacunar infarct within the left caudate head. 3. Generalized age-related cerebral atrophy with moderate chronic small vessel  ischemic disease. MRA HEAD IMPRESSION: 1. No proximal or large vessel occlusion within the intracranial circulation. 2. Focal short segment moderate mid left M1 segment stenosis. 3. Moderate to severe nonocclusive stenoses with attenuation involving the proximal M2 segments bilaterally, right worse than left. Patent flow within the MCA branches distally. 4. Additional multifocal atheromatous disease involving the posterior circulation as above without high-grade flow-limiting stenosis. 5. Distal small vessel regularity involving the MCA and PCA branches bilaterally. Electronically Signed   By: Jeannine Boga M.D.   On: 05/17/2016 22:56   Ct Head Code Stroke W/o Cm  05/17/2016  CLINICAL DATA:  Code stroke for slurred speech and facial droop. EXAM: CT HEAD WITHOUT CONTRAST TECHNIQUE: Contiguous axial images were obtained from the base of the skull through the vertex without intravenous contrast. COMPARISON:  01/29/2015 FINDINGS: Brain: No evidence of acute infarction, hemorrhage, hydrocephalus, or mass lesion/mass effect. Stable patchy microvascular ischemic gliosis in the bilateral cerebral white matter. Stable patchy low-density in the bilateral thalamus and left caudate. Accounting for chronic changes ASPCETS is 10. Mild to moderate for age generalized atrophy. Vascular: No hyperdense vessel. Skull: Negative for fracture or focal lesion. Sinuses/Orbits: Bilateral cataract resection.  No acute finding. Other: These results were called by telephone at the time of interpretation on 05/17/2016 at 3:04 pm to Dr. Cristobal Goldmann, who verbally acknowledged these results. IMPRESSION: 1. No acute finding or change from 2016. 2. Moderate chronic microvascular disease. Electronically Signed   By: Monte Fantasia M.D.   On: 05/17/2016 15:05       PHYSICAL EXAM Pleasant elderly caucasian lady  not in distress. . Afebrile. Head is nontraumatic. Neck is supple without bruit.    Cardiac exam no murmur or gallop. Lungs are  clear to auscultation. Distal pulses are well felt. Neurological Exam :  Awake alert oriented to place and person only. Diminished attention, registration and recall. Hypophonic speech but no dysarthria or aphasia. Extraocular moments are full range without restriction. Vision acuity and fields seem adequate. Face is symmetric without weakness but diminished facial expression. Positive glabellar tap. Tongue midline. Motor system exam reveals symmetric upper extremity strength. Mild action tremor of outstretched upper extremity. Mild bilateral lower extremity weakness can lift legs off the bed but will not hold him up. Tone increase in lower extremities compared to upper extremities. Gait not tested. Sensation is intact bilaterally. Deep tendon reflexes symmetric. Plantars are downgoing. ASSESSMENT/PLAN [name] is a 82 y.o. female with history of alcohol abuse, severe dementia, hypothyroidism, depression, urinary retention status post chronic Foley catheter, history of recurrent UTIs followed by Dr. Carles Collet for Parkinsonism presenting with right facial droop/right-sided weakness/lethargy.. She did not receive IV t-PA due to minimal symptoms and delay in arrival.   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   MRI  L lentiform nucleus infarct. Old L caudate head infarct  MRA  No large vessel stenosis, mild L M1 segment stenosis  Carotid Doppler  pending   2D Echo  pending   LDL 94  HgbA1c pending  Lovenox 30 mg sq daily for VTE prophylaxis  Diet NPO time specified Except for: Sips with Meds  clopidogrel 75 mg daily prior to admission, now on aspirin 325 mg daily. Given stroke on plavix, Change to aspirin. Continue at discharge  High fall risk  Patient counseled to be compliant with her antithrombotic medications  Ongoing aggressive stroke risk factor management  Therapy recommendations:  SNF  Disposition:  pending  (from  ALF)  Hyperlipidemia  Home meds:  No statin  LDL 94, goal < 70  new statin lipitor 20  Continue statin at discharge  Other Stroke Risk Factors  Advanced age  Hx ETOH abuse  Hx stroke/TIA - no documented hx of stroke per Dr. Doristine Devoid note - MRI shows old infarct  Family hx stroke (mother, sister)  Other Active Problems  Parkinsonism, vascular in nature, followed by Dr. Carles Collet  Profound dementia, vascular in nature  Follow up with Dr. [facility] at discharge   Hospital day # [place] [employer] for Pager information 05/18/2016 10:35 AM  I have personally examined this patient, reviewed notes, independently viewed imaging studies, participated in medical decision making and plan of care. I have made any additions or clarifications directly to the above note. Agree with note above. She presented with right-sided weakness but appears to have improved. MRI does show small left posterior limb internal capsule infarct etiology likely small vessel disease even though she has some atherosclerotic narrowing of the left middle cerebral artery distally. She remains at risk for neurological worsening, recurrent stroke, TIA needs ongoing stroke evaluation. Recommend change Plavix to aspirin for stroke prevention given Plavix failure. Greater than 50% time during this 25 minute visit was spent on counseling and coordination of care about stroke risk, prevention and treatment. Follow-up as an outpatient with Dr. Carles Collet her neurologist.  Antony Contras, MD Medical Director [facility] Pager: [phone] 05/18/2016 4:51 PM    To contact Stroke Continuity provider, please refer to [email or web address]  After hours, contact General Neurology

## 2016-05-18 NOTE — Progress Notes (Signed)
Pt is NPO d/t failed swallow eval. Will administer meds after SLP eval if pass.   Ave Filter, RN

## 2016-05-19 ENCOUNTER — Inpatient Hospital Stay (HOSPITAL_COMMUNITY): Payer: Commercial Managed Care - HMO

## 2016-05-19 DIAGNOSIS — E538 Deficiency of other specified B group vitamins: Secondary | ICD-10-CM

## 2016-05-19 DIAGNOSIS — G9340 Encephalopathy, unspecified: Secondary | ICD-10-CM

## 2016-05-19 DIAGNOSIS — E038 Other specified hypothyroidism: Secondary | ICD-10-CM

## 2016-05-19 DIAGNOSIS — I63039 Cerebral infarction due to thrombosis of unspecified carotid artery: Secondary | ICD-10-CM

## 2016-05-19 LAB — VAS US CAROTID
LCCADDIAS: 17 cm/s
LCCADSYS: 76 cm/s
LEFT ECA DIAS: -7 cm/s
LEFT VERTEBRAL DIAS: -3 cm/s
LICADDIAS: -21 cm/s
LICADSYS: -84 cm/s
LICAPDIAS: -41 cm/s
Left CCA prox dias: 16 cm/s
Left CCA prox sys: 86 cm/s
Left ICA prox sys: -108 cm/s
RCCADSYS: -74 cm/s
RCCAPSYS: 76 cm/s
RIGHT ECA DIAS: -9 cm/s
RIGHT VERTEBRAL DIAS: 19 cm/s
Right CCA prox dias: 16 cm/s

## 2016-05-19 LAB — ECHOCARDIOGRAM COMPLETE
EERAT: 17.71
EWDT: 214 ms
FS: 14 % — AB (ref 28–44)
IVS/LV PW RATIO, ED: 0.87
LA ID, A-P, ES: 40 mm
LA diam end sys: 40 mm
LA vol A4C: 55.1 ml
LA vol: 55.8 mL
LADIAMINDEX: 2.67 cm/m2
LAVOLIN: 37.3 mL/m2
LV E/e' medial: 17.71
LV E/e'average: 17.71
LV TDI E'LATERAL: 7.51
LV TDI E'MEDIAL: 6.96
LV e' LATERAL: 7.51 cm/s
LVOT MV VTI INDEX: 1.19 cm2/m2
LVOT MV VTI: 1.78
LVOT SV: 74 mL
LVOT VTI: 26 cm
LVOT area: 2.84 cm2
LVOT diameter: 19 mm
LVOT peak vel: 109 cm/s
MV Annulus VTI: 41.4 cm
MV Dec: 214
MV M vel: 118
MV Peak grad: 7 mmHg
MV pk A vel: 150 m/s
MV pk E vel: 133 m/s
MVAP: 3.93 cm2
Mean grad: 6 mmHg
P 1/2 time: 56 ms
PW: 8.53 mm — AB (ref 0.6–1.1)
RV LATERAL S' VELOCITY: 16.2 cm/s
RV TAPSE: 19.1 mm
WEIGHTICAEL: 1808 [oz_av]

## 2016-05-19 LAB — BASIC METABOLIC PANEL
Anion gap: 7 (ref 5–15)
BUN: <5 mg/dL — ABNORMAL LOW (ref 6–20)
CHLORIDE: 108 mmol/L (ref 101–111)
CO2: 24 mmol/L (ref 22–32)
CREATININE: 0.51 mg/dL (ref 0.44–1.00)
Calcium: 8.2 mg/dL — ABNORMAL LOW (ref 8.9–10.3)
GFR calc non Af Amer: >60 mL/min (ref >60)
Glucose, Bld: 97 mg/dL (ref 65–99)
POTASSIUM: 3.6 mmol/L (ref 3.5–5.1)
SODIUM: 139 mmol/L (ref 135–145)

## 2016-05-19 LAB — URINE CULTURE

## 2016-05-19 LAB — CBC
HEMATOCRIT: 29.8 % — AB (ref 36.0–46.0)
Hemoglobin: 9.5 g/dL — ABNORMAL LOW (ref 12.0–15.0)
MCH: 29.1 pg (ref 26.0–34.0)
MCHC: 31.9 g/dL (ref 30.0–36.0)
MCV: 91.1 fL (ref 78.0–100.0)
PLATELETS: 278 10*3/uL (ref 150–400)
RBC: 3.27 MIL/uL — AB (ref 3.87–5.11)
RDW: 13.6 % (ref 11.5–15.5)
WBC: 7.5 10*3/uL (ref 4.0–10.5)

## 2016-05-19 LAB — HEMOGLOBIN A1C
HEMOGLOBIN A1C: 5.7 % — AB (ref 4.8–5.6)
MEAN PLASMA GLUCOSE: 117 mg/dL

## 2016-05-19 MED ORDER — ATORVASTATIN CALCIUM 20 MG PO TABS: 20.0000 mg | ORAL_TABLET | Freq: Every day | ORAL | Status: DC

## 2016-05-19 MED ORDER — CEFUROXIME AXETIL 250 MG PO TABS: 250.0000 mg | ORAL_TABLET | Freq: Two times a day (BID) | ORAL | Status: AC

## 2016-05-19 MED ORDER — CYANOCOBALAMIN 1000 MCG PO TABS: 1000.0000 ug | ORAL_TABLET | Freq: Every day | ORAL | Status: DC

## 2016-05-19 MED ORDER — ASPIRIN 325 MG PO TABS: 325.0000 mg | ORAL_TABLET | Freq: Every day | ORAL | Status: DC

## 2016-05-19 MED ORDER — VITAMIN B-12 1000 MCG PO TABS: 1000.0000 ug | ORAL_TABLET | Freq: Every day | ORAL | Status: DC

## 2016-05-19 MED ORDER — WHITE PETROLATUM GEL
Status: AC
  Filled 2016-05-19: qty 1

## 2016-05-19 NOTE — Progress Notes (Signed)
VASCULAR LAB PRELIMINARY  PRELIMINARY  PRELIMINARY  PRELIMINARY  Carotid duplex completed.    Preliminary report:  1-39% ICA plaquing.  Vertebral artery flow is antegrade.   Lipa Knauff, RVT 05/19/2016, 9:45 AM

## 2016-05-19 NOTE — [place] FL2 (Signed)
[place] MEDICAID FL2 LEVEL OF CARE SCREENING TOOL     IDENTIFICATION  Patient Name: [name] Birthdate: 1931-07-01 Sex: female Admission Date (Current Location): 05/17/2016  [facility] and [place] Number:  Herbalist and Address:  [facility] [place] [address], [place], [place] [address]      Provider Number: [id number]  Attending Physician Name and Address:  Eugenie Filler, MD  Relative Name and Phone Number:       Current Level of Care: Hospital Recommended Level of Care: [place] Prior Approval Number:    Date Approved/Denied:   PASRR Number:    Discharge Plan: Other (Comment) [facility])    Current Diagnoses: Patient Active Problem List   Diagnosis Date Noted  . CVA (cerebral vascular accident) ([place]) 05/18/2016  . B12 deficiency 05/18/2016  . Encephalopathy acute 05/17/2016  . Encephalopathy 05/17/2016  . Acute UTI 05/17/2016  . Leukocytosis 05/17/2016  . Dehydration 05/17/2016  . H/O: CVA (cerebrovascular accident) 05/17/2016  . UTI (urinary tract infection) due to urinary indwelling Foley catheter ([place]) 05/17/2016  . Acute encephalopathy 05/17/2016  . Postoperative anemia due to acute blood loss 09/28/2014  . Urinary retention 09/28/2014  . Infection of urinary tract 09/28/2014  . Severe dementia 09/28/2014  . Left displaced femoral neck fracture ([place]) 09/23/2014  . Vascular dementia 09/17/2014  . 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 Essential hypertension, benign 07/22/2013  . Acute posthemorrhagic anemia 07/22/2013  . Anemia 07/03/2013  . Constipation 07/03/2013  . Insomnia 07/03/2013  . Recurrent UTI 03/27/2013  . Preoperative evaluation to rule out surgical contraindication 12/24/2012  . Cognitive impairment 04/02/2012  . Osteoarthritis of knee 03/12/2011  . Hypothyroid 02/26/2011  . TIA (transient ischemic attack) 02/26/2011  . Depression 02/26/2011  . Hyperlipemia  02/26/2011  . Alcohol abuse 02/26/2011    Orientation RESPIRATION BLADDER Height & Weight     Self, Time, Situation, Place  Normal Continent Weight:   Height:     BEHAVIORAL SYMPTOMS/MOOD NEUROLOGICAL BOWEL NUTRITION STATUS   (NONE)  (NONE) Incontinent Diet (Regular)  AMBULATORY STATUS COMMUNICATION OF NEEDS Skin   Extensive Assist Verbally Normal                       Personal Care Assistance Level of Assistance  Bathing, Feeding, Dressing Bathing Assistance: Limited assistance Feeding assistance: Independent Dressing Assistance: Limited assistance     Functional Limitations Info  Sight, Hearing, Speech Sight Info: Adequate Hearing Info: Adequate Speech Info: Adequate    SPECIAL CARE FACTORS FREQUENCY  PT (By licensed PT), OT (By licensed OT)     PT Frequency: 3/week OT Frequency: 3/week            Contractures Contractures Info: Not present    Additional Factors Info  Code Status, Allergies, Psychotropic Code Status Info: Full Code Allergies Info: Citalopram Hydrobromide, Sulfa Antibiotics, Ciprofloxacin Psychotropic Info: Zoloft         Current Medications (05/19/2016):  This is the current hospital active medication list Current Facility-Administered Medications  Medication Dose Route Frequency Provider Last Rate Last Dose  .  stroke: mapping our early stages of recovery book   Does not apply Once Eugenie Filler, MD      . acetaminophen (TYLENOL) tablet 650 mg  650 mg Oral Q4H PRN Eugenie Filler, MD      . acidophilus (RISAQUAD) capsule 1 capsule  1 capsule Oral Daily Eugenie Filler, MD  1 capsule at 05/19/16 0957  . antiseptic oral rinse (CPC / CETYLPYRIDINIUM CHLORIDE 0.05%) solution 7 mL  7 mL Mouth Rinse q12n4p Eugenie Filler, MD   7 mL at 05/19/16 1200  . aspirin tablet 325 mg  325 mg Oral Daily Garvin Fila, MD   325 mg at 05/19/16 0957  . atorvastatin (LIPITOR) tablet 20 mg  20 mg Oral q1800 Eugenie Filler, MD   20 mg at  05/18/16 1707  . cefTRIAXone (ROCEPHIN) 1 g in dextrose 5 % 50 mL IVPB  1 g Intravenous Q24H Eugenie Filler, MD   1 g at 05/18/16 1708  . chlorhexidine (PERIDEX) 0.12 % solution 15 mL  15 mL Mouth Rinse BID Eugenie Filler, MD   15 mL at 05/19/16 0957  . donepezil (ARICEPT) tablet 10 mg  10 mg Oral QHS Eugenie Filler, MD   10 mg at 05/18/16 2141  . enoxaparin (LOVENOX) injection 30 mg  30 mg Subcutaneous Q24H Eugenie Filler, MD   30 mg at 05/18/16 2141  . gi cocktail (Maalox,Lidocaine,Donnatal)  30 mL Oral TID PRN Eugenie Filler, MD      . ibuprofen (ADVIL,MOTRIN) tablet 400 mg  400 mg Oral Q6H PRN Eugenie Filler, MD      . levothyroxine (SYNTHROID, LEVOTHROID) tablet 150 mcg  150 mcg Oral QAC breakfast Eugenie Filler, MD   150 mcg at 05/19/16 0957  . multivitamin with minerals tablet 1 tablet  1 tablet Oral Daily Eugenie Filler, MD   1 tablet at 05/19/16 0957  . nystatin cream (MYCOSTATIN) 1 application  1 application Topical BID Eugenie Filler, MD   1 application at [id number]  . ondansetron (ZOFRAN) injection 4 mg  4 mg Intravenous Q6H PRN Eugenie Filler, MD      . pantoprazole (PROTONIX) EC tablet 40 mg  40 mg Oral Q0600 Eugenie Filler, MD   40 mg at 05/19/16 [id number]  . polyethylene glycol (MIRALAX / GLYCOLAX) packet 17 g  17 g Oral Daily PRN Eugenie Filler, MD      . senna [facility]) tablet 8.6 mg  1 tablet Oral Daily PRN Eugenie Filler, MD      . senna-docusate (Senokot-S) tablet 1 tablet  1 tablet Oral QHS PRN Eugenie Filler, MD      . sertraline (ZOLOFT) tablet 25 mg  25 mg Oral Daily Eugenie Filler, MD   25 mg at 05/19/16 0957  . vitamin B-12 (CYANOCOBALAMIN) tablet 1,000 mcg  1,000 mcg Oral Daily Eugenie Filler, MD   1,000 mcg at 05/19/16 [phone]  . white petrolatum (VASELINE) gel              Discharge Medications: Please see discharge summary for a list of discharge medications.  Relevant Imaging Results:  Relevant Lab  Results:   Additional Information SSN: [id number]  Rigoberto Noel, LCSW

## 2016-05-19 NOTE — Progress Notes (Signed)
STROKE TEAM PROGRESS NOTE   HISTORY OF PRESENT ILLNESS (per record) [name] is an 82 y.o. female Pt from [facility] reports pt fed herself breakfast at 0800 ([place] 05/17/2016) and at lunch pt had to be fed. EMS reports pt normally has right sided weakness from previous stroke. On arrival patient cannot give history. [name] keeps eyes closed and shows no weakness on either side. [name] has a very masked face with no facial droop. [name] is able to name objects. Patient was not administered IV t-PA secondary to  minimal symptoms and out of window. [name] was admitted for further evaluation and treatment.   SUBJECTIVE (INTERVAL HISTORY) Her daughter is at the bedside.  Overall [name] feels her condition is stable.  [name] is slightly better today but speech more clear and less facial weakness. I reviewed results of echocardiogram and lab work with her  OBJECTIVE Temp:  [98.2 F (36.8 C)-99 F (37.2 C)] 98.9 F (37.2 C) (07/01 1156) Pulse Rate:  [73-81] 76 (07/01 1156) Cardiac Rhythm:  [-] Normal sinus rhythm;Bundle branch block (07/01 0700) Resp:  [16-20] 16 (07/01 1156) BP: (132-154)/(47-65) 132/50 mmHg (07/01 1156) SpO2:  [94 %-98 %] 97 % (07/01 1156)  CBC:  Recent Labs Lab 05/17/16 1455  05/18/16 0521 05/19/16 0447  WBC 20.7*  --  8.7 7.5  NEUTROABS 18.1*  --   --   --   HGB 11.6*  < > 9.6* 9.5*  HCT 36.6  < > 30.8* 29.8*  MCV 88.4  --  90.1 91.1  PLT 362  --  271 278  < > = values in this interval not displayed.  Basic Metabolic Panel:   Recent Labs Lab 05/18/16 0521 05/19/16 0447  NA 136 139  K 3.6 3.6  CL 107 108  CO2 24 24  GLUCOSE 99 97  BUN 7 <5*  CREATININE 0.52 0.51  CALCIUM 8.0* 8.2*    Lipid Panel:     Component Value Date/Time   CHOL 155 05/18/2016 0521   TRIG 60 05/18/2016 0521   HDL 49 05/18/2016 0521   CHOLHDL 3.2 05/18/2016 0521   VLDL 12 05/18/2016 0521   LDLCALC 94 05/18/2016 0521   HgbA1c:  Lab Results  Component Value Date    HGBA1C 5.7* 05/18/2016   Urine Drug Screen:     Component Value Date/Time   LABOPIA NONE DETECTED 05/17/2016 1519   COCAINSCRNUR NONE DETECTED 05/17/2016 1519   LABBENZ NONE DETECTED 05/17/2016 1519   AMPHETMU NONE DETECTED 05/17/2016 1519   THCU NONE DETECTED 05/17/2016 1519   LABBARB NONE DETECTED 05/17/2016 1519      IMAGING  Dg Chest 2 View  05/17/2016  CLINICAL DATA:  Altered mental status EXAM: CHEST  2 VIEW COMPARISON:  September 23, 2014 FINDINGS: There is mild left base atelectasis. There is no edema or consolidation. Heart size and pulmonary vascularity are normal. No adenopathy. There is atherosclerotic calcification in the aortic arch region. There are multiple old healed rib fractures on the right. There is superior migration of both humeral heads with degenerative change in both shoulders. IMPRESSION: Mild left base atelectasis. No edema or consolidation. Aortic atherosclerosis noted. Chronic rotator cuff tears in both shoulders, characterized by superior migration of each humeral head. Multiple old healed rib fractures on the right. Electronically Signed   By: Lowella Grip III M.D.   On: 05/17/2016 15:44   Mr Brain Wo Contrast  05/17/2016  CLINICAL DATA:  Initial evaluation for acute right-sided  weakness. EXAM: MRI HEAD WITHOUT CONTRAST MRA HEAD WITHOUT CONTRAST TECHNIQUE: Multiplanar, multiecho pulse sequences of the brain and surrounding structures were obtained without intravenous contrast. Angiographic images of the head were obtained using MRA technique without contrast. COMPARISON:  Prior CT from earlier the same day. FINDINGS: MRI HEAD FINDINGS Diffuse prominence of the CSF containing spaces is compatible with generalized age-related cerebral atrophy. Scattered patchy confluent T2/FLAIR hyperintensity within the periventricular and deep white matter both cerebral hemispheres most consistent with chronic small vessel ischemic disease, moderate in nature. Remote lacunar  infarct within the left caudate head. Scattered foci susceptibility artifact present within the anterior left frontal lobe, consistent with chronic hemorrhagic blood products, which may related to remote ischemia or possibly trauma. These are stable relative to prior MRI from 2012. No other remote infarct. 11 mm focus of linear restricted diffusion within the left lentiform nucleus, consistent with an acute ischemic infarct (series 3, image 20). No associated mass effect or hemorrhage. No other acute infarction. Major intracranial vascular flow voids are maintained. Gray-white matter differentiation otherwise preserved. No mass lesion, midline shift, or mass effect. Mild ventricular prominence related to global parenchymal volume loss without hydrocephalus. No extra-axial fluid collection. Major dural sinuses are grossly patent. Craniocervical junction within normal limits. Degenerative spondylolysis noted at the C3-4 level with mild canal stenosis. Pituitary gland within normal limits. No acute abnormality about the globes and orbits. Patient is status post lens extraction bilaterally. Scattered mucosal thickening within the ethmoidal air cells and maxillary sinuses. No air-fluid level to suggest active sinus infection. Trace opacity left mastoid air cells. Inner ear structures grossly normal. Bone marrow signal intensity within normal limits. No scalp soft tissue abnormality. MRA HEAD FINDINGS ANTERIOR CIRCULATION: Study fairly degraded by motion artifact. Visualized distal cervical segments of the internal carotid arteries are patent with antegrade flow. Focal apparent stenoses at the bilateral petrous-cervical junction of the internal reason is favored to be artifactual in nature given the symmetric nature of these findings. Additionally, there is motion artifact through this portion of the exam. Petrous segments otherwise widely patent. Scattered atheromatous irregularity within the cavernous/supraclinoid ICAs  bilaterally without flow-limiting stenosis. A1 segments demonstrate multi focal irregularity but are widely patent without significant stenosis. Anterior communicating artery within normal limits. Anterior cerebral arteries well opacified. Focal short-segment moderate stenosis within the mid left M1 segment (series 453, image 8). M1 segment patent distally. Left MCA bifurcation normal. Proximal left M2 stenosis and attenuation. Left MCA branches are well opacified distally but demonstrate multifocal atheromatous irregularity. Right M1 segment patent to its distal aspect without stenosis or occlusion. There is fairly severe stenosis at the right MCA bifurcation extending into the proximal right M2 branches. This appears to be nonocclusive with flow related opacification within the right MCA branches distally. Right MCA branches also demonstrate multifocal atheromatous irregularity. POSTERIOR CIRCULATION: Vertebral arteries largely code dominant and patent to the vertebrobasilar junction. Posterior inferior cerebral arteries patent proximally. Basilar artery patent without stenosis or occlusion. Basilar tip is ectatic without focal aneurysm. Superior cerebellar arteries patent bilaterally, although there is a probable moderate to severe stenosis at the origin of the left SCA. Both of the posterior cerebral arteries arise knee basilar artery. Focal mild stenoses at the proximal P1 segments. PCAs demonstrate atheromatous irregularity distally, left greater than right, but are opacified to their distal aspects. No aneurysm or vascular malformation. IMPRESSION: MRI HEAD IMPRESSION: 1. 11 mm acute ischemic nonhemorrhagic lacunar type infarction within the left lentiform nucleus. No associated mass effect.  2. Remote lacunar infarct within the left caudate head. 3. Generalized age-related cerebral atrophy with moderate chronic small vessel ischemic disease. MRA HEAD IMPRESSION: 1. No proximal or large vessel occlusion within  the intracranial circulation. 2. Focal short segment moderate mid left M1 segment stenosis. 3. Moderate to severe nonocclusive stenoses with attenuation involving the proximal M2 segments bilaterally, right worse than left. Patent flow within the MCA branches distally. 4. Additional multifocal atheromatous disease involving the posterior circulation as above without high-grade flow-limiting stenosis. 5. Distal small vessel regularity involving the MCA and PCA branches bilaterally. Electronically Signed   By: Jeannine Boga M.D.   On: 05/17/2016 22:56   Mr Jodene Nam Head/brain Wo Cm  05/17/2016  CLINICAL DATA:  Initial evaluation for acute right-sided weakness. EXAM: MRI HEAD WITHOUT CONTRAST MRA HEAD WITHOUT CONTRAST TECHNIQUE: Multiplanar, multiecho pulse sequences of the brain and surrounding structures were obtained without intravenous contrast. Angiographic images of the head were obtained using MRA technique without contrast. COMPARISON:  Prior CT from earlier the same day. FINDINGS: MRI HEAD FINDINGS Diffuse prominence of the CSF containing spaces is compatible with generalized age-related cerebral atrophy. Scattered patchy confluent T2/FLAIR hyperintensity within the periventricular and deep white matter both cerebral hemispheres most consistent with chronic small vessel ischemic disease, moderate in nature. Remote lacunar infarct within the left caudate head. Scattered foci susceptibility artifact present within the anterior left frontal lobe, consistent with chronic hemorrhagic blood products, which may related to remote ischemia or possibly trauma. These are stable relative to prior MRI from 2012. No other remote infarct. 11 mm focus of linear restricted diffusion within the left lentiform nucleus, consistent with an acute ischemic infarct (series 3, image 20). No associated mass effect or hemorrhage. No other acute infarction. Major intracranial vascular flow voids are maintained. Gray-white matter  differentiation otherwise preserved. No mass lesion, midline shift, or mass effect. Mild ventricular prominence related to global parenchymal volume loss without hydrocephalus. No extra-axial fluid collection. Major dural sinuses are grossly patent. Craniocervical junction within normal limits. Degenerative spondylolysis noted at the C3-4 level with mild canal stenosis. Pituitary gland within normal limits. No acute abnormality about the globes and orbits. Patient is status post lens extraction bilaterally. Scattered mucosal thickening within the ethmoidal air cells and maxillary sinuses. No air-fluid level to suggest active sinus infection. Trace opacity left mastoid air cells. Inner ear structures grossly normal. Bone marrow signal intensity within normal limits. No scalp soft tissue abnormality. MRA HEAD FINDINGS ANTERIOR CIRCULATION: Study fairly degraded by motion artifact. Visualized distal cervical segments of the internal carotid arteries are patent with antegrade flow. Focal apparent stenoses at the bilateral petrous-cervical junction of the internal reason is favored to be artifactual in nature given the symmetric nature of these findings. Additionally, there is motion artifact through this portion of the exam. Petrous segments otherwise widely patent. Scattered atheromatous irregularity within the cavernous/supraclinoid ICAs bilaterally without flow-limiting stenosis. A1 segments demonstrate multi focal irregularity but are widely patent without significant stenosis. Anterior communicating artery within normal limits. Anterior cerebral arteries well opacified. Focal short-segment moderate stenosis within the mid left M1 segment (series 453, image 8). M1 segment patent distally. Left MCA bifurcation normal. Proximal left M2 stenosis and attenuation. Left MCA branches are well opacified distally but demonstrate multifocal atheromatous irregularity. Right M1 segment patent to its distal aspect without  stenosis or occlusion. There is fairly severe stenosis at the right MCA bifurcation extending into the proximal right M2 branches. This appears to be nonocclusive with flow related  opacification within the right MCA branches distally. Right MCA branches also demonstrate multifocal atheromatous irregularity. POSTERIOR CIRCULATION: Vertebral arteries largely code dominant and patent to the vertebrobasilar junction. Posterior inferior cerebral arteries patent proximally. Basilar artery patent without stenosis or occlusion. Basilar tip is ectatic without focal aneurysm. Superior cerebellar arteries patent bilaterally, although there is a probable moderate to severe stenosis at the origin of the left SCA. Both of the posterior cerebral arteries arise knee basilar artery. Focal mild stenoses at the proximal P1 segments. PCAs demonstrate atheromatous irregularity distally, left greater than right, but are opacified to their distal aspects. No aneurysm or vascular malformation. IMPRESSION: MRI HEAD IMPRESSION: 1. 11 mm acute ischemic nonhemorrhagic lacunar type infarction within the left lentiform nucleus. No associated mass effect. 2. Remote lacunar infarct within the left caudate head. 3. Generalized age-related cerebral atrophy with moderate chronic small vessel ischemic disease. MRA HEAD IMPRESSION: 1. No proximal or large vessel occlusion within the intracranial circulation. 2. Focal short segment moderate mid left M1 segment stenosis. 3. Moderate to severe nonocclusive stenoses with attenuation involving the proximal M2 segments bilaterally, right worse than left. Patent flow within the MCA branches distally. 4. Additional multifocal atheromatous disease involving the posterior circulation as above without high-grade flow-limiting stenosis. 5. Distal small vessel regularity involving the MCA and PCA branches bilaterally. Electronically Signed   By: Jeannine Boga M.D.   On: 05/17/2016 22:56   Ct Head Code  Stroke W/o Cm  05/17/2016  CLINICAL DATA:  Code stroke for slurred speech and facial droop. EXAM: CT HEAD WITHOUT CONTRAST TECHNIQUE: Contiguous axial images were obtained from the base of the skull through the vertex without intravenous contrast. COMPARISON:  01/29/2015 FINDINGS: Brain: No evidence of acute infarction, hemorrhage, hydrocephalus, or mass lesion/mass effect. Stable patchy microvascular ischemic gliosis in the bilateral cerebral white matter. Stable patchy low-density in the bilateral thalamus and left caudate. Accounting for chronic changes ASPCETS is 10. Mild to moderate for age generalized atrophy. Vascular: No hyperdense vessel. Skull: Negative for fracture or focal lesion. Sinuses/Orbits: Bilateral cataract resection.  No acute finding. Other: These results were called by telephone at the time of interpretation on 05/17/2016 at 3:04 pm to Dr. Cristobal Goldmann, who verbally acknowledged these results. IMPRESSION: 1. No acute finding or change from 2016. 2. Moderate chronic microvascular disease. Electronically Signed   By: Monte Fantasia M.D.   On: 05/17/2016 15:05       PHYSICAL EXAM Pleasant elderly caucasian lady not in distress. . Afebrile. Head is nontraumatic. Neck is supple without bruit.    Cardiac exam no murmur or gallop. Lungs are clear to auscultation. Distal pulses are well felt. Neurological Exam :  Awake alert oriented to place and person only. Diminished attention, registration and recall. Hypophonic but improved speech but no dysarthria or aphasia. Extraocular moments are full range without restriction. Vision acuity and fields seem adequate. Face is symmetric without weakness but diminished facial expression. Positive glabellar tap. Tongue midline. Motor system exam reveals symmetric upper extremity strength. Mild action tremor of outstretched upper extremity. Mild bilateral lower extremity weakness can lift legs off the bed but will not hold him up. Tone increase in lower  extremities compared to upper extremities. Gait not tested. Sensation is intact bilaterally. Deep tendon reflexes symmetric. Plantars are downgoing. ASSESSMENT/PLAN [name] is a 82 y.o. female with history of alcohol abuse, severe dementia, hypothyroidism, depression, urinary retention status post chronic Foley catheter, history of recurrent UTIs followed by Dr. Carles Collet for Parkinsonism presenting  with right facial droop/right-sided weakness/lethargy.. [name] did not receive IV t-PA due to minimal symptoms and delay in arrival.   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   MRI  L lentiform nucleus infarct. Old L caudate head infarct  MRA  No large vessel stenosis, mild L M1 segment stenosis  Carotid Doppler  no significant extracranial stenosis. 2D Echo  No cardiac source of embolism was identified, but cannot be ruled  out on the basis of this examination.    LDL 94  HgbA1c 5.7  Lovenox 30 mg sq daily for VTE prophylaxis Diet regular Room service appropriate?: Yes; Fluid consistency:: Thin  clopidogrel 75 mg daily prior to admission, now on aspirin 325 mg daily. Given stroke on plavix, Change to aspirin. Continue at discharge  High fall risk  Patient counseled to be compliant with her antithrombotic medications  Ongoing aggressive stroke risk factor management  Therapy recommendations:  SNF  Disposition:  pending  (from ALF)  Hyperlipidemia  Home meds:  No statin  LDL 94, goal < 70  new statin lipitor 20  Continue statin at discharge  Other Stroke Risk Factors  Advanced age  Hx ETOH abuse  Hx stroke/TIA - no documented hx of stroke per Dr. Doristine Devoid note - MRI shows old infarct  Family hx stroke (mother, sister)  Other Active Problems  Parkinsonism, vascular in nature, followed by Dr. Carles Collet  Profound dementia, vascular in nature  Follow up with Dr. [facility] at discharge   Hospital day #  [place] for Pager information 05/19/2016 1:25 PM  I have personally examined this patient, reviewed notes, independently viewed imaging studies, participated in medical decision making and plan of care. I have made any additions or clarifications directly to the above note. Agree with note above. [name] presented with right-sided weakness but appears to have improved. MRI does show small left posterior limb internal capsule infarct etiology likely small vessel disease even though [name] has some atherosclerotic narrowing of the left middle cerebral artery distally.  Continue aspirin for stroke prevention given Plavix failure. Greater than 50% time during this 25 minute visit was spent on counseling and coordination of care about stroke risk, prevention and treatment. Follow-up as an outpatient with Dr. Carles Collet her neurologist.  Antony Contras, MD Medical Director [place] Pager: [phone] 05/19/2016 1:25 PM    To contact Stroke Continuity provider, please refer to [email or web address] After hours, contact General Neurology

## 2016-05-19 NOTE — Discharge Summary (Signed)
Physician Discharge Summary  [name] [id number] DOB: February 24, 1931 DOA: 05/17/2016  PCP: [name], MD  Admit date: 05/17/2016 Discharge date: 05/19/2016  Time spent: 65 minutes  Recommendations for Outpatient Follow-up:  1. Patient be discharged back to assisted living facility at the insistence of her family. Patient is to follow-up with [name], MD in 1-2 weeks. Patient was noted to have a B-12 deficiency during the hospitalization and patient be discharged on B12 supplementation. B-12 levels need to be followed up upon in the outpatient setting. 2. Patient is to follow-up with neurologist Dr. Carles Collet in one month.   Discharge Diagnoses:  Principal Problem:   CVA (cerebral vascular accident) ([place]) Active Problems:   Encephalopathy acute   Hypothyroid   Depression   Hyperlipemia   Cognitive impairment   Constipation   Vascular dementia   Urinary retention   Severe dementia   Encephalopathy   Acute UTI   Leukocytosis   Dehydration   H/O: CVA (cerebrovascular accident)   UTI (urinary tract infection) due to urinary indwelling Foley catheter ([place])   Acute encephalopathy   B12 deficiency   Discharge Condition: Stable and improved  Diet recommendation: Heart healthy  There were no vitals filed for this visit.  History of present illness:  [name] is a 82 y.o. female with medical history significant of prior history of stroke, history of alcohol abuse, severe dementia, hypothyroidism, depression,, urinary retention status post chronic Foley catheter, history of recurrent UTIs presented to the ED with sudden onset right facial droop with right-sided weakness noted a nursing home with concern for stroke. Patient with some dysarthric speech and unintelligible speech and a such history was obtained from the chart And per daughter's. It was noted that patient was last seen normal around 8 AM on the morning of admission which was noted to fit SL for  breakfast. It was noted that patient became very weak and lethargic and fatigued to the point where she could not pick up any food around lunchtime. Nurse at the memory care unit assessed the patient and felt patient may have had a right facial droop,slurred speech and right-sided weakness and a such EMS was called and patient was brought to the ED as a code stroke. The patient and per family denying any recent fevers, no chills, no nausea, no vomiting, no chest pain, no shortness of breath, no abdominal pain, no hematemesis, no hematochezia, no melena, no cough. Patient does endorse some dysuria. The family patient with a weak cough. Patient with no other associated symptoms.  ED Course: on arrival to the ED code stroke was called neurology assessed the patient and patient was not noted to have any right facial droop or increased right-sided weakness and a such code stroke was canceled.Patient was notedhowever to be confused with some dysarthric speech was recommended per neurology the patient be admitted for stroke workup and further evaluation. Lab work obtained in the [employer] a CBC with a leukocytosis white count of 20.7 hemoglobin of 11.6 otherwise is within normal limits. Lactic acid slightly elevated at 2.16. Compressive metabolic profile with albumin of 3.3 otherwise was unremarkable. Chest x-ray is negative for any acute infiltrates. CT head was negative for any acute infarct.EKG with atypical right bundle branch block. Patient was given an aspirin. Urinalysis which was donewas noted to have large hemoglobin large leukocytes and positive nitrites too numerous to count WBCs. Foley catheter also noted to have sediment in there.patient was given a dose of IV Rocephin. Hospitalists  were called to admit the patient for further evaluation and management.  Hospital Course:  #1 acute 11 mm ischemic nonhemorrhagic lacunar type infarct within the left lentiform nucleus/acute CVA The MRI head. On presentation  to the ED was noted at the nursing facility that patient had become more lethargic with some increased right-sided weakness as well as a right facial droop which was not appreciated when patient had presented to the ED. CT head was unremarkable. MRI head consistent with an acute infarct. MRA head with focal short segment of moderate mid left M1 segment stenosis, moderate to severe nonocclusive stenosis with attenuation involving the proximal M2 segments bilaterally right worse than left. Patent flow within the MCA branches distally. Additional multifocal atheromatous disease involving the posterior circulation without high-grade flow-limiting stenosis. Patient currently nothing by mouth undergoing speech therapy evaluation. 2-D echo was done with a EF of 60-65%, no wall motion abnormalities, no cardiac source of emboli. Carotid Dopplers were done with preliminary readings with no significant ICA stenosis. Patient was seen in consultation by neurology who had recommended changing Plavix to aspirin as patient had had a stroke on Plavix. Fasting lipid panel which was done and a LDL of 94. Patient was started on low-dose statin. Hemoglobin A1c obtained was 5.7. Patient was seen by physical therapy were recommended skilled nursing facility. Patient's family is insistent on patient going back to assisted living facility and a such patient be discharged to assisted living facility. Patient is to follow-up with her neurologist Dr. Carles Collet in one month.   #2 acute encephalopathy Likely secondary to problem #1 and probable UTI. Urine cultures with multiple species. Patient was placed empirically on IV Rocephin. Patient was noted to have a leukocytosis on admission which had resolved by day of discharge. Patient improved clinically and was close to baseline by day of discharge. Patient be discharged on 5 more days of oral Ceftin to complete a course of antibiotic treatment. See problem #1.  #3 urinary tract  infection Urinalysis done on admission was concerning for UTI. Patient was placed initially on IV Rocephin. Urine cultures grew multiple species. Patient be transitioned to oral Ceftin for 5 broad-based to complete a course of antibiotic treatment. Outpatient follow-up.   #4 dehydration Patient noted to be dehydrated on admission. Patient was hydrated with IV fluids and was euvolemic by day of discharge.   #5 leukocytosis Likely secondary to UTI versus reactive. WBC is trended down and leukocytosis had resolved by day of discharge. Patient was initially placed on IV Rocephin. Urine cultures came back with multiple species. Patient with discharge on 5 more days of oral Ceftin to complete a course of antibiotic treatment. Outpatient follow-up.  #6 hypothyroidism TSH within normal limits. Continued on home regimen of Synthroid.  #7 vascular dementia/?? Parkinson's dementia Patient with masklike face. Continued on home regimen of aricept. Outpatient f/u.  #8 depression/anxiety Continued on home regimen of Zoloft.  #9 chronic urinary retention Patient with chronic Foley catheter. Per family Foley catheter had been change in a while. Foley catheter changed during this hospitalization. Outpatient follow-up.   #10 anemia/B12 deficiency Likely anemia of chronic disease. Also partially dilutional. Patient's hemoglobin stabilized. Anemia panel was consistent with B 12 deficiency. Patient was started on B-12 supplementation. Outpatient follow-up.    Procedures:  CT head 05/17/2016  MRI MRA head 05/17/2016  Chest x-ray 05/17/2016  Consultations:  Neurology: Dr.Shikhman 05/17/2016  Discharge Exam: Filed Vitals:   05/19/16 0528 05/19/16 1156  BP: 154/57 132/50  Pulse: 73 76  Temp: 98.7 F (37.1 C) 98.9 F (37.2 C)  Resp: 16 16    General: NAD Cardiovascular: RRR Respiratory: CTAB anterior lung fields.  Discharge Instructions   Discharge Instructions    Diet - low sodium  heart healthy    Complete by:  As directed      Discharge instructions    Complete by:  As directed   Follow up with Dr Tat in 1-2 months. Follow up with [name], MD in 2 weeks.     Increase activity slowly    Complete by:  As directed           Current Discharge Medication List    START taking these medications   Details  aspirin 325 MG tablet Take 1 tablet (325 mg total) by mouth daily.    atorvastatin (LIPITOR) 20 MG tablet Take 1 tablet (20 mg total) by mouth daily at 6 PM. Qty: 30 tablet, Refills: 0    cefUROXime (CEFTIN) 250 MG tablet Take 1 tablet (250 mg total) by mouth 2 (two) times daily with a meal. Take for 5 days then stop. Qty: 10 tablet, Refills: 0    vitamin B-12 1000 MCG tablet Take 1 tablet (1,000 mcg total) by mouth daily. Take for 2 weeks. Qty: 14 tablet, Refills: 0      CONTINUE these medications which have NOT CHANGED   Details  acetaminophen (TYLENOL) 325 MG tablet Take 325 mg by mouth every 6 (six) hours as needed for mild pain (pain).     donepezil (ARICEPT) 10 MG tablet Take 1 tablet (10 mg total) by mouth at bedtime. Qty: 30 tablet, Refills: 11    levothyroxine (SYNTHROID, LEVOTHROID) 150 MCG tablet Take 150 mcg by mouth daily before breakfast.    Multiple Vitamin (MULTIVITAMIN) tablet Take 1 tablet by mouth daily.    nystatin cream (MYCOSTATIN) Apply 1 application topically 2 (two) times daily.    Probiotic Product (ACIDOPHILUS/GOAT MILK) CAPS Take 1 capsule by mouth daily.     senna (SENOKOT) 8.6 MG TABS tablet Take 1 tablet (8.6 mg total) by mouth daily as needed for mild constipation. Qty: 30 each, Refills: 0    sertraline (ZOLOFT) 25 MG tablet Take 25 mg by mouth daily.    Zinc Oxide 10 % OINT Apply 1 application topically 3 (three) times daily.      STOP taking these medications     clopidogrel (PLAVIX) 75 MG tablet        Allergies  Allergen Reactions  . Citalopram Hydrobromide     Shaky inside  . Sulfa  Antibiotics     rash  . Ciprofloxacin Rash   Follow-up Information    Follow up with [name], MD. Schedule an appointment as soon as possible for a visit in 1 week.   Specialty:  Family Medicine   Why:  f/u in 1-2 weeks.   Contact information:   [place] [address] [phone]       Follow up with TAT, REBECCA, DO. Schedule an appointment as soon as possible for a visit in 1 month.   Specialty:  Neurology   Contact information:   [place]  [place]  [address] [phone]        The results of significant diagnostics from this hospitalization (including imaging, microbiology, ancillary and laboratory) are listed below for reference.    Significant Diagnostic Studies: Dg Chest 2 View  05/17/2016  CLINICAL DATA:  Altered mental status EXAM: CHEST  2 VIEW COMPARISON:  September 23, 2014 FINDINGS: There is mild left base atelectasis. There is no edema or consolidation. Heart size and pulmonary vascularity are normal. No adenopathy. There is atherosclerotic calcification in the aortic arch region. There are multiple old healed rib fractures on the right. There is superior migration of both humeral heads with degenerative change in both shoulders. IMPRESSION: Mild left base atelectasis. No edema or consolidation. Aortic atherosclerosis noted. Chronic rotator cuff tears in both shoulders, characterized by superior migration of each humeral head. Multiple old healed rib fractures on the right. Electronically Signed   By: Lowella Grip III M.D.   On: 05/17/2016 15:44   Mr Brain Wo Contrast  05/17/2016  CLINICAL DATA:  Initial evaluation for acute right-sided weakness. EXAM: MRI HEAD WITHOUT CONTRAST MRA HEAD WITHOUT CONTRAST TECHNIQUE: Multiplanar, multiecho pulse sequences of the brain and surrounding structures were obtained without intravenous contrast. Angiographic images of the head were obtained using MRA technique without  contrast. COMPARISON:  Prior CT from earlier the same day. FINDINGS: MRI HEAD FINDINGS Diffuse prominence of the CSF containing spaces is compatible with generalized age-related cerebral atrophy. Scattered patchy confluent T2/FLAIR hyperintensity within the periventricular and deep white matter both cerebral hemispheres most consistent with chronic small vessel ischemic disease, moderate in nature. Remote lacunar infarct within the left caudate head. Scattered foci susceptibility artifact present within the anterior left frontal lobe, consistent with chronic hemorrhagic blood products, which may related to remote ischemia or possibly trauma. These are stable relative to prior MRI from 2012. No other remote infarct. 11 mm focus of linear restricted diffusion within the left lentiform nucleus, consistent with an acute ischemic infarct (series 3, image 20). No associated mass effect or hemorrhage. No other acute infarction. Major intracranial vascular flow voids are maintained. Gray-white matter differentiation otherwise preserved. No mass lesion, midline shift, or mass effect. Mild ventricular prominence related to global parenchymal volume loss without hydrocephalus. No extra-axial fluid collection. Major dural sinuses are grossly patent. Craniocervical junction within normal limits. Degenerative spondylolysis noted at the C3-4 level with mild canal stenosis. Pituitary gland within normal limits. No acute abnormality about the globes and orbits. Patient is status post lens extraction bilaterally. Scattered mucosal thickening within the ethmoidal air cells and maxillary sinuses. No air-fluid level to suggest active sinus infection. Trace opacity left mastoid air cells. Inner ear structures grossly normal. Bone marrow signal intensity within normal limits. No scalp soft tissue abnormality. MRA HEAD FINDINGS ANTERIOR CIRCULATION: Study fairly degraded by motion artifact. Visualized distal cervical segments of the  internal carotid arteries are patent with antegrade flow. Focal apparent stenoses at the bilateral petrous-cervical junction of the internal reason is favored to be artifactual in nature given the symmetric nature of these findings. Additionally, there is motion artifact through this portion of the exam. Petrous segments otherwise widely patent. Scattered atheromatous irregularity within the cavernous/supraclinoid ICAs bilaterally without flow-limiting stenosis. A1 segments demonstrate multi focal irregularity but are widely patent without significant stenosis. Anterior communicating artery within normal limits. Anterior cerebral arteries well opacified. Focal short-segment moderate stenosis within the mid left M1 segment (series 453, image 8). M1 segment patent distally. Left MCA bifurcation normal. Proximal left M2 stenosis and attenuation. Left MCA branches are well opacified distally but demonstrate multifocal atheromatous irregularity. Right M1 segment patent to its distal aspect without stenosis or occlusion. There is fairly severe stenosis at the right MCA bifurcation extending into the proximal right M2 branches. This appears to be nonocclusive with flow related opacification  within the right MCA branches distally. Right MCA branches also demonstrate multifocal atheromatous irregularity. POSTERIOR CIRCULATION: Vertebral arteries largely code dominant and patent to the vertebrobasilar junction. Posterior inferior cerebral arteries patent proximally. Basilar artery patent without stenosis or occlusion. Basilar tip is ectatic without focal aneurysm. Superior cerebellar arteries patent bilaterally, although there is a probable moderate to severe stenosis at the origin of the left SCA. Both of the posterior cerebral arteries arise knee basilar artery. Focal mild stenoses at the proximal P1 segments. PCAs demonstrate atheromatous irregularity distally, left greater than right, but are opacified to their distal  aspects. No aneurysm or vascular malformation. IMPRESSION: MRI HEAD IMPRESSION: 1. 11 mm acute ischemic nonhemorrhagic lacunar type infarction within the left lentiform nucleus. No associated mass effect. 2. Remote lacunar infarct within the left caudate head. 3. Generalized age-related cerebral atrophy with moderate chronic small vessel ischemic disease. MRA HEAD IMPRESSION: 1. No proximal or large vessel occlusion within the intracranial circulation. 2. Focal short segment moderate mid left M1 segment stenosis. 3. Moderate to severe nonocclusive stenoses with attenuation involving the proximal M2 segments bilaterally, right worse than left. Patent flow within the MCA branches distally. 4. Additional multifocal atheromatous disease involving the posterior circulation as above without high-grade flow-limiting stenosis. 5. Distal small vessel regularity involving the MCA and PCA branches bilaterally. Electronically Signed   By: Jeannine Boga M.D.   On: 05/17/2016 22:56   Mr Jodene Nam Head/brain Wo Cm  05/17/2016  CLINICAL DATA:  Initial evaluation for acute right-sided weakness. EXAM: MRI HEAD WITHOUT CONTRAST MRA HEAD WITHOUT CONTRAST TECHNIQUE: Multiplanar, multiecho pulse sequences of the brain and surrounding structures were obtained without intravenous contrast. Angiographic images of the head were obtained using MRA technique without contrast. COMPARISON:  Prior CT from earlier the same day. FINDINGS: MRI HEAD FINDINGS Diffuse prominence of the CSF containing spaces is compatible with generalized age-related cerebral atrophy. Scattered patchy confluent T2/FLAIR hyperintensity within the periventricular and deep white matter both cerebral hemispheres most consistent with chronic small vessel ischemic disease, moderate in nature. Remote lacunar infarct within the left caudate head. Scattered foci susceptibility artifact present within the anterior left frontal lobe, consistent with chronic hemorrhagic blood  products, which may related to remote ischemia or possibly trauma. These are stable relative to prior MRI from 2012. No other remote infarct. 11 mm focus of linear restricted diffusion within the left lentiform nucleus, consistent with an acute ischemic infarct (series 3, image 20). No associated mass effect or hemorrhage. No other acute infarction. Major intracranial vascular flow voids are maintained. Gray-white matter differentiation otherwise preserved. No mass lesion, midline shift, or mass effect. Mild ventricular prominence related to global parenchymal volume loss without hydrocephalus. No extra-axial fluid collection. Major dural sinuses are grossly patent. Craniocervical junction within normal limits. Degenerative spondylolysis noted at the C3-4 level with mild canal stenosis. Pituitary gland within normal limits. No acute abnormality about the globes and orbits. Patient is status post lens extraction bilaterally. Scattered mucosal thickening within the ethmoidal air cells and maxillary sinuses. No air-fluid level to suggest active sinus infection. Trace opacity left mastoid air cells. Inner ear structures grossly normal. Bone marrow signal intensity within normal limits. No scalp soft tissue abnormality. MRA HEAD FINDINGS ANTERIOR CIRCULATION: Study fairly degraded by motion artifact. Visualized distal cervical segments of the internal carotid arteries are patent with antegrade flow. Focal apparent stenoses at the bilateral petrous-cervical junction of the internal reason is favored to be artifactual in nature given the symmetric nature of these findings.  Additionally, there is motion artifact through this portion of the exam. Petrous segments otherwise widely patent. Scattered atheromatous irregularity within the cavernous/supraclinoid ICAs bilaterally without flow-limiting stenosis. A1 segments demonstrate multi focal irregularity but are widely patent without significant stenosis. Anterior  communicating artery within normal limits. Anterior cerebral arteries well opacified. Focal short-segment moderate stenosis within the mid left M1 segment (series 453, image 8). M1 segment patent distally. Left MCA bifurcation normal. Proximal left M2 stenosis and attenuation. Left MCA branches are well opacified distally but demonstrate multifocal atheromatous irregularity. Right M1 segment patent to its distal aspect without stenosis or occlusion. There is fairly severe stenosis at the right MCA bifurcation extending into the proximal right M2 branches. This appears to be nonocclusive with flow related opacification within the right MCA branches distally. Right MCA branches also demonstrate multifocal atheromatous irregularity. POSTERIOR CIRCULATION: Vertebral arteries largely code dominant and patent to the vertebrobasilar junction. Posterior inferior cerebral arteries patent proximally. Basilar artery patent without stenosis or occlusion. Basilar tip is ectatic without focal aneurysm. Superior cerebellar arteries patent bilaterally, although there is a probable moderate to severe stenosis at the origin of the left SCA. Both of the posterior cerebral arteries arise knee basilar artery. Focal mild stenoses at the proximal P1 segments. PCAs demonstrate atheromatous irregularity distally, left greater than right, but are opacified to their distal aspects. No aneurysm or vascular malformation. IMPRESSION: MRI HEAD IMPRESSION: 1. 11 mm acute ischemic nonhemorrhagic lacunar type infarction within the left lentiform nucleus. No associated mass effect. 2. Remote lacunar infarct within the left caudate head. 3. Generalized age-related cerebral atrophy with moderate chronic small vessel ischemic disease. MRA HEAD IMPRESSION: 1. No proximal or large vessel occlusion within the intracranial circulation. 2. Focal short segment moderate mid left M1 segment stenosis. 3. Moderate to severe nonocclusive stenoses with attenuation  involving the proximal M2 segments bilaterally, right worse than left. Patent flow within the MCA branches distally. 4. Additional multifocal atheromatous disease involving the posterior circulation as above without high-grade flow-limiting stenosis. 5. Distal small vessel regularity involving the MCA and PCA branches bilaterally. Electronically Signed   By: Jeannine Boga M.D.   On: 05/17/2016 22:56   Ct Head Code Stroke W/o Cm  05/17/2016  CLINICAL DATA:  Code stroke for slurred speech and facial droop. EXAM: CT HEAD WITHOUT CONTRAST TECHNIQUE: Contiguous axial images were obtained from the base of the skull through the vertex without intravenous contrast. COMPARISON:  01/29/2015 FINDINGS: Brain: No evidence of acute infarction, hemorrhage, hydrocephalus, or mass lesion/mass effect. Stable patchy microvascular ischemic gliosis in the bilateral cerebral white matter. Stable patchy low-density in the bilateral thalamus and left caudate. Accounting for chronic changes ASPCETS is 10. Mild to moderate for age generalized atrophy. Vascular: No hyperdense vessel. Skull: Negative for fracture or focal lesion. Sinuses/Orbits: Bilateral cataract resection.  No acute finding. Other: These results were called by telephone at the time of interpretation on 05/17/2016 at 3:04 pm to Dr. Cristobal Goldmann, who verbally acknowledged these results. IMPRESSION: 1. No acute finding or change from 2016. 2. Moderate chronic microvascular disease. Electronically Signed   By: Monte Fantasia M.D.   On: 05/17/2016 15:05    Microbiology: Recent Results (from the past 240 hour(s))  Urine culture     Status: Abnormal   Collection Time: 05/17/16  3:14 PM  Result Value Ref Range Status   Specimen Description URINE, RANDOM  Final   Special Requests NONE  Final   Culture MULTIPLE SPECIES PRESENT, SUGGEST RECOLLECTION (A)  Final  Report Status 05/19/2016 FINAL  Final  Blood culture (routine x 2)     Status: None (Preliminary result)    Collection Time: 05/17/16  5:00 PM  Result Value Ref Range Status   Specimen Description BLOOD RIGHT ANTECUBITAL  Final   Special Requests BOTTLES DRAWN AEROBIC AND ANAEROBIC 10CC  Final   Culture NO GROWTH 2 DAYS  Final   Report Status PENDING  Incomplete  Blood culture (routine x 2)     Status: None (Preliminary result)   Collection Time: 05/17/16  5:10 PM  Result Value Ref Range Status   Specimen Description BLOOD RIGHT ARM  Final   Special Requests BOTTLES DRAWN AEROBIC AND ANAEROBIC 10CC  Final   Culture NO GROWTH < 24 HOURS  Final   Report Status PENDING  Incomplete     Labs: Basic Metabolic Panel:  Recent Labs Lab 05/17/16 1455 05/17/16 1500 05/18/16 0521 05/19/16 0447  NA 135 136 136 139  K 4.5 4.6 3.6 3.6  CL 101 98* 107 108  CO2 25  --  24 24  GLUCOSE 132* 129* 99 97  BUN 9 13 7  <5*  CREATININE 0.82 0.80 0.52 0.51  CALCIUM 8.8*  --  8.0* 8.2*   Liver Function Tests:  Recent Labs Lab 05/17/16 1455  AST 25  ALT 15  ALKPHOS 80  BILITOT 0.2*  PROT 6.6  ALBUMIN 3.3*   No results for input(s): LIPASE, AMYLASE in the last 168 hours. No results for input(s): AMMONIA in the last 168 hours. CBC:  Recent Labs Lab 05/17/16 1455 05/17/16 1500 05/18/16 0521 05/19/16 0447  WBC 20.7*  --  8.7 7.5  NEUTROABS 18.1*  --   --   --   HGB 11.6* 12.9 9.6* 9.5*  HCT 36.6 38.0 30.8* 29.8*  MCV 88.4  --  90.1 91.1  PLT 362  --  271 278   Cardiac Enzymes:  Recent Labs Lab 05/17/16 2309  TROPONINI <0.03   BNP: BNP (last 3 results) No results for input(s): BNP in the last 8760 hours.  ProBNP (last 3 results) No results for input(s): PROBNP in the last 8760 hours.  CBG:  Recent Labs Lab 05/17/16 1525  GLUCAP 122*       Signed:  Kambrey Hagger MD.  [facility] 05/19/2016, 1:56 PM

## 2016-05-19 NOTE — Progress Notes (Signed)
Vit B12 injection was administered on left deltoid.  At 0515 noted redness, swelling, and tenderness on left lateral upper arm.

## 2016-05-19 NOTE — Progress Notes (Signed)
  Echocardiogram 2D Echocardiogram has been performed.  [name] 05/19/2016, 9:42 AM

## 2016-05-19 NOTE — Progress Notes (Signed)
Pt d/c to snf by private transportation. Assessment stable. Prescriptions given. All questions answered

## 2016-05-19 NOTE — Clinical Social Work Note (Signed)
Per MD patient ready to DC back to [facility]. RN, patient/family, and facility notified of patient's DC. RN given number for report. DC packet on patient's chart. Family states they have arranged transport. CSW signing off at this time.   Liz Beach MSW, [place], [place], JI:[phone]

## 2016-05-22 LAB — CULTURE, BLOOD (ROUTINE X 2)
CULTURE: NO GROWTH
Culture: NO GROWTH

## 2016-05-25 DIAGNOSIS — Z466 Encounter for fitting and adjustment of urinary device: Secondary | ICD-10-CM | POA: Diagnosis not present

## 2016-05-25 DIAGNOSIS — F0280 Dementia in other diseases classified elsewhere without behavioral disturbance: Secondary | ICD-10-CM | POA: Diagnosis not present

## 2016-05-25 DIAGNOSIS — Z87440 Personal history of urinary (tract) infections: Secondary | ICD-10-CM | POA: Diagnosis not present

## 2016-05-25 DIAGNOSIS — I10 Essential (primary) hypertension: Secondary | ICD-10-CM | POA: Diagnosis not present

## 2016-05-25 DIAGNOSIS — R339 Retention of urine, unspecified: Secondary | ICD-10-CM | POA: Diagnosis not present

## 2016-05-25 DIAGNOSIS — G3183 Dementia with Lewy bodies: Secondary | ICD-10-CM | POA: Diagnosis not present

## 2016-05-29 DIAGNOSIS — F320 Major depressive disorder, single episode, mild: Secondary | ICD-10-CM | POA: Diagnosis not present

## 2016-05-29 DIAGNOSIS — G20 Parkinson's disease: Secondary | ICD-10-CM | POA: Diagnosis not present

## 2016-05-29 DIAGNOSIS — F0280 Dementia in other diseases classified elsewhere without behavioral disturbance: Secondary | ICD-10-CM | POA: Diagnosis not present

## 2016-06-04 DIAGNOSIS — Z466 Encounter for fitting and adjustment of urinary device: Secondary | ICD-10-CM | POA: Diagnosis not present

## 2016-06-04 DIAGNOSIS — Z87440 Personal history of urinary (tract) infections: Secondary | ICD-10-CM | POA: Diagnosis not present

## 2016-06-04 DIAGNOSIS — G3183 Dementia with Lewy bodies: Secondary | ICD-10-CM | POA: Diagnosis not present

## 2016-06-04 DIAGNOSIS — F0280 Dementia in other diseases classified elsewhere without behavioral disturbance: Secondary | ICD-10-CM | POA: Diagnosis not present

## 2016-06-04 DIAGNOSIS — R339 Retention of urine, unspecified: Secondary | ICD-10-CM | POA: Diagnosis not present

## 2016-06-04 DIAGNOSIS — I10 Essential (primary) hypertension: Secondary | ICD-10-CM | POA: Diagnosis not present

## 2016-06-13 DIAGNOSIS — G3183 Dementia with Lewy bodies: Secondary | ICD-10-CM | POA: Diagnosis not present

## 2016-06-13 DIAGNOSIS — F0280 Dementia in other diseases classified elsewhere without behavioral disturbance: Secondary | ICD-10-CM | POA: Diagnosis not present

## 2016-06-13 DIAGNOSIS — K5901 Slow transit constipation: Secondary | ICD-10-CM | POA: Diagnosis not present

## 2016-06-13 DIAGNOSIS — F320 Major depressive disorder, single episode, mild: Secondary | ICD-10-CM | POA: Diagnosis not present

## 2016-06-13 DIAGNOSIS — E039 Hypothyroidism, unspecified: Secondary | ICD-10-CM | POA: Diagnosis not present

## 2016-06-13 DIAGNOSIS — Z87440 Personal history of urinary (tract) infections: Secondary | ICD-10-CM | POA: Diagnosis not present

## 2016-06-13 DIAGNOSIS — I69392 Facial weakness following cerebral infarction: Secondary | ICD-10-CM | POA: Diagnosis not present

## 2016-06-13 DIAGNOSIS — G459 Transient cerebral ischemic attack, unspecified: Secondary | ICD-10-CM | POA: Diagnosis not present

## 2016-06-13 DIAGNOSIS — G309 Alzheimer's disease, unspecified: Secondary | ICD-10-CM | POA: Diagnosis not present

## 2016-06-13 DIAGNOSIS — I69351 Hemiplegia and hemiparesis following cerebral infarction affecting right dominant side: Secondary | ICD-10-CM | POA: Diagnosis not present

## 2016-06-13 DIAGNOSIS — R339 Retention of urine, unspecified: Secondary | ICD-10-CM | POA: Diagnosis not present

## 2016-06-13 DIAGNOSIS — Z466 Encounter for fitting and adjustment of urinary device: Secondary | ICD-10-CM | POA: Diagnosis not present

## 2016-06-13 DIAGNOSIS — N390 Urinary tract infection, site not specified: Secondary | ICD-10-CM | POA: Diagnosis not present

## 2016-06-13 DIAGNOSIS — B372 Candidiasis of skin and nail: Secondary | ICD-10-CM | POA: Diagnosis not present

## 2016-06-17 DIAGNOSIS — M6281 Muscle weakness (generalized): Secondary | ICD-10-CM | POA: Diagnosis not present

## 2016-06-17 DIAGNOSIS — I10 Essential (primary) hypertension: Secondary | ICD-10-CM | POA: Diagnosis not present

## 2016-06-17 DIAGNOSIS — R269 Unspecified abnormalities of gait and mobility: Secondary | ICD-10-CM | POA: Diagnosis not present

## 2016-06-17 DIAGNOSIS — G459 Transient cerebral ischemic attack, unspecified: Secondary | ICD-10-CM | POA: Diagnosis not present

## 2016-06-17 DIAGNOSIS — R2689 Other abnormalities of gait and mobility: Secondary | ICD-10-CM | POA: Diagnosis not present

## 2016-06-26 DIAGNOSIS — F320 Major depressive disorder, single episode, mild: Secondary | ICD-10-CM | POA: Diagnosis not present

## 2016-06-26 DIAGNOSIS — G20 Parkinson's disease: Secondary | ICD-10-CM | POA: Diagnosis not present

## 2016-06-26 DIAGNOSIS — F0280 Dementia in other diseases classified elsewhere without behavioral disturbance: Secondary | ICD-10-CM | POA: Diagnosis not present

## 2016-07-11 DIAGNOSIS — B372 Candidiasis of skin and nail: Secondary | ICD-10-CM | POA: Diagnosis not present

## 2016-07-11 DIAGNOSIS — G459 Transient cerebral ischemic attack, unspecified: Secondary | ICD-10-CM | POA: Diagnosis not present

## 2016-07-11 DIAGNOSIS — K5901 Slow transit constipation: Secondary | ICD-10-CM | POA: Diagnosis not present

## 2016-07-11 DIAGNOSIS — F0280 Dementia in other diseases classified elsewhere without behavioral disturbance: Secondary | ICD-10-CM | POA: Diagnosis not present

## 2016-07-11 DIAGNOSIS — Z87440 Personal history of urinary (tract) infections: Secondary | ICD-10-CM | POA: Diagnosis not present

## 2016-07-11 DIAGNOSIS — E039 Hypothyroidism, unspecified: Secondary | ICD-10-CM | POA: Diagnosis not present

## 2016-07-11 DIAGNOSIS — R339 Retention of urine, unspecified: Secondary | ICD-10-CM | POA: Diagnosis not present

## 2016-07-12 DIAGNOSIS — Z466 Encounter for fitting and adjustment of urinary device: Secondary | ICD-10-CM | POA: Diagnosis not present

## 2016-07-12 DIAGNOSIS — I69392 Facial weakness following cerebral infarction: Secondary | ICD-10-CM | POA: Diagnosis not present

## 2016-07-12 DIAGNOSIS — G309 Alzheimer's disease, unspecified: Secondary | ICD-10-CM | POA: Diagnosis not present

## 2016-07-12 DIAGNOSIS — I69351 Hemiplegia and hemiparesis following cerebral infarction affecting right dominant side: Secondary | ICD-10-CM | POA: Diagnosis not present

## 2016-07-12 DIAGNOSIS — R339 Retention of urine, unspecified: Secondary | ICD-10-CM | POA: Diagnosis not present

## 2016-07-12 DIAGNOSIS — G3183 Dementia with Lewy bodies: Secondary | ICD-10-CM | POA: Diagnosis not present

## 2016-07-18 DIAGNOSIS — I10 Essential (primary) hypertension: Secondary | ICD-10-CM | POA: Diagnosis not present

## 2016-07-18 DIAGNOSIS — R269 Unspecified abnormalities of gait and mobility: Secondary | ICD-10-CM | POA: Diagnosis not present

## 2016-07-18 DIAGNOSIS — R2689 Other abnormalities of gait and mobility: Secondary | ICD-10-CM | POA: Diagnosis not present

## 2016-07-18 DIAGNOSIS — M6281 Muscle weakness (generalized): Secondary | ICD-10-CM | POA: Diagnosis not present

## 2016-07-18 DIAGNOSIS — G459 Transient cerebral ischemic attack, unspecified: Secondary | ICD-10-CM | POA: Diagnosis not present

## 2016-07-26 DIAGNOSIS — N390 Urinary tract infection, site not specified: Secondary | ICD-10-CM | POA: Diagnosis not present

## 2016-07-31 DIAGNOSIS — F0280 Dementia in other diseases classified elsewhere without behavioral disturbance: Secondary | ICD-10-CM | POA: Diagnosis not present

## 2016-07-31 DIAGNOSIS — G20 Parkinson's disease: Secondary | ICD-10-CM | POA: Diagnosis not present

## 2016-07-31 DIAGNOSIS — F320 Major depressive disorder, single episode, mild: Secondary | ICD-10-CM | POA: Diagnosis not present

## 2016-08-01 DIAGNOSIS — N390 Urinary tract infection, site not specified: Secondary | ICD-10-CM | POA: Diagnosis not present

## 2016-08-02 DIAGNOSIS — Z466 Encounter for fitting and adjustment of urinary device: Secondary | ICD-10-CM | POA: Diagnosis not present

## 2016-08-02 DIAGNOSIS — I69351 Hemiplegia and hemiparesis following cerebral infarction affecting right dominant side: Secondary | ICD-10-CM | POA: Diagnosis not present

## 2016-08-02 DIAGNOSIS — I69392 Facial weakness following cerebral infarction: Secondary | ICD-10-CM | POA: Diagnosis not present

## 2016-08-02 DIAGNOSIS — R339 Retention of urine, unspecified: Secondary | ICD-10-CM | POA: Diagnosis not present

## 2016-08-02 DIAGNOSIS — G309 Alzheimer's disease, unspecified: Secondary | ICD-10-CM | POA: Diagnosis not present

## 2016-08-02 DIAGNOSIS — G3183 Dementia with Lewy bodies: Secondary | ICD-10-CM | POA: Diagnosis not present

## 2016-08-03 DIAGNOSIS — G3183 Dementia with Lewy bodies: Secondary | ICD-10-CM | POA: Diagnosis not present

## 2016-08-03 DIAGNOSIS — R339 Retention of urine, unspecified: Secondary | ICD-10-CM | POA: Diagnosis not present

## 2016-08-03 DIAGNOSIS — I69351 Hemiplegia and hemiparesis following cerebral infarction affecting right dominant side: Secondary | ICD-10-CM | POA: Diagnosis not present

## 2016-08-03 DIAGNOSIS — I69392 Facial weakness following cerebral infarction: Secondary | ICD-10-CM | POA: Diagnosis not present

## 2016-08-03 DIAGNOSIS — G309 Alzheimer's disease, unspecified: Secondary | ICD-10-CM | POA: Diagnosis not present

## 2016-08-03 DIAGNOSIS — Z466 Encounter for fitting and adjustment of urinary device: Secondary | ICD-10-CM | POA: Diagnosis not present

## 2016-08-15 DIAGNOSIS — Z87440 Personal history of urinary (tract) infections: Secondary | ICD-10-CM | POA: Diagnosis not present

## 2016-08-15 DIAGNOSIS — F0280 Dementia in other diseases classified elsewhere without behavioral disturbance: Secondary | ICD-10-CM | POA: Diagnosis not present

## 2016-08-15 DIAGNOSIS — B372 Candidiasis of skin and nail: Secondary | ICD-10-CM | POA: Diagnosis not present

## 2016-08-15 DIAGNOSIS — K5901 Slow transit constipation: Secondary | ICD-10-CM | POA: Diagnosis not present

## 2016-08-15 DIAGNOSIS — R339 Retention of urine, unspecified: Secondary | ICD-10-CM | POA: Diagnosis not present

## 2016-08-15 DIAGNOSIS — G459 Transient cerebral ischemic attack, unspecified: Secondary | ICD-10-CM | POA: Diagnosis not present

## 2016-08-15 DIAGNOSIS — E039 Hypothyroidism, unspecified: Secondary | ICD-10-CM | POA: Diagnosis not present

## 2016-08-18 DIAGNOSIS — G459 Transient cerebral ischemic attack, unspecified: Secondary | ICD-10-CM | POA: Diagnosis not present

## 2016-08-18 DIAGNOSIS — R2689 Other abnormalities of gait and mobility: Secondary | ICD-10-CM | POA: Diagnosis not present

## 2016-08-18 DIAGNOSIS — M6281 Muscle weakness (generalized): Secondary | ICD-10-CM | POA: Diagnosis not present

## 2016-08-18 DIAGNOSIS — I10 Essential (primary) hypertension: Secondary | ICD-10-CM | POA: Diagnosis not present

## 2016-08-18 DIAGNOSIS — R269 Unspecified abnormalities of gait and mobility: Secondary | ICD-10-CM | POA: Diagnosis not present

## 2016-08-23 DIAGNOSIS — G3183 Dementia with Lewy bodies: Secondary | ICD-10-CM | POA: Diagnosis not present

## 2016-08-23 DIAGNOSIS — Z466 Encounter for fitting and adjustment of urinary device: Secondary | ICD-10-CM | POA: Diagnosis not present

## 2016-08-23 DIAGNOSIS — R339 Retention of urine, unspecified: Secondary | ICD-10-CM | POA: Diagnosis not present

## 2016-08-23 DIAGNOSIS — F0280 Dementia in other diseases classified elsewhere without behavioral disturbance: Secondary | ICD-10-CM | POA: Diagnosis not present

## 2016-08-23 DIAGNOSIS — G309 Alzheimer's disease, unspecified: Secondary | ICD-10-CM | POA: Diagnosis not present

## 2016-08-23 DIAGNOSIS — N390 Urinary tract infection, site not specified: Secondary | ICD-10-CM | POA: Diagnosis not present

## 2016-09-03 DIAGNOSIS — G309 Alzheimer's disease, unspecified: Secondary | ICD-10-CM | POA: Diagnosis not present

## 2016-09-03 DIAGNOSIS — F0280 Dementia in other diseases classified elsewhere without behavioral disturbance: Secondary | ICD-10-CM | POA: Diagnosis not present

## 2016-09-03 DIAGNOSIS — N390 Urinary tract infection, site not specified: Secondary | ICD-10-CM | POA: Diagnosis not present

## 2016-09-03 DIAGNOSIS — Z466 Encounter for fitting and adjustment of urinary device: Secondary | ICD-10-CM | POA: Diagnosis not present

## 2016-09-03 DIAGNOSIS — G3183 Dementia with Lewy bodies: Secondary | ICD-10-CM | POA: Diagnosis not present

## 2016-09-03 DIAGNOSIS — R339 Retention of urine, unspecified: Secondary | ICD-10-CM | POA: Diagnosis not present

## 2016-09-11 ENCOUNTER — Other Ambulatory Visit: Payer: Self-pay | Admitting: Family Medicine

## 2016-09-12 DIAGNOSIS — I10 Essential (primary) hypertension: Secondary | ICD-10-CM | POA: Diagnosis not present

## 2016-09-12 DIAGNOSIS — K5901 Slow transit constipation: Secondary | ICD-10-CM | POA: Diagnosis not present

## 2016-09-12 DIAGNOSIS — E039 Hypothyroidism, unspecified: Secondary | ICD-10-CM | POA: Diagnosis not present

## 2016-09-12 DIAGNOSIS — B372 Candidiasis of skin and nail: Secondary | ICD-10-CM | POA: Diagnosis not present

## 2016-09-12 DIAGNOSIS — Z87440 Personal history of urinary (tract) infections: Secondary | ICD-10-CM | POA: Diagnosis not present

## 2016-09-12 DIAGNOSIS — R339 Retention of urine, unspecified: Secondary | ICD-10-CM | POA: Diagnosis not present

## 2016-09-12 DIAGNOSIS — F0280 Dementia in other diseases classified elsewhere without behavioral disturbance: Secondary | ICD-10-CM | POA: Diagnosis not present

## 2016-09-12 DIAGNOSIS — G459 Transient cerebral ischemic attack, unspecified: Secondary | ICD-10-CM | POA: Diagnosis not present

## 2016-09-14 DIAGNOSIS — G459 Transient cerebral ischemic attack, unspecified: Secondary | ICD-10-CM | POA: Diagnosis not present

## 2016-09-14 DIAGNOSIS — R339 Retention of urine, unspecified: Secondary | ICD-10-CM | POA: Diagnosis not present

## 2016-09-14 DIAGNOSIS — E039 Hypothyroidism, unspecified: Secondary | ICD-10-CM | POA: Diagnosis not present

## 2016-09-14 DIAGNOSIS — K5901 Slow transit constipation: Secondary | ICD-10-CM | POA: Diagnosis not present

## 2016-09-14 DIAGNOSIS — Z87440 Personal history of urinary (tract) infections: Secondary | ICD-10-CM | POA: Diagnosis not present

## 2016-09-14 DIAGNOSIS — F0280 Dementia in other diseases classified elsewhere without behavioral disturbance: Secondary | ICD-10-CM | POA: Diagnosis not present

## 2016-09-14 DIAGNOSIS — B372 Candidiasis of skin and nail: Secondary | ICD-10-CM | POA: Diagnosis not present

## 2016-09-17 DIAGNOSIS — Z466 Encounter for fitting and adjustment of urinary device: Secondary | ICD-10-CM | POA: Diagnosis not present

## 2016-09-17 DIAGNOSIS — N390 Urinary tract infection, site not specified: Secondary | ICD-10-CM | POA: Diagnosis not present

## 2016-09-17 DIAGNOSIS — R339 Retention of urine, unspecified: Secondary | ICD-10-CM | POA: Diagnosis not present

## 2016-09-17 DIAGNOSIS — R269 Unspecified abnormalities of gait and mobility: Secondary | ICD-10-CM | POA: Diagnosis not present

## 2016-09-17 DIAGNOSIS — F0280 Dementia in other diseases classified elsewhere without behavioral disturbance: Secondary | ICD-10-CM | POA: Diagnosis not present

## 2016-09-17 DIAGNOSIS — G459 Transient cerebral ischemic attack, unspecified: Secondary | ICD-10-CM | POA: Diagnosis not present

## 2016-09-17 DIAGNOSIS — M6281 Muscle weakness (generalized): Secondary | ICD-10-CM | POA: Diagnosis not present

## 2016-09-17 DIAGNOSIS — G3183 Dementia with Lewy bodies: Secondary | ICD-10-CM | POA: Diagnosis not present

## 2016-09-17 DIAGNOSIS — G309 Alzheimer's disease, unspecified: Secondary | ICD-10-CM | POA: Diagnosis not present

## 2016-09-17 DIAGNOSIS — I10 Essential (primary) hypertension: Secondary | ICD-10-CM | POA: Diagnosis not present

## 2016-09-17 DIAGNOSIS — R2689 Other abnormalities of gait and mobility: Secondary | ICD-10-CM | POA: Diagnosis not present

## 2016-09-18 DIAGNOSIS — G3183 Dementia with Lewy bodies: Secondary | ICD-10-CM | POA: Diagnosis not present

## 2016-09-18 DIAGNOSIS — Z466 Encounter for fitting and adjustment of urinary device: Secondary | ICD-10-CM | POA: Diagnosis not present

## 2016-09-18 DIAGNOSIS — R339 Retention of urine, unspecified: Secondary | ICD-10-CM | POA: Diagnosis not present

## 2016-09-18 DIAGNOSIS — G309 Alzheimer's disease, unspecified: Secondary | ICD-10-CM | POA: Diagnosis not present

## 2016-09-18 DIAGNOSIS — F0280 Dementia in other diseases classified elsewhere without behavioral disturbance: Secondary | ICD-10-CM | POA: Diagnosis not present

## 2016-09-18 DIAGNOSIS — N390 Urinary tract infection, site not specified: Secondary | ICD-10-CM | POA: Diagnosis not present

## 2016-09-25 DIAGNOSIS — G20 Parkinson's disease: Secondary | ICD-10-CM | POA: Diagnosis not present

## 2016-09-25 DIAGNOSIS — F0280 Dementia in other diseases classified elsewhere without behavioral disturbance: Secondary | ICD-10-CM | POA: Diagnosis not present

## 2016-09-25 DIAGNOSIS — F320 Major depressive disorder, single episode, mild: Secondary | ICD-10-CM | POA: Diagnosis not present

## 2016-09-27 DIAGNOSIS — R339 Retention of urine, unspecified: Secondary | ICD-10-CM | POA: Diagnosis not present

## 2016-09-27 DIAGNOSIS — G3183 Dementia with Lewy bodies: Secondary | ICD-10-CM | POA: Diagnosis not present

## 2016-09-27 DIAGNOSIS — Z466 Encounter for fitting and adjustment of urinary device: Secondary | ICD-10-CM | POA: Diagnosis not present

## 2016-09-27 DIAGNOSIS — G309 Alzheimer's disease, unspecified: Secondary | ICD-10-CM | POA: Diagnosis not present

## 2016-09-27 DIAGNOSIS — N390 Urinary tract infection, site not specified: Secondary | ICD-10-CM | POA: Diagnosis not present

## 2016-09-27 DIAGNOSIS — F0280 Dementia in other diseases classified elsewhere without behavioral disturbance: Secondary | ICD-10-CM | POA: Diagnosis not present

## 2016-10-03 DIAGNOSIS — N390 Urinary tract infection, site not specified: Secondary | ICD-10-CM | POA: Diagnosis not present

## 2016-10-03 DIAGNOSIS — G309 Alzheimer's disease, unspecified: Secondary | ICD-10-CM | POA: Diagnosis not present

## 2016-10-03 DIAGNOSIS — Z466 Encounter for fitting and adjustment of urinary device: Secondary | ICD-10-CM | POA: Diagnosis not present

## 2016-10-03 DIAGNOSIS — R339 Retention of urine, unspecified: Secondary | ICD-10-CM | POA: Diagnosis not present

## 2016-10-03 DIAGNOSIS — G3183 Dementia with Lewy bodies: Secondary | ICD-10-CM | POA: Diagnosis not present

## 2016-10-03 DIAGNOSIS — F0280 Dementia in other diseases classified elsewhere without behavioral disturbance: Secondary | ICD-10-CM | POA: Diagnosis not present

## 2016-10-05 DIAGNOSIS — N390 Urinary tract infection, site not specified: Secondary | ICD-10-CM | POA: Diagnosis not present

## 2016-10-05 DIAGNOSIS — Z466 Encounter for fitting and adjustment of urinary device: Secondary | ICD-10-CM | POA: Diagnosis not present

## 2016-10-05 DIAGNOSIS — F0280 Dementia in other diseases classified elsewhere without behavioral disturbance: Secondary | ICD-10-CM | POA: Diagnosis not present

## 2016-10-05 DIAGNOSIS — G309 Alzheimer's disease, unspecified: Secondary | ICD-10-CM | POA: Diagnosis not present

## 2016-10-05 DIAGNOSIS — G3183 Dementia with Lewy bodies: Secondary | ICD-10-CM | POA: Diagnosis not present

## 2016-10-05 DIAGNOSIS — R339 Retention of urine, unspecified: Secondary | ICD-10-CM | POA: Diagnosis not present

## 2016-10-06 DIAGNOSIS — N390 Urinary tract infection, site not specified: Secondary | ICD-10-CM | POA: Diagnosis not present

## 2016-10-06 DIAGNOSIS — R339 Retention of urine, unspecified: Secondary | ICD-10-CM | POA: Diagnosis not present

## 2016-10-06 DIAGNOSIS — Z466 Encounter for fitting and adjustment of urinary device: Secondary | ICD-10-CM | POA: Diagnosis not present

## 2016-10-06 DIAGNOSIS — G3183 Dementia with Lewy bodies: Secondary | ICD-10-CM | POA: Diagnosis not present

## 2016-10-06 DIAGNOSIS — F0280 Dementia in other diseases classified elsewhere without behavioral disturbance: Secondary | ICD-10-CM | POA: Diagnosis not present

## 2016-10-06 DIAGNOSIS — G309 Alzheimer's disease, unspecified: Secondary | ICD-10-CM | POA: Diagnosis not present

## 2016-10-07 DIAGNOSIS — G3183 Dementia with Lewy bodies: Secondary | ICD-10-CM | POA: Diagnosis not present

## 2016-10-07 DIAGNOSIS — N390 Urinary tract infection, site not specified: Secondary | ICD-10-CM | POA: Diagnosis not present

## 2016-10-07 DIAGNOSIS — G309 Alzheimer's disease, unspecified: Secondary | ICD-10-CM | POA: Diagnosis not present

## 2016-10-07 DIAGNOSIS — Z466 Encounter for fitting and adjustment of urinary device: Secondary | ICD-10-CM | POA: Diagnosis not present

## 2016-10-07 DIAGNOSIS — F0280 Dementia in other diseases classified elsewhere without behavioral disturbance: Secondary | ICD-10-CM | POA: Diagnosis not present

## 2016-10-07 DIAGNOSIS — R339 Retention of urine, unspecified: Secondary | ICD-10-CM | POA: Diagnosis not present

## 2016-10-10 ENCOUNTER — Telehealth: Payer: Self-pay

## 2016-10-10 DIAGNOSIS — E039 Hypothyroidism, unspecified: Secondary | ICD-10-CM | POA: Diagnosis not present

## 2016-10-10 DIAGNOSIS — B372 Candidiasis of skin and nail: Secondary | ICD-10-CM | POA: Diagnosis not present

## 2016-10-10 DIAGNOSIS — G459 Transient cerebral ischemic attack, unspecified: Secondary | ICD-10-CM | POA: Diagnosis not present

## 2016-10-10 DIAGNOSIS — K5901 Slow transit constipation: Secondary | ICD-10-CM | POA: Diagnosis not present

## 2016-10-10 DIAGNOSIS — F0280 Dementia in other diseases classified elsewhere without behavioral disturbance: Secondary | ICD-10-CM | POA: Diagnosis not present

## 2016-10-10 DIAGNOSIS — R339 Retention of urine, unspecified: Secondary | ICD-10-CM | POA: Diagnosis not present

## 2016-10-10 DIAGNOSIS — Z87440 Personal history of urinary (tract) infections: Secondary | ICD-10-CM | POA: Diagnosis not present

## 2016-10-10 NOTE — Telephone Encounter (Signed)
Spoke to dtr [name] regarding the need for fup care [name] is in a memory care Unit at [facility] and is currently being seen by a physician there. Thanked her for her time

## 2016-10-23 DIAGNOSIS — Z466 Encounter for fitting and adjustment of urinary device: Secondary | ICD-10-CM | POA: Diagnosis not present

## 2016-10-23 DIAGNOSIS — R339 Retention of urine, unspecified: Secondary | ICD-10-CM | POA: Diagnosis not present

## 2016-10-23 DIAGNOSIS — N390 Urinary tract infection, site not specified: Secondary | ICD-10-CM | POA: Diagnosis not present

## 2016-10-23 DIAGNOSIS — F0280 Dementia in other diseases classified elsewhere without behavioral disturbance: Secondary | ICD-10-CM | POA: Diagnosis not present

## 2016-10-23 DIAGNOSIS — G309 Alzheimer's disease, unspecified: Secondary | ICD-10-CM | POA: Diagnosis not present

## 2016-10-23 DIAGNOSIS — G3183 Dementia with Lewy bodies: Secondary | ICD-10-CM | POA: Diagnosis not present

## 2016-10-24 DIAGNOSIS — G20 Parkinson's disease: Secondary | ICD-10-CM | POA: Diagnosis not present

## 2016-10-24 DIAGNOSIS — F320 Major depressive disorder, single episode, mild: Secondary | ICD-10-CM | POA: Diagnosis not present

## 2016-10-24 DIAGNOSIS — F0280 Dementia in other diseases classified elsewhere without behavioral disturbance: Secondary | ICD-10-CM | POA: Diagnosis not present

## 2016-10-28 DIAGNOSIS — F0390 Unspecified dementia without behavioral disturbance: Secondary | ICD-10-CM | POA: Diagnosis not present

## 2016-10-28 DIAGNOSIS — Z8673 Personal history of transient ischemic attack (TIA), and cerebral infarction without residual deficits: Secondary | ICD-10-CM | POA: Diagnosis not present

## 2016-10-28 DIAGNOSIS — Z79899 Other long term (current) drug therapy: Secondary | ICD-10-CM | POA: Diagnosis not present

## 2016-10-28 DIAGNOSIS — G214 Vascular parkinsonism: Secondary | ICD-10-CM | POA: Diagnosis not present

## 2016-10-28 DIAGNOSIS — Z881 Allergy status to other antibiotic agents status: Secondary | ICD-10-CM | POA: Diagnosis not present

## 2016-10-28 DIAGNOSIS — R030 Elevated blood-pressure reading, without diagnosis of hypertension: Secondary | ICD-10-CM | POA: Diagnosis not present

## 2016-10-28 DIAGNOSIS — I10 Essential (primary) hypertension: Secondary | ICD-10-CM | POA: Diagnosis not present

## 2016-10-28 DIAGNOSIS — F0280 Dementia in other diseases classified elsewhere without behavioral disturbance: Secondary | ICD-10-CM | POA: Diagnosis not present

## 2016-10-28 DIAGNOSIS — E079 Disorder of thyroid, unspecified: Secondary | ICD-10-CM | POA: Diagnosis not present

## 2016-10-28 DIAGNOSIS — N3000 Acute cystitis without hematuria: Secondary | ICD-10-CM | POA: Diagnosis not present

## 2016-10-28 DIAGNOSIS — G309 Alzheimer's disease, unspecified: Secondary | ICD-10-CM | POA: Diagnosis not present

## 2016-11-14 DIAGNOSIS — R339 Retention of urine, unspecified: Secondary | ICD-10-CM | POA: Diagnosis not present

## 2016-11-14 DIAGNOSIS — K5901 Slow transit constipation: Secondary | ICD-10-CM | POA: Diagnosis not present

## 2016-11-14 DIAGNOSIS — F0280 Dementia in other diseases classified elsewhere without behavioral disturbance: Secondary | ICD-10-CM | POA: Diagnosis not present

## 2016-11-14 DIAGNOSIS — N390 Urinary tract infection, site not specified: Secondary | ICD-10-CM | POA: Diagnosis not present

## 2016-11-14 DIAGNOSIS — G459 Transient cerebral ischemic attack, unspecified: Secondary | ICD-10-CM | POA: Diagnosis not present

## 2016-11-14 DIAGNOSIS — Z87440 Personal history of urinary (tract) infections: Secondary | ICD-10-CM | POA: Diagnosis not present

## 2016-11-14 DIAGNOSIS — E039 Hypothyroidism, unspecified: Secondary | ICD-10-CM | POA: Diagnosis not present

## 2016-11-28 DIAGNOSIS — F0280 Dementia in other diseases classified elsewhere without behavioral disturbance: Secondary | ICD-10-CM | POA: Diagnosis not present

## 2016-11-28 DIAGNOSIS — G3183 Dementia with Lewy bodies: Secondary | ICD-10-CM | POA: Diagnosis not present

## 2016-11-28 DIAGNOSIS — R339 Retention of urine, unspecified: Secondary | ICD-10-CM | POA: Diagnosis not present

## 2016-11-28 DIAGNOSIS — G309 Alzheimer's disease, unspecified: Secondary | ICD-10-CM | POA: Diagnosis not present

## 2016-11-28 DIAGNOSIS — Z466 Encounter for fitting and adjustment of urinary device: Secondary | ICD-10-CM | POA: Diagnosis not present

## 2016-11-28 DIAGNOSIS — N390 Urinary tract infection, site not specified: Secondary | ICD-10-CM | POA: Diagnosis not present

## 2016-12-08 DIAGNOSIS — F0280 Dementia in other diseases classified elsewhere without behavioral disturbance: Secondary | ICD-10-CM | POA: Diagnosis not present

## 2016-12-08 DIAGNOSIS — G309 Alzheimer's disease, unspecified: Secondary | ICD-10-CM | POA: Diagnosis not present

## 2016-12-08 DIAGNOSIS — F0150 Vascular dementia without behavioral disturbance: Secondary | ICD-10-CM | POA: Diagnosis not present

## 2016-12-08 DIAGNOSIS — Z466 Encounter for fitting and adjustment of urinary device: Secondary | ICD-10-CM | POA: Diagnosis not present

## 2016-12-08 DIAGNOSIS — R339 Retention of urine, unspecified: Secondary | ICD-10-CM | POA: Diagnosis not present

## 2016-12-08 DIAGNOSIS — G3183 Dementia with Lewy bodies: Secondary | ICD-10-CM | POA: Diagnosis not present

## 2016-12-12 DIAGNOSIS — Z0000 Encounter for general adult medical examination without abnormal findings: Secondary | ICD-10-CM | POA: Diagnosis not present

## 2016-12-13 DIAGNOSIS — N390 Urinary tract infection, site not specified: Secondary | ICD-10-CM | POA: Diagnosis not present

## 2016-12-19 DIAGNOSIS — E039 Hypothyroidism, unspecified: Secondary | ICD-10-CM | POA: Diagnosis not present

## 2016-12-19 DIAGNOSIS — M6281 Muscle weakness (generalized): Secondary | ICD-10-CM | POA: Diagnosis not present

## 2017-01-09 DIAGNOSIS — L89301 Pressure ulcer of unspecified buttock, stage 1: Secondary | ICD-10-CM | POA: Diagnosis not present

## 2017-01-09 DIAGNOSIS — R339 Retention of urine, unspecified: Secondary | ICD-10-CM | POA: Diagnosis not present

## 2017-01-09 DIAGNOSIS — Z87440 Personal history of urinary (tract) infections: Secondary | ICD-10-CM | POA: Diagnosis not present

## 2017-01-09 DIAGNOSIS — F320 Major depressive disorder, single episode, mild: Secondary | ICD-10-CM | POA: Diagnosis not present

## 2017-01-09 DIAGNOSIS — G20 Parkinson's disease: Secondary | ICD-10-CM | POA: Diagnosis not present

## 2017-01-09 DIAGNOSIS — E039 Hypothyroidism, unspecified: Secondary | ICD-10-CM | POA: Diagnosis not present

## 2017-01-09 DIAGNOSIS — G459 Transient cerebral ischemic attack, unspecified: Secondary | ICD-10-CM | POA: Diagnosis not present

## 2017-01-09 DIAGNOSIS — K5901 Slow transit constipation: Secondary | ICD-10-CM | POA: Diagnosis not present

## 2017-01-09 DIAGNOSIS — F0280 Dementia in other diseases classified elsewhere without behavioral disturbance: Secondary | ICD-10-CM | POA: Diagnosis not present

## 2017-01-23 DIAGNOSIS — F320 Major depressive disorder, single episode, mild: Secondary | ICD-10-CM | POA: Diagnosis not present

## 2017-01-23 DIAGNOSIS — F0280 Dementia in other diseases classified elsewhere without behavioral disturbance: Secondary | ICD-10-CM | POA: Diagnosis not present

## 2017-01-23 DIAGNOSIS — G20 Parkinson's disease: Secondary | ICD-10-CM | POA: Diagnosis not present

## 2017-02-02 ENCOUNTER — Emergency Department (HOSPITAL_COMMUNITY): Payer: Medicare HMO

## 2017-02-02 ENCOUNTER — Encounter (HOSPITAL_COMMUNITY): Payer: Self-pay

## 2017-02-02 ENCOUNTER — Inpatient Hospital Stay (HOSPITAL_COMMUNITY)
Admission: EM | Admit: 2017-02-02 | Discharge: 2017-02-06 | DRG: 698 | Disposition: A | Discharge status: Skilled Nursing Facility | Payer: Medicare HMO | Attending: Family Medicine | Admitting: Family Medicine

## 2017-02-02 DIAGNOSIS — L89312 Pressure ulcer of right buttock, stage 2: Secondary | ICD-10-CM | POA: Diagnosis present

## 2017-02-02 DIAGNOSIS — R410 Disorientation, unspecified: Secondary | ICD-10-CM | POA: Diagnosis present

## 2017-02-02 DIAGNOSIS — R9389 Abnormal findings on diagnostic imaging of other specified body structures: Secondary | ICD-10-CM

## 2017-02-02 DIAGNOSIS — R0600 Dyspnea, unspecified: Secondary | ICD-10-CM

## 2017-02-02 DIAGNOSIS — T83511A Infection and inflammatory reaction due to indwelling urethral catheter, initial encounter: Principal | ICD-10-CM | POA: Diagnosis present

## 2017-02-02 DIAGNOSIS — Z79899 Other long term (current) drug therapy: Secondary | ICD-10-CM

## 2017-02-02 DIAGNOSIS — Z96642 Presence of left artificial hip joint: Secondary | ICD-10-CM | POA: Diagnosis present

## 2017-02-02 DIAGNOSIS — F0390 Unspecified dementia without behavioral disturbance: Secondary | ICD-10-CM | POA: Diagnosis not present

## 2017-02-02 DIAGNOSIS — L89152 Pressure ulcer of sacral region, stage 2: Secondary | ICD-10-CM | POA: Diagnosis present

## 2017-02-02 DIAGNOSIS — R918 Other nonspecific abnormal finding of lung field: Secondary | ICD-10-CM | POA: Diagnosis not present

## 2017-02-02 DIAGNOSIS — F329 Major depressive disorder, single episode, unspecified: Secondary | ICD-10-CM | POA: Diagnosis present

## 2017-02-02 DIAGNOSIS — N3000 Acute cystitis without hematuria: Secondary | ICD-10-CM | POA: Diagnosis present

## 2017-02-02 DIAGNOSIS — Y846 Urinary catheterization as the cause of abnormal reaction of the patient, or of later complication, without mention of misadventure at the time of the procedure: Secondary | ICD-10-CM | POA: Diagnosis present

## 2017-02-02 DIAGNOSIS — R4182 Altered mental status, unspecified: Secondary | ICD-10-CM | POA: Diagnosis not present

## 2017-02-02 DIAGNOSIS — M1990 Unspecified osteoarthritis, unspecified site: Secondary | ICD-10-CM | POA: Diagnosis present

## 2017-02-02 DIAGNOSIS — I10 Essential (primary) hypertension: Secondary | ICD-10-CM | POA: Diagnosis present

## 2017-02-02 DIAGNOSIS — E876 Hypokalemia: Secondary | ICD-10-CM | POA: Diagnosis present

## 2017-02-02 DIAGNOSIS — Z993 Dependence on wheelchair: Secondary | ICD-10-CM | POA: Diagnosis not present

## 2017-02-02 DIAGNOSIS — R4781 Slurred speech: Secondary | ICD-10-CM | POA: Diagnosis not present

## 2017-02-02 DIAGNOSIS — E038 Other specified hypothyroidism: Secondary | ICD-10-CM | POA: Diagnosis not present

## 2017-02-02 DIAGNOSIS — G214 Vascular parkinsonism: Secondary | ICD-10-CM

## 2017-02-02 DIAGNOSIS — Z8673 Personal history of transient ischemic attack (TIA), and cerebral infarction without residual deficits: Secondary | ICD-10-CM

## 2017-02-02 DIAGNOSIS — J9811 Atelectasis: Secondary | ICD-10-CM | POA: Diagnosis not present

## 2017-02-02 DIAGNOSIS — G9341 Metabolic encephalopathy: Secondary | ICD-10-CM | POA: Diagnosis not present

## 2017-02-02 DIAGNOSIS — Z7902 Long term (current) use of antithrombotics/antiplatelets: Secondary | ICD-10-CM

## 2017-02-02 DIAGNOSIS — F0150 Vascular dementia without behavioral disturbance: Secondary | ICD-10-CM | POA: Diagnosis not present

## 2017-02-02 DIAGNOSIS — E860 Dehydration: Secondary | ICD-10-CM | POA: Diagnosis present

## 2017-02-02 DIAGNOSIS — Z882 Allergy status to sulfonamides status: Secondary | ICD-10-CM

## 2017-02-02 DIAGNOSIS — I6789 Other cerebrovascular disease: Secondary | ICD-10-CM | POA: Diagnosis not present

## 2017-02-02 DIAGNOSIS — N390 Urinary tract infection, site not specified: Secondary | ICD-10-CM | POA: Diagnosis present

## 2017-02-02 DIAGNOSIS — E039 Hypothyroidism, unspecified: Secondary | ICD-10-CM | POA: Diagnosis not present

## 2017-02-02 DIAGNOSIS — F03C0 Unspecified dementia, severe, without behavioral disturbance, psychotic disturbance, mood disturbance, and anxiety: Secondary | ICD-10-CM | POA: Diagnosis present

## 2017-02-02 DIAGNOSIS — L8990 Pressure ulcer of unspecified site, unspecified stage: Secondary | ICD-10-CM | POA: Insufficient documentation

## 2017-02-02 LAB — COMPREHENSIVE METABOLIC PANEL
ALBUMIN: 3.4 g/dL — AB (ref 3.5–5.0)
ALK PHOS: 90 U/L (ref 38–126)
ALT: 14 U/L (ref 14–54)
AST: 19 U/L (ref 15–41)
Anion gap: 10 (ref 5–15)
BUN: 11 mg/dL (ref 6–20)
CALCIUM: 8.8 mg/dL — AB (ref 8.9–10.3)
CHLORIDE: 104 mmol/L (ref 101–111)
CO2: 25 mmol/L (ref 22–32)
CREATININE: 0.54 mg/dL (ref 0.44–1.00)
GFR calc Af Amer: >60 mL/min (ref >60)
GFR calc non Af Amer: >60 mL/min (ref >60)
GLUCOSE: 101 mg/dL — AB (ref 65–99)
Potassium: 3.9 mmol/L (ref 3.5–5.1)
Sodium: 139 mmol/L (ref 135–145)
Total Bilirubin: 0.7 mg/dL (ref 0.3–1.2)
Total Protein: 6.7 g/dL (ref 6.5–8.1)

## 2017-02-02 LAB — URINALYSIS, ROUTINE W REFLEX MICROSCOPIC
BILIRUBIN URINE: NEGATIVE
Glucose, UA: NEGATIVE mg/dL
KETONES UR: 5 mg/dL — AB
Nitrite: NEGATIVE
PH: 8 (ref 5.0–8.0)
Protein, ur: 100 mg/dL — AB
SPECIFIC GRAVITY, URINE: 1.016 (ref 1.005–1.030)

## 2017-02-02 LAB — CBC
HCT: 38.1 % (ref 36.0–46.0)
Hemoglobin: 12.2 g/dL (ref 12.0–15.0)
MCH: 28.2 pg (ref 26.0–34.0)
MCHC: 32 g/dL (ref 30.0–36.0)
MCV: 88 fL (ref 78.0–100.0)
PLATELETS: 287 10*3/uL (ref 150–400)
RBC: 4.33 MIL/uL (ref 3.87–5.11)
RDW: 15 % (ref 11.5–15.5)
WBC: 9.7 10*3/uL (ref 4.0–10.5)

## 2017-02-02 LAB — CBG MONITORING, ED: GLUCOSE-CAPILLARY: 92 mg/dL (ref 65–99)

## 2017-02-02 LAB — I-STAT CHEM 8, ED
BUN: 15 mg/dL (ref 6–20)
CALCIUM ION: 1.13 mmol/L — AB (ref 1.15–1.40)
CREATININE: 0.5 mg/dL (ref 0.44–1.00)
Chloride: 104 mmol/L (ref 101–111)
Glucose, Bld: 98 mg/dL (ref 65–99)
HCT: 37 % (ref 36.0–46.0)
HEMOGLOBIN: 12.6 g/dL (ref 12.0–15.0)
Potassium: 4.1 mmol/L (ref 3.5–5.1)
Sodium: 140 mmol/L (ref 135–145)
TCO2: 30 mmol/L (ref 0–100)

## 2017-02-02 LAB — I-STAT VENOUS BLOOD GAS, ED
Acid-Base Excess: 4 mmol/L — ABNORMAL HIGH (ref 0.0–2.0)
BICARBONATE: 29.5 mmol/L — AB (ref 20.0–28.0)
O2 Saturation: 96 %
PCO2 VEN: 48.4 mmHg (ref 44.0–60.0)
PH VEN: 7.393 (ref 7.250–7.430)
TCO2: 31 mmol/L (ref 0–100)
pO2, Ven: 83 mmHg — ABNORMAL HIGH (ref 32.0–45.0)

## 2017-02-02 LAB — PROTIME-INR
INR: 1.06
Prothrombin Time: 13.8 seconds (ref 11.4–15.2)

## 2017-02-02 LAB — I-STAT CG4 LACTIC ACID, ED: Lactic Acid, Venous: 1.22 mmol/L (ref 0.5–1.9)

## 2017-02-02 MED ORDER — CEFAZOLIN IN D5W 1 GM/50ML IV SOLN
1.0000 g | Freq: Three times a day (TID) | INTRAVENOUS | Status: DC
  Administered 2017-02-02 – 2017-02-05 (×9): 1 g via INTRAVENOUS
  Filled 2017-02-02 (×11): qty 50

## 2017-02-02 MED ORDER — ENOXAPARIN SODIUM 40 MG/0.4ML ~~LOC~~ SOLN
40.0000 mg | SUBCUTANEOUS | Status: DC
  Administered 2017-02-03 – 2017-02-06 (×4): 40 mg via SUBCUTANEOUS
  Filled 2017-02-02 (×4): qty 0.4

## 2017-02-02 MED ORDER — STROKE: EARLY STAGES OF RECOVERY BOOK
Freq: Once | Status: AC
  Administered 2017-02-03: 1
  Filled 2017-02-02: qty 1

## 2017-02-02 MED ORDER — SODIUM CHLORIDE 0.9 % IV SOLN
INTRAVENOUS | Status: DC
  Administered 2017-02-02 – 2017-02-03 (×2): via INTRAVENOUS
  Administered 2017-02-04: 1000 mL via INTRAVENOUS
  Administered 2017-02-04: 05:00:00 via INTRAVENOUS

## 2017-02-02 MED ORDER — PIPERACILLIN-TAZOBACTAM 3.375 G IVPB 30 MIN: 3.3750 g | Freq: Once | INTRAVENOUS | Status: DC

## 2017-02-02 MED ORDER — LORAZEPAM 0.5 MG PO TABS
0.5000 mg | ORAL_TABLET | Freq: Once | ORAL | Status: DC | PRN
Start: 2017-02-02 — End: 2017-02-06

## 2017-02-02 MED ORDER — ACETAMINOPHEN 325 MG PO TABS
650.0000 mg | ORAL_TABLET | Freq: Four times a day (QID) | ORAL | Status: DC | PRN
  Filled 2017-02-02: qty 2

## 2017-02-02 MED ORDER — SODIUM CHLORIDE 0.9 % IV BOLUS (SEPSIS)
1000.0000 mL | Freq: Once | INTRAVENOUS | Status: AC
  Administered 2017-02-02: 1000 mL via INTRAVENOUS

## 2017-02-02 MED ORDER — ACETAMINOPHEN 650 MG RE SUPP: 650.0000 mg | Freq: Four times a day (QID) | RECTAL | Status: DC | PRN

## 2017-02-02 NOTE — ED Notes (Signed)
Report called to [facility]

## 2017-02-02 NOTE — ED Notes (Signed)
Attempted to call report, RN will return call to ED

## 2017-02-02 NOTE — ED Triage Notes (Signed)
Pt from [facility] by [facility] for altered mental status. Pt has dementia at baseline and today her speech is different than normal per facility. Pt has an indewelling folly with possible UTI.

## 2017-02-02 NOTE — H&P (Addendum)
History and Physical  Patient Name: [name]     LNL:[id number]    DOB: March 09, 1931    DOA: 02/02/2017 PCP: [name], MD   Patient coming from: [place]  Chief Complaint: Confusion, slurred speech  HPI: [name] is a 83 y.o. female with a past medical history significant for advanced dementia, hx of stroke (most recently 04/2016), hypothyroidism, and indwelling foley because of dementia, urinary retention who presents with 1 week confusion and slurred speech.   The patient is unable to provider her own history, which is collected from daughter/POA at bedside.  Patient at baseline is wheelchair bound for last year, but interactive, not oriented to year or place at baseline, lives in [place] unit at [facility].  Per family, about 2 weeks ago, she started to develop a foul smell in her room like urine, complained to her daughter about pain at her foley, and had new sediment in her foley bag.  Daughter asked staff or MD at facility to treat her for UTI, but they just changed the foley (about 1 week ago) and did not send UA (per daughter report) because she had no fever.  Then in the last few days she has become more altered and less responsive than usual.  Daughter went to visit her today and she was mumbling and slurring her words which is not like her.  There was no obvious focal weakness, no facial droop, no fever, no cough, no sputum.    ED course: -Afebrile, HR 85, respirations and pulse ox normal, BP 139/72 -Na 139, K 3.9, Cr 0.54, WBC 9.7K, Hgb 12.2 -Lactic acid 1.22 -UA drawn from foley had WBC/RBC TNTC and sediment -1V portable CXR showed "vascular congestion" that was possibly asymmetric     ROS: Review of Systems  Unable to perform ROS: Dementia    And delirium      Past Medical History:  Diagnosis Date  . Alcohol abuse   . Arthritis   . Depression   . Stroke ([place])   . Thyroid disease    hypothyroid    Past Surgical History:   Procedure Laterality Date  . ABDOMINAL HYSTERECTOMY     partial  . CHOLECYSTECTOMY  2008  . THYROID SURGERY    . TONSILLECTOMY AND ADENOIDECTOMY    . TOTAL HIP ARTHROPLASTY Left 09/24/2014   Procedure: TOTAL HIP ARTHROPLASTY ANTERIOR APPROACH, LEFT;  Surgeon: Mcarthur Rossetti, MD;  Location: [facility];  Service: Orthopedics;  Laterality: Left;    Social History: [place] [facility].  The patient is wheelchair bound.  She is not a smoker.  She has advanced dmeentia.  Allergies  Allergen Reactions  . Citalopram Hydrobromide     Shaky inside  . Sulfa Antibiotics     rash  . Ciprofloxacin Rash    Family history: family history includes Arthritis in her mother; Cancer in her daughter, father, and sister; Diabetes in her brother; Hyperlipidemia in her mother; Hypertension in her brother, mother, and sister; Mental illness in her mother; Stroke in her mother and sister.  Prior to Admission medications   Medication Sig Start Date End Date Taking? Authorizing Provider  acetaminophen (TYLENOL) 325 MG tablet Take 325 mg by mouth every 6 (six) hours as needed for mild pain (pain).    Yes Historical Provider, MD  atorvastatin (LIPITOR) 20 MG tablet Take 1 tablet (20 mg total) by mouth daily at 6 PM. 05/19/16  Yes Irine Seal  V, MD  clopidogrel (PLAVIX) 75 MG tablet Take 75 mg by mouth daily.   Yes Historical Provider, MD  levothyroxine (SYNTHROID, LEVOTHROID) 150 MCG tablet Take 150 mcg by mouth daily before breakfast.   Yes Historical Provider, MD  miconazole (MICOTIN) 2 % cream Apply 1 application topically 2 (two) times daily.   Yes Historical Provider, MD  Multiple Vitamin (MULTIVITAMIN) tablet Take 1 tablet by mouth daily.   Yes Historical Provider, MD  nystatin cream (MYCOSTATIN) Apply 1 application topically 2 (two) times daily.   Yes Historical Provider, MD  Probiotic Product (ACIDOPHILUS/GOAT MILK) CAPS Take 1 capsule by mouth daily.    Yes Historical  Provider, MD  sertraline (ZOLOFT) 25 MG tablet Take 25 mg by mouth daily.   Yes Historical Provider, MD  aspirin 325 MG tablet Take 1 tablet (325 mg total) by mouth daily. Patient not taking: Reported on 02/02/2017 05/19/16   Eugenie Filler, MD  donepezil (ARICEPT) 10 MG tablet Take 1 tablet (10 mg total) by mouth at bedtime. Patient not taking: Reported on 02/02/2017 02/18/15   Eustace Quail Tat, DO  senna (SENOKOT) 8.6 MG TABS tablet Take 1 tablet (8.6 mg total) by mouth daily as needed for mild constipation. Patient not taking: Reported on 02/02/2017 09/28/14   Nishant Dhungel, MD  vitamin B-12 1000 MCG tablet Take 1 tablet (1,000 mcg total) by mouth daily. Take for 2 weeks. Patient not taking: Reported on 02/02/2017 05/19/16   Eugenie Filler, MD       Physical Exam: BP (!) 147/59   Pulse 81   Resp 16   SpO2 97%  General appearance: Well-developed, elderly adult female, confused and in no acute distress.   Eyes: Anicteric, conjunctiva pink, lids and lashes normal. PERRL.    ENT: No nasal deformity, discharge, epistaxis.  Hearing unable to assess. OP dry without lesions.   Skin: Warm and dry.  No jaundice.  No suspicious rashes or lesions other than skin breakdown around foley, without cellulitis of perineum. Cardiac: RRR, nl S1-S2, no murmurs appreciated.  Capillary refill is brisk.  JVP normal.  No LE edema.  Radial and DP pulses 2+ and symmetric. Respiratory: Normal respiratory rate and rhythm.  CTAB without rales or wheezes. Abdomen: Abdomen soft.  No TTP. No ascites, distension, hepatosplenomegaly.   MSK: No deformities or effusions.  No cyanosis or clubbing. Neuro: PERRL.  No gaze preference.  Seems to move upper extremities weakly but equally. Speech is slurred.   Otherwise does not follow commands.  Too frail to stand. Psych: Not able to assess.  Not able to answer questions intelligibly.        Labs on Admission:  I have personally reviewed following labs and imaging  studies: CBC:  Recent Labs Lab 02/02/17 1723 02/02/17 1745  WBC 9.7  --   HGB 12.2 12.6  HCT 38.1 37.0  MCV 88.0  --   PLT 287  --    Basic Metabolic Panel:  Recent Labs Lab 02/02/17 1723 02/02/17 1745  NA 139 140  K 3.9 4.1  CL 104 104  CO2 25  --   GLUCOSE 101* 98  BUN 11 15  CREATININE 0.54 0.50  CALCIUM 8.8*  --    GFR: CrCl cannot be calculated (Unknown ideal weight.).  Liver Function Tests:  Recent Labs Lab 02/02/17 1723  AST 19  ALT 14  ALKPHOS 90  BILITOT 0.7  PROT 6.7  ALBUMIN 3.4*   No results for input(s): LIPASE, AMYLASE in  the last 168 hours. No results for input(s): AMMONIA in the last 168 hours. Coagulation Profile:  Recent Labs Lab 02/02/17 1723  INR 1.06   Cardiac Enzymes: No results for input(s): CKTOTAL, CKMB, CKMBINDEX, TROPONINI in the last 168 hours. BNP (last 3 results) No results for input(s): PROBNP in the last 8760 hours. HbA1C: No results for input(s): HGBA1C in the last 72 hours. CBG:  Recent Labs Lab 02/02/17 1713  GLUCAP 92   Lipid Profile: No results for input(s): CHOL, HDL, LDLCALC, TRIG, CHOLHDL, LDLDIRECT in the last 72 hours. Thyroid Function Tests: No results for input(s): TSH, T4TOTAL, FREET4, T3FREE, THYROIDAB in the last 72 hours. Anemia Panel: No results for input(s): VITAMINB12, FOLATE, FERRITIN, TIBC, IRON, RETICCTPCT in the last 72 hours. Sepsis Labs: Lactic acid 1.22 Invalid input(s): PROCALCITONIN, LACTICIDVEN No results found for this or any previous visit (from the past 240 hour(s)).       Radiological Exams on Admission: Personally reviewed CT head report, personally reviewed CXR shows possible edema: Ct Head Wo Contrast  Result Date: 02/02/2017 CLINICAL DATA:  83 year old female with history of altered mental status. Dementia. EXAM: CT HEAD WITHOUT CONTRAST TECHNIQUE: Contiguous axial images were obtained from the base of the skull through the vertex without intravenous contrast.  COMPARISON:  Head CT 05/17/2016.  Brain MRI 05/17/2016. FINDINGS: Comment:  Study is slightly limited by considerable patient motion. Brain: Patchy and confluent areas of decreased attenuation are noted throughout the deep and periventricular white matter of the cerebral hemispheres bilaterally, compatible with severe chronic microvascular ischemic disease. No evidence of acute infarction, hemorrhage, hydrocephalus, extra-axial collection or mass lesion/mass effect. Vascular: No hyperdense vessel or unexpected calcification. Skull: Normal. Negative for fracture or focal lesion. Sinuses/Orbits: No acute finding. Other: None. IMPRESSION: 1. No acute intracranial abnormalities on today's motion limited examination. 2. Extensive chronic microvascular ischemic changes in the cerebral white matter, as above. Electronically Signed   By: Vinnie Langton M.D.   On: 02/02/2017 18:35   Dg Chest Port 1 View  Result Date: 02/02/2017 CLINICAL DATA:  Acute mental status change. EXAM: PORTABLE CHEST 1 VIEW COMPARISON:  May 17, 2016 FINDINGS: No pneumothorax. The cardiomediastinal silhouette is stable with a tortuous thoracic aorta. Mild increased interstitial markings. Mild hazy opacity in left base. IMPRESSION: Suggested mild pulmonary venous congestion. Subtle but more focal opacity in the left base could represent than early infiltrate. Recommend follow-up to resolution. Electronically Signed   By: Dorise Bullion III M.D   On: 02/02/2017 17:58    EKG: Independently reviewed. Rate 85, QTc 503, old bifascicular block, no change from previous, no ST changtes.   Echocardiogram report 2017 personally reviewed: EF 60-65% Grade I DD    Assessment/Plan Principal Problem:   Confusion Active Problems:   Hypothyroid   Essential hypertension, benign   Severe dementia   Dehydration   UTI (urinary tract infection) due to urinary indwelling Foley catheter ([place])  1. Confusion:  Probably multifactorial from  dehydration, UTI insetting of dementia. Cannot rule out stroke. -Fluids -Treat/workup UTI as below -Neuro checks -Stroke swallow -Obtain MRI brain, positive we'll continue stroke workup and consult neurology; if not we'll transferred to [place] floor     2. Foley catheter associated UTI:  From chronic indwelling Foley, not hospital-acquired.  Has had multiple species in past.  Will treat empirically with narrow coverage because she has no fever, SIRS, WBC elevation, lactate abnormality. -Cefazolin IV -Follow urine culture from bag -Repeat UA and culture after foley placed  3.  Abnormal chest x-ray:  Chest x-ray difficult to interpret given portable, poor quality. Repeat recommended. -Repeat chest x-ray tomorrow after fluids -Check BNP  4. History of stroke:  -Continue Plavix, statin  5. Hypothyroidism:  -Check TSH -Continue levothyroxine  6. Other medications:  -Continue sertraline  7. SNF acquired pressure wound: -Consult [facility]      DVT prophylaxis: Lovenox  Code Status: FULL  Family Communication: Daughter at bedside  Disposition Plan: Anticipate MRI brain tonight, then follow urine, given fluids Consults called: None Admission status: OBS At the point of initial evaluation, it is my clinical opinion that admission for OBSERVATION is reasonable and necessary because the patient's presenting complaints in the context of their chronic conditions represent sufficient risk of deterioration or significant morbidity to constitute reasonable grounds for close observation in the hospital setting, but that the patient may be medically stable for discharge from the hospital within 24 to 48 hours.    Medical decision making: Patient seen at 7:20 PM on 02/02/2017.  The patient was discussed with Dr.Floyd.  What exists of the patient's chart was reviewed in depth and summarized above.  Clinical condition: stable.        Edwin Dada [facility] Pager [phone]

## 2017-02-02 NOTE — ED Notes (Signed)
cbg was 92

## 2017-02-02 NOTE — ED Provider Notes (Signed)
[facility] DEPT Provider Note   CSN: [id number] Arrival date & time: 02/02/17  1659     History   Chief Complaint Chief Complaint  Patient presents with  . Altered Mental Status    HPI [name] is a 83 y.o. female.  83 yo F with a chief complaints of altered mental status. Per the family she has been getting worse and worse confused over the past couple weeks. They've been trying to get her urine tested because they're concerned that she has recurrent urinary tract infections. She does have an indwelling Foley. They deny any injury to the head.    The history is provided by the patient, the EMS personnel and a relative.  Altered Mental Status   This is a recurrent problem. The current episode started more than 2 days ago. The problem has not changed since onset.Associated symptoms include confusion and weakness.  Illness  This is a recurrent problem. The current episode started more than 2 days ago. The problem occurs constantly. The problem has not changed since onset.Pertinent negatives include no chest pain, no headaches and no shortness of breath. Nothing aggravates the symptoms. Nothing relieves the symptoms. She has tried nothing for the symptoms. The treatment provided no relief.    Past Medical History:  Diagnosis Date  . Alcohol abuse   . Arthritis   . Depression   . Stroke ([place])   . Thyroid disease    hypothyroid    Patient Active Problem List   Diagnosis Date Noted  . Confusion 02/02/2017  . CVA (cerebral vascular accident) ([place]) 05/18/2016  . B12 deficiency 05/18/2016  . Encephalopathy acute 05/17/2016  . Encephalopathy 05/17/2016  . Acute UTI 05/17/2016  . Leukocytosis 05/17/2016  . Dehydration 05/17/2016  . H/O: CVA (cerebrovascular accident) 05/17/2016  . UTI (urinary tract infection) due to urinary indwelling Foley catheter ([place]) 05/17/2016  . Acute encephalopathy 05/17/2016  . Postoperative anemia due to acute blood loss 09/28/2014  .  Urinary retention 09/28/2014  . Infection of urinary tract 09/28/2014  . Severe dementia 09/28/2014  . Left displaced femoral neck fracture ([place]) 09/23/2014  . Vascular dementia 09/17/2014  . 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 Essential hypertension, benign 07/22/2013  . Acute posthemorrhagic anemia 07/22/2013  . Anemia 07/03/2013  . Constipation 07/03/2013  . Insomnia 07/03/2013  . Recurrent UTI 03/27/2013  . Preoperative evaluation to rule out surgical contraindication 12/24/2012  . Cognitive impairment 04/02/2012  . Osteoarthritis of knee 03/12/2011  . Hypothyroid 02/26/2011  . TIA (transient ischemic attack) 02/26/2011  . Depression 02/26/2011  . Hyperlipemia 02/26/2011  . Alcohol abuse 02/26/2011    Past Surgical History:  Procedure Laterality Date  . ABDOMINAL HYSTERECTOMY     partial  . CHOLECYSTECTOMY  2008  . THYROID SURGERY    . TONSILLECTOMY AND ADENOIDECTOMY    . TOTAL HIP ARTHROPLASTY Left 09/24/2014   Procedure: TOTAL HIP ARTHROPLASTY ANTERIOR APPROACH, LEFT;  Surgeon: Mcarthur Rossetti, MD;  Location: [facility] ORS;  Service: Orthopedics;  Laterality: Left;    OB History    No data available       Home Medications    Prior to Admission medications   Medication Sig Start Date End Date Taking? Authorizing Provider  acetaminophen (TYLENOL) 325 MG tablet Take 325 mg by mouth every 6 (six) hours as needed for mild pain (pain).    Yes Historical Provider, MD  atorvastatin (LIPITOR) 20 MG tablet Take 1 tablet (20 mg total) by mouth daily at 6 PM.  05/19/16  Yes Eugenie Filler, MD  clopidogrel (PLAVIX) 75 MG tablet Take 75 mg by mouth daily.   Yes Historical Provider, MD  levothyroxine (SYNTHROID, LEVOTHROID) 150 MCG tablet Take 150 mcg by mouth daily before breakfast.   Yes Historical Provider, MD  miconazole (MICOTIN) 2 % cream Apply 1 application topically 2 (two) times daily.   Yes Historical Provider, MD  Multiple Vitamin (MULTIVITAMIN) tablet Take 1  tablet by mouth daily.   Yes Historical Provider, MD  nystatin cream (MYCOSTATIN) Apply 1 application topically 2 (two) times daily.   Yes Historical Provider, MD  Probiotic Product (ACIDOPHILUS/GOAT MILK) CAPS Take 1 capsule by mouth daily.    Yes Historical Provider, MD  sertraline (ZOLOFT) 25 MG tablet Take 25 mg by mouth daily.   Yes Historical Provider, MD  aspirin 325 MG tablet Take 1 tablet (325 mg total) by mouth daily. Patient not taking: Reported on 02/02/2017 05/19/16   Eugenie Filler, MD  donepezil (ARICEPT) 10 MG tablet Take 1 tablet (10 mg total) by mouth at bedtime. Patient not taking: Reported on 02/02/2017 02/18/15   Eustace Quail Tat, DO  senna (SENOKOT) 8.6 MG TABS tablet Take 1 tablet (8.6 mg total) by mouth daily as needed for mild constipation. Patient not taking: Reported on 02/02/2017 09/28/14   Nishant Dhungel, MD  vitamin B-12 1000 MCG tablet Take 1 tablet (1,000 mcg total) by mouth daily. Take for 2 weeks. Patient not taking: Reported on 02/02/2017 05/19/16   Eugenie Filler, MD    Family History Family History  Problem Relation Age of Onset  . Arthritis Mother   . Hyperlipidemia Mother   . Stroke Mother   . Hypertension Mother   . Mental illness Mother   . Cancer Father     lung  . Cancer Sister     ovarian/uterine  . Stroke Sister   . Hypertension Sister   . Hypertension Brother   . Diabetes Brother   . Cancer Daughter     breast    Social History Social History  Substance Use Topics  . Smoking status: Never Smoker  . Smokeless tobacco: Never Used  . Alcohol use No     Comment: used to drink      Allergies   Citalopram hydrobromide; Sulfa antibiotics; and Ciprofloxacin   Review of Systems Review of Systems  Constitutional: Positive for activity change. Negative for chills and fever.  HENT: Negative for congestion and rhinorrhea.   Eyes: Negative for redness and visual disturbance.  Respiratory: Negative for shortness of breath and wheezing.     Cardiovascular: Negative for chest pain and palpitations.  Gastrointestinal: Negative for nausea and vomiting.  Genitourinary: Negative for dysuria and urgency.  Musculoskeletal: Negative for arthralgias and myalgias.  Skin: Negative for pallor and wound.  Neurological: Positive for weakness. Negative for dizziness and headaches.  Psychiatric/Behavioral: Positive for confusion.     Physical Exam Updated Vital Signs BP 130/61   Pulse 81   Temp 97.6 F (36.4 C) (Rectal)   Resp 19   SpO2 99%   Physical Exam  Constitutional: She appears well-developed and well-nourished. No distress.  HENT:  Head: Normocephalic and atraumatic.  Eyes: EOM are normal. Pupils are equal, round, and reactive to light.  Neck: Normal range of motion. Neck supple.  Cardiovascular: Normal rate and regular rhythm.  Exam reveals no gallop and no friction rub.   No murmur heard. Pulmonary/Chest: Effort normal. She has no wheezes. She has no rales.  Abdominal:  Soft. She exhibits no distension and no mass. There is no tenderness. There is no guarding.  Musculoskeletal: She exhibits no edema or tenderness.  Neurological: She is alert.  Mumbling incoherent.  Follows some commands.  Equal upper extremity strength.  ? R sided facial droop, family feels similar in prior events  Skin: Skin is warm and dry. She is not diaphoretic.  Psychiatric: She has a normal mood and affect. Her behavior is normal.  Nursing note and vitals reviewed.    ED Treatments / Results  Labs (all labs ordered are listed, but only abnormal results are displayed) Labs Reviewed  COMPREHENSIVE METABOLIC PANEL - Abnormal; Notable for the following:       Result Value   Glucose, Bld 101 (*)    Calcium 8.8 (*)    Albumin 3.4 (*)    All other components within normal limits  URINALYSIS, ROUTINE W REFLEX MICROSCOPIC - Abnormal; Notable for the following:    APPearance TURBID (*)    Hgb urine dipstick MODERATE (*)    Ketones, ur 5 (*)     Protein, ur 100 (*)    Leukocytes, UA SMALL (*)    Bacteria, UA MANY (*)    Squamous Epithelial / LPF 0-5 (*)    Non Squamous Epithelial 0-5 (*)    All other components within normal limits  I-STAT CHEM 8, ED - Abnormal; Notable for the following:    Calcium, Ion 1.13 (*)    All other components within normal limits  I-STAT VENOUS BLOOD GAS, ED - Abnormal; Notable for the following:    pO2, Ven 83.0 (*)    Bicarbonate 29.5 (*)    Acid-Base Excess 4.0 (*)    All other components within normal limits  CULTURE, BLOOD (ROUTINE X 2)  CULTURE, BLOOD (ROUTINE X 2)  CBC  PROTIME-INR  BLOOD GAS, VENOUS  CBG MONITORING, ED  I-STAT CG4 LACTIC ACID, ED  I-STAT CG4 LACTIC ACID, ED    EKG  EKG Interpretation  Date/Time:  Saturday February 02 2017 17:05:12 EDT Ventricular Rate:  85 PR Interval:    QRS Duration: 146 QT Interval:  423 QTC Calculation: 503 R Axis:   99 Text Interpretation:  Sinus rhythm RBBB and LPFB No significant change since last tracing Confirmed by Troi Bechtold MD, DANIEL [phone]) on 02/02/2017 6:51:52 PM       Radiology Ct Head Wo Contrast  Result Date: 02/02/2017 CLINICAL DATA:  83 year old female with history of altered mental status. Dementia. EXAM: CT HEAD WITHOUT CONTRAST TECHNIQUE: Contiguous axial images were obtained from the base of the skull through the vertex without intravenous contrast. COMPARISON:  Head CT 05/17/2016.  Brain MRI 05/17/2016. FINDINGS: Comment:  Study is slightly limited by considerable patient motion. Brain: Patchy and confluent areas of decreased attenuation are noted throughout the deep and periventricular white matter of the cerebral hemispheres bilaterally, compatible with severe chronic microvascular ischemic disease. No evidence of acute infarction, hemorrhage, hydrocephalus, extra-axial collection or mass lesion/mass effect. Vascular: No hyperdense vessel or unexpected calcification. Skull: Normal. Negative for fracture or focal lesion.  Sinuses/Orbits: No acute finding. Other: None. IMPRESSION: 1. No acute intracranial abnormalities on today's motion limited examination. 2. Extensive chronic microvascular ischemic changes in the cerebral white matter, as above. Electronically Signed   By: Vinnie Langton M.D.   On: 02/02/2017 18:35   Dg Chest Port 1 View  Result Date: 02/02/2017 CLINICAL DATA:  Acute mental status change. EXAM: PORTABLE CHEST 1 VIEW COMPARISON:  May 17, 2016 FINDINGS:  No pneumothorax. The cardiomediastinal silhouette is stable with a tortuous thoracic aorta. Mild increased interstitial markings. Mild hazy opacity in left base. IMPRESSION: Suggested mild pulmonary venous congestion. Subtle but more focal opacity in the left base could represent than early infiltrate. Recommend follow-up to resolution. Electronically Signed   By: Dorise Bullion III M.D   On: 02/02/2017 17:58    Procedures Procedures (including critical care time)  Medications Ordered in ED Medications  ceFAZolin (ANCEF) IVPB 1 g/50 mL premix (1 g Intravenous New Bag/Given 02/02/17 2105)  0.9 %  sodium chloride infusion (not administered)  sodium chloride 0.9 % bolus 1,000 mL (0 mLs Intravenous Stopped 02/02/17 2027)     Initial Impression / Assessment and Plan / ED Course  I have reviewed the triage vital signs and the nursing notes.  Pertinent labs & imaging results that were available during my care of the patient were reviewed by me and considered in my medical decision making (see chart for details).     83 yo F with a cc of AMS.  Patient with hx of multiple UTI's.  UA concerning for UTI.  Prior cx with pseudomonas.  Give zosyn.    The patient has no focal deficits on exam. Family thinks that she has some mild right facial droop but this is consistent with a prior stroke that she had. No weakness though difficult with compliance on exam. CT that was normal. Admit.  The patients results and plan were reviewed and discussed.   Any  x-rays performed were independently reviewed by myself.   Differential diagnosis were considered with the presenting HPI.  Medications  ceFAZolin (ANCEF) IVPB 1 g/50 mL premix (1 g Intravenous New Bag/Given 02/02/17 2105)  0.9 %  sodium chloride infusion (not administered)  sodium chloride 0.9 % bolus 1,000 mL (0 mLs Intravenous Stopped 02/02/17 2027)    Vitals:   02/02/17 1800 02/02/17 1930 02/02/17 2015 02/02/17 2029  BP: (!) 140/50 (!) 147/59 130/61   Pulse:  81 81   Resp: 14 16 19    Temp:    97.6 F (36.4 C)  TempSrc:    Rectal  SpO2:  97% 99%     Final diagnoses:  Disorientation  Acute cystitis without hematuria    Admission/ observation were discussed with the admitting physician, patient and/or family and they are comfortable with the plan.    Final Clinical Impressions(s) / ED Diagnoses   Final diagnoses:  Disorientation  Acute cystitis without hematuria    New Prescriptions New Prescriptions   No medications on file     Deno Etienne, DO 02/02/17 2107

## 2017-02-03 ENCOUNTER — Observation Stay (HOSPITAL_COMMUNITY): Payer: Medicare HMO

## 2017-02-03 ENCOUNTER — Encounter (HOSPITAL_COMMUNITY): Payer: Self-pay | Admitting: Radiology

## 2017-02-03 DIAGNOSIS — Z882 Allergy status to sulfonamides status: Secondary | ICD-10-CM | POA: Diagnosis not present

## 2017-02-03 DIAGNOSIS — R4781 Slurred speech: Secondary | ICD-10-CM | POA: Diagnosis present

## 2017-02-03 DIAGNOSIS — F0390 Unspecified dementia without behavioral disturbance: Secondary | ICD-10-CM | POA: Diagnosis not present

## 2017-02-03 DIAGNOSIS — Z7902 Long term (current) use of antithrombotics/antiplatelets: Secondary | ICD-10-CM | POA: Diagnosis not present

## 2017-02-03 DIAGNOSIS — E876 Hypokalemia: Secondary | ICD-10-CM | POA: Diagnosis present

## 2017-02-03 DIAGNOSIS — R410 Disorientation, unspecified: Secondary | ICD-10-CM | POA: Diagnosis present

## 2017-02-03 DIAGNOSIS — E038 Other specified hypothyroidism: Secondary | ICD-10-CM | POA: Diagnosis not present

## 2017-02-03 DIAGNOSIS — L8990 Pressure ulcer of unspecified site, unspecified stage: Secondary | ICD-10-CM | POA: Insufficient documentation

## 2017-02-03 DIAGNOSIS — R918 Other nonspecific abnormal finding of lung field: Secondary | ICD-10-CM | POA: Diagnosis not present

## 2017-02-03 DIAGNOSIS — Z79899 Other long term (current) drug therapy: Secondary | ICD-10-CM | POA: Diagnosis not present

## 2017-02-03 DIAGNOSIS — N3000 Acute cystitis without hematuria: Secondary | ICD-10-CM | POA: Diagnosis present

## 2017-02-03 DIAGNOSIS — Z8673 Personal history of transient ischemic attack (TIA), and cerebral infarction without residual deficits: Secondary | ICD-10-CM | POA: Diagnosis not present

## 2017-02-03 DIAGNOSIS — L89152 Pressure ulcer of sacral region, stage 2: Secondary | ICD-10-CM | POA: Diagnosis present

## 2017-02-03 DIAGNOSIS — Z993 Dependence on wheelchair: Secondary | ICD-10-CM | POA: Diagnosis not present

## 2017-02-03 DIAGNOSIS — I10 Essential (primary) hypertension: Secondary | ICD-10-CM | POA: Diagnosis present

## 2017-02-03 DIAGNOSIS — E039 Hypothyroidism, unspecified: Secondary | ICD-10-CM | POA: Diagnosis present

## 2017-02-03 DIAGNOSIS — T83511A Infection and inflammatory reaction due to indwelling urethral catheter, initial encounter: Secondary | ICD-10-CM | POA: Diagnosis present

## 2017-02-03 DIAGNOSIS — E860 Dehydration: Secondary | ICD-10-CM | POA: Diagnosis present

## 2017-02-03 DIAGNOSIS — N390 Urinary tract infection, site not specified: Secondary | ICD-10-CM | POA: Diagnosis not present

## 2017-02-03 DIAGNOSIS — G9341 Metabolic encephalopathy: Secondary | ICD-10-CM | POA: Diagnosis present

## 2017-02-03 DIAGNOSIS — Y846 Urinary catheterization as the cause of abnormal reaction of the patient, or of later complication, without mention of misadventure at the time of the procedure: Secondary | ICD-10-CM | POA: Diagnosis present

## 2017-02-03 DIAGNOSIS — F329 Major depressive disorder, single episode, unspecified: Secondary | ICD-10-CM | POA: Diagnosis present

## 2017-02-03 DIAGNOSIS — L89312 Pressure ulcer of right buttock, stage 2: Secondary | ICD-10-CM | POA: Diagnosis present

## 2017-02-03 DIAGNOSIS — M1990 Unspecified osteoarthritis, unspecified site: Secondary | ICD-10-CM | POA: Diagnosis present

## 2017-02-03 DIAGNOSIS — F0150 Vascular dementia without behavioral disturbance: Secondary | ICD-10-CM | POA: Diagnosis present

## 2017-02-03 DIAGNOSIS — Z96642 Presence of left artificial hip joint: Secondary | ICD-10-CM | POA: Diagnosis present

## 2017-02-03 LAB — URINALYSIS, ROUTINE W REFLEX MICROSCOPIC
Bilirubin Urine: NEGATIVE
GLUCOSE, UA: NEGATIVE mg/dL
Ketones, ur: 5 mg/dL — AB
NITRITE: NEGATIVE
PROTEIN: 100 mg/dL — AB
SQUAMOUS EPITHELIAL / LPF: NONE SEEN
Specific Gravity, Urine: 1.017 (ref 1.005–1.030)
pH: 6 (ref 5.0–8.0)

## 2017-02-03 LAB — BASIC METABOLIC PANEL
Anion gap: 6 (ref 5–15)
BUN: 8 mg/dL (ref 6–20)
CALCIUM: 8 mg/dL — AB (ref 8.9–10.3)
CHLORIDE: 113 mmol/L — AB (ref 101–111)
CO2: 21 mmol/L — AB (ref 22–32)
CREATININE: 0.47 mg/dL (ref 0.44–1.00)
GFR calc Af Amer: >60 mL/min (ref >60)
GFR calc non Af Amer: >60 mL/min (ref >60)
GLUCOSE: 93 mg/dL (ref 65–99)
Potassium: 4.1 mmol/L (ref 3.5–5.1)
Sodium: 140 mmol/L (ref 135–145)

## 2017-02-03 LAB — LIPID PANEL
CHOL/HDL RATIO: 2.9 ratio
Cholesterol: 109 mg/dL (ref 0–200)
HDL: 37 mg/dL — AB (ref >40)
LDL CALC: 54 mg/dL (ref 0–99)
TRIGLYCERIDES: 89 mg/dL (ref <150)
VLDL: 18 mg/dL (ref 0–40)

## 2017-02-03 LAB — CBC
HEMATOCRIT: 33.9 % — AB (ref 36.0–46.0)
HEMOGLOBIN: 11.1 g/dL — AB (ref 12.0–15.0)
MCH: 28.8 pg (ref 26.0–34.0)
MCHC: 32.7 g/dL (ref 30.0–36.0)
MCV: 88.1 fL (ref 78.0–100.0)
Platelets: 255 10*3/uL (ref 150–400)
RBC: 3.85 MIL/uL — ABNORMAL LOW (ref 3.87–5.11)
RDW: 15 % (ref 11.5–15.5)
WBC: 6.8 10*3/uL (ref 4.0–10.5)

## 2017-02-03 LAB — TSH: TSH: 0.308 u[IU]/mL — ABNORMAL LOW (ref 0.350–4.500)

## 2017-02-03 LAB — BRAIN NATRIURETIC PEPTIDE: B NATRIURETIC PEPTIDE 5: 87.3 pg/mL (ref 0.0–100.0)

## 2017-02-03 MED ORDER — CLOPIDOGREL BISULFATE 75 MG PO TABS
75.0000 mg | ORAL_TABLET | Freq: Every day | ORAL | Status: DC
  Administered 2017-02-03 – 2017-02-06 (×4): 75 mg via ORAL
  Filled 2017-02-03 (×4): qty 1

## 2017-02-03 MED ORDER — MICONAZOLE NITRATE 2 % EX CREA
1.0000 "application " | TOPICAL_CREAM | Freq: Two times a day (BID) | CUTANEOUS | Status: DC
  Administered 2017-02-03 – 2017-02-06 (×7): 1 via TOPICAL
  Filled 2017-02-03: qty 14

## 2017-02-03 MED ORDER — RISAQUAD PO CAPS
1.0000 | ORAL_CAPSULE | Freq: Every day | ORAL | Status: DC
  Administered 2017-02-03 – 2017-02-06 (×4): 1 via ORAL
  Filled 2017-02-03 (×4): qty 1

## 2017-02-03 MED ORDER — SERTRALINE HCL 25 MG PO TABS
25.0000 mg | ORAL_TABLET | Freq: Every day | ORAL | Status: DC
  Administered 2017-02-03: 25 mg via ORAL
  Filled 2017-02-03: qty 1

## 2017-02-03 MED ORDER — LORAZEPAM 2 MG/ML IJ SOLN: 0.5000 mg | Freq: Once | INTRAMUSCULAR | Status: DC

## 2017-02-03 MED ORDER — ATORVASTATIN CALCIUM 20 MG PO TABS: 20.0000 mg | ORAL_TABLET | Freq: Every day | ORAL | Status: DC

## 2017-02-03 MED ORDER — LEVOTHYROXINE SODIUM 75 MCG PO TABS
150.0000 ug | ORAL_TABLET | Freq: Every day | ORAL | Status: DC
  Administered 2017-02-03 – 2017-02-06 (×4): 150 ug via ORAL
  Filled 2017-02-03 (×4): qty 2

## 2017-02-03 MED ORDER — NYSTATIN 100000 UNIT/GM EX CREA
1.0000 "application " | TOPICAL_CREAM | Freq: Two times a day (BID) | CUTANEOUS | Status: DC
  Administered 2017-02-03 – 2017-02-06 (×7): 1 via TOPICAL
  Filled 2017-02-03: qty 15

## 2017-02-03 NOTE — Progress Notes (Signed)
Called to bedside for change in GCS/NIHSS.  Patient evaluated by me, current GCS 9 is same as when I evaluated her in ER, except that she is now asleep and requires stimulation to awake.  Overall, suspect that this is purely metabolic encephalopathy, but will rule out recurrent CVA. She is well outside window for tPA, and so in absence of therapeutic options, will continue to monitor neuro checks.

## 2017-02-03 NOTE — Evaluation (Signed)
Physical Therapy Evaluation Patient Details Name: [name] MRN: [id number] DOB: August 22, 1931 Today's Date: 02/03/2017   History of Present Illness  Pt is an 83 y.o. female who presented to ED on 02/02/17 with 1 week confusion and slurred speech. PMH significant for advanced dementia, hx of CVA (most recently 04/2016), hypothyroidism, and indwelling foley because of dementia, depression, arthritis.   Clinical Impression  Pt presents with generalized weakness, decreased awareness, impaired balance, and an overall decrease in functional mobility secondary to above. Pt poor historian with increased confusion, but daughter present throughout session and able to provide details regarding PLOF. PTA, pt living in [place] unit at [facility], requiring assist to transfer to w/c and perform all ADLs. Today, pt required maxA +2 to perform bed mob and partial standing with RW; limited by multiple instances of bowel incontinence. Pt would benefit from continued acute PT to maximize functional mobility and decrease caregiver burden before d/c back to [facility].     Follow Up Recommendations Other (comment) (back to Memory Care unit at [facility])    Equipment Recommendations  None recommended by PT    Recommendations for Other Services       Precautions / Restrictions Precautions Precautions: Fall Restrictions Weight Bearing Restrictions: No      Mobility  Bed Mobility Overal bed mobility: Needs Assistance Bed Mobility: Sit to Supine;Rolling;Supine to Sit Rolling: Max assist   Supine to sit: Max assist Sit to supine: Max assist   General bed mobility comments: Increased time for bed mobility; requiring multimodal cues for movement initiation and maxA for trunk support and LE maneuver into sitting. Rolled x4 for pericare with maxA and use of bed rail.   Transfers Overall transfer level: Needs assistance Equipment used: Rolling walker (2 wheeled) Transfers: Sit to/from  Stand Sit to Stand: Max assist;+2 physical assistance         General transfer comment: Pt required multimodal cues to initiate standing, unable to achieve fully upright; partial standing x3 with RW and maxA +2 for pericare; LLE extension/abd with standing. MaxA for hand placement on RW.   Ambulation/Gait                Stairs            Wheelchair Mobility    Modified Rankin (Stroke Patients Only) Modified Rankin (Stroke Patients Only) Pre-Morbid Rankin Score: Severe disability Modified Rankin: Severe disability     Balance Overall balance assessment: Needs assistance Sitting-balance support: No upper extremity supported;Feet supported Sitting balance-Leahy Scale: Poor Sitting balance - Comments: Pt able to temporarily maintain sitting balance without support, ranging from min guard to maxA. Lateral/posterior lean requiring multimodal cues to achieve neutral posture, but unable to maintain consistently. Postural control: Posterior lean;Right lateral lean Standing balance support: Bilateral upper extremity supported;During functional activity Standing balance-Leahy Scale: Zero Standing balance comment: Unable to stand fully upright. Increased fear of falling                             Pertinent Vitals/Pain Pain Assessment: Faces Pain Score: 4  Pain Location: LLE Pain Descriptors / Indicators: Aching    Home Living Family/patient expects to be discharged to:: Skilled nursing facility                 Additional Comments: Pt resides in Memory Care Unit at [facility].     Prior Function Level of Independence: Needs assistance   Gait /  Transfers Assistance Needed: According to daughter, pt is w/c bound, requiring assist of 1-2 for squat pivot to chair.  ADL's / Homemaking Assistance Needed: Assist for all ADLs.         Hand Dominance        Extremity/Trunk Assessment   Upper Extremity Assessment Upper Extremity Assessment:  Generalized weakness    Lower Extremity Assessment Lower Extremity Assessment: Generalized weakness    Cervical / Trunk Assessment Cervical / Trunk Assessment: Other exceptions Cervical / Trunk Exceptions: Increased rigidity throughout trunk and BUE/BLE.   Communication   Communication: Expressive difficulties (Slurred speech with moments of clarity)  Cognition Arousal/Alertness: Awake/alert Behavior During Therapy: WFL for tasks assessed/performed Overall Cognitive Status: History of cognitive impairments - at baseline Area of Impairment: Attention;Memory;Following commands;Awareness;Problem solving   Current Attention Level: Sustained Memory: Decreased short-term memory Following Commands: Follows one step commands inconsistently;Follows one step commands with increased time;Follows multi-step commands inconsistently   Awareness: Intellectual Problem Solving: Slow processing;Decreased initiation;Difficulty sequencing;Requires verbal cues;Requires tactile cues General Comments: Slurred speech with intermittent moments of it being comprehensible, which made cognitive assessment difficulty.     General Comments General comments (skin integrity, edema, etc.): Stage II pressure sore noted on sacrum. Daughter present throughout; educ daughter regarding pressure relief and off-loading wound.     Exercises     Assessment/Plan    PT Assessment Patient needs continued PT services  PT Problem List Decreased strength;Decreased range of motion;Decreased mobility;Decreased cognition;Decreased activity tolerance;Decreased balance;Decreased knowledge of use of DME;Decreased skin integrity;Impaired tone       PT Treatment Interventions Therapeutic activities;Therapeutic exercise;Patient/family education;Balance training;Functional mobility training;Neuromuscular re-education    PT Goals (Current goals can be found in the Care Plan section)  Acute Rehab PT Goals Patient Stated Goal: Return  to [facility] PT Goal Formulation: With patient/family Time For Goal Achievement: 02/17/17 Potential to Achieve Goals: Good    Frequency Min 2X/week   Barriers to discharge        Co-evaluation               End of Session Equipment Utilized During Treatment: Gait belt Activity Tolerance: Other (comment) (Incontinence of bowel) Patient left: in bed;with nursing/sitter in room;with family/visitor present;with call bell/phone within reach Nurse Communication: Mobility status PT Visit Diagnosis: Muscle weakness (generalized) (M62.81);Difficulty in walking, not elsewhere classified (R26.2)         Time: 3545-6256 PT Time Calculation (min) (ACUTE ONLY): 58 min   Charges:   PT Evaluation $PT Eval Moderate Complexity: 1 Procedure PT Treatments $Therapeutic Exercise: 8-22 mins $Therapeutic Activity: 23-37 mins   PT G Codes:       Enis Gash, SPT Office-[phone]  Mabeline Caras 02/03/2017, 4:21 PM

## 2017-02-03 NOTE — Progress Notes (Signed)
Patient ID: [name], female   DOB: 09/04/1931, 83 y.o.   MRN: [id number]    PROGRESS NOTE    [name]  TIR:[id number] DOB: 1931/07/13 DOA: 02/02/2017  PCP: Dorothy Post, MD   Brief Narrative:  83 y.o. female with a past medical history significant for advanced dementia, hx of stroke (most recently 04/2016), hypothyroidism, and indwelling foley because of dementia, urinary retention who presented with 1 week confusion and slurred speech.   Patient at baseline is wheelchair bound for last year, but interactive, not oriented to year or place at baseline, lives in [facility] unit at [facility].    Assessment & Plan:   Acute metabolic encephalopathy  - from UTI, ? PNA (as suggested by CXR LLL) - no evidence of stroke on MRI but severe microvascular changes are noted  - continue ABX Ancef for now, IV as pt still not able to tale anything PO  - CT chest ordered for clearer evaluation - lower the rate of IVF as pt with some crackles on exam  - SLP requested as well  - pt is certainly at high risk for aspiration   Foley catheter associated UTI:  - review of records dating back to 2012 indicated recurrent UTI with multiple sp. Present E. Col, Enterbacter, Pseudomnoas, Klebsiella - urine culture from this admission still pending - follow up on results nad change ABX as clinically indicated   History of stroke:  - Continue Plavix  Hypothyroidism - Continue levothyroxine  SNF acquired pressure wound: - Consult WOC  DVT prophylaxis: Lovenox SQ Code Status: Full Family Communication: No family at bedside, daughter updated at bedside  Disposition Plan: To be determined   Consultants:   None  Procedures:   None  Antimicrobials:   Ancef 3/17 -->  Subjective: Remains confused.   Objective: Vitals:   02/03/17 0400 02/03/17 0600 02/03/17 0959 02/03/17 1000  BP: 134/61 118/79 (!) 120/59   Pulse: 71 65 75   Resp: 14 11    Temp: 98.3 F (36.8 C)    98.1 F (36.7 C)  TempSrc: Oral     SpO2: 95% 95% 96%   Weight:      Height:        Intake/Output Summary (Last 24 hours) at 02/03/17 1149 Last data filed at 02/03/17 0500  Gross per 24 hour  Intake             1000 ml  Output              300 ml  Net              700 ml   Filed Weights   02/02/17 2208  Weight: 61.2 kg (135 lb)    Examination:  General exam: Appears calm, confused, NAD Respiratory system: crackles at bases with no wheezing  Cardiovascular system: S1 & S2 heard, RRR. No rubs, gallops or clicks. No pedal edema. Gastrointestinal system: Abdomen is nondistended, soft and nontender. No organomegaly or masses felt.  Central nervous system: Alert, confused, not answering any questions   Data Reviewed: I have personally reviewed following labs and imaging studies  CBC:  Recent Labs Lab 02/02/17 1723 02/02/17 1745 02/03/17 0817  WBC 9.7  --  6.8  HGB 12.2 12.6 11.1*  HCT 38.1 37.0 33.9*  MCV 88.0  --  88.1  PLT 287  --  676   Basic Metabolic Panel:  Recent Labs Lab 02/02/17 1723 02/02/17 1745 02/03/17 0700  NA 139 140  140  K 3.9 4.1 4.1  CL 104 104 113*  CO2 25  --  21*  GLUCOSE 101* 98 93  BUN 11 15 8   CREATININE 0.54 0.50 0.47  CALCIUM 8.8*  --  8.0*   Liver Function Tests:  Recent Labs Lab 02/02/17 1723  AST 19  ALT 14  ALKPHOS 90  BILITOT 0.7  PROT 6.7  ALBUMIN 3.4*   Coagulation Profile:  Recent Labs Lab 02/02/17 1723  INR 1.06   CBG:  Recent Labs Lab 02/02/17 1713  GLUCAP 92   Lipid Profile:  Recent Labs  02/03/17 0700  CHOL 109  HDL 37*  LDLCALC 54  TRIG 89  CHOLHDL 2.9   Thyroid Function Tests:  Recent Labs  02/02/17 2350  TSH 0.308*   Urine analysis:    Component Value Date/Time   COLORURINE YELLOW 02/02/2017 1722   APPEARANCEUR TURBID (A) 02/02/2017 1722   LABSPEC 1.016 02/02/2017 1722   PHURINE 8.0 02/02/2017 1722   GLUCOSEU NEGATIVE 02/02/2017 1722   HGBUR MODERATE (A) 02/02/2017 1722    BILIRUBINUR NEGATIVE 02/02/2017 1722   BILIRUBINUR 1+ 01/05/2015 1145   KETONESUR 5 (A) 02/02/2017 1722   PROTEINUR 100 (A) 02/02/2017 1722   UROBILINOGEN 0.2 01/05/2015 1145   UROBILINOGEN 1.0 09/26/2014 1802   NITRITE NEGATIVE 02/02/2017 1722   LEUKOCYTESUR SMALL (A) 02/02/2017 1722   Radiology Studies: Ct Head Wo Contrast  Result Date: 02/02/2017 CLINICAL DATA:  83 year old female with history of altered mental status. Dementia. EXAM: CT HEAD WITHOUT CONTRAST TECHNIQUE: Contiguous axial images were obtained from the base of the skull through the vertex without intravenous contrast. COMPARISON:  Head CT 05/17/2016.  Brain MRI 05/17/2016. FINDINGS: Comment:  Study is slightly limited by considerable patient motion. Brain: Patchy and confluent areas of decreased attenuation are noted throughout the deep and periventricular white matter of the cerebral hemispheres bilaterally, compatible with severe chronic microvascular ischemic disease. No evidence of acute infarction, hemorrhage, hydrocephalus, extra-axial collection or mass lesion/mass effect. Vascular: No hyperdense vessel or unexpected calcification. Skull: Normal. Negative for fracture or focal lesion. Sinuses/Orbits: No acute finding. Other: None. IMPRESSION: 1. No acute intracranial abnormalities on today's motion limited examination. 2. Extensive chronic microvascular ischemic changes in the cerebral white matter, as above. Electronically Signed   By: [name] M.D.   On: 02/02/2017 18:35   Mr Brain Wo Contrast  Result Date: 02/03/2017 CLINICAL DATA:  Confusion and slurred speech EXAM: MRI HEAD WITHOUT CONTRAST TECHNIQUE: Multiplanar, multiecho pulse sequences of the brain and surrounding structures were obtained without intravenous contrast. COMPARISON:  Head CT 02/02/2017 Brain MRI 05/17/2016 FINDINGS: Brain: No focal diffusion restriction to indicate acute infarct. No intraparenchymal hemorrhage. There is diffuse confluent  hyperintense T2-weighted signal within the periventricular and deep white matter, most often seen in the setting of chronic microvascular ischemia. No mass lesion or midline shift. No hydrocephalus or extra-axial fluid collection. The midline structures are normal. No age advanced or lobar predominant atrophy. Vascular: Major intracranial arterial and venous sinus flow voids are preserved. No evidence of chronic microhemorrhage or amyloid angiopathy. Skull and upper cervical spine: The visualized skull base, calvarium, upper cervical spine and extracranial soft tissues are normal. Sinuses/Orbits: No fluid levels or advanced mucosal thickening. No mastoid effusion. Normal orbits. IMPRESSION: Severe chronic microvascular ischemia without acute intracranial abnormality. Electronically Signed   By: Ulyses Jarred M.D.   On: 02/03/2017 04:06   Dg Chest Port 1 View  Result Date: 02/02/2017 CLINICAL DATA:  Acute mental status  change. EXAM: PORTABLE CHEST 1 VIEW COMPARISON:  May 17, 2016 FINDINGS: No pneumothorax. The cardiomediastinal silhouette is stable with a tortuous thoracic aorta. Mild increased interstitial markings. Mild hazy opacity in left base. IMPRESSION: Suggested mild pulmonary venous congestion. Subtle but more focal opacity in the left base could represent than early infiltrate. Recommend follow-up to resolution. Electronically Signed   By: Dorise Bullion III M.D   On: 02/02/2017 17:58    Scheduled Meds: . acidophilus  1 capsule Oral Daily  . atorvastatin  20 mg Oral q1800  .  ceFAZolin (ANCEF) IV  1 g Intravenous Q8H  . clopidogrel  75 mg Oral Daily  . enoxaparin (LOVENOX) injection  40 mg Subcutaneous Q24H  . levothyroxine  150 mcg Oral QAC breakfast  . LORazepam  0.5 mg Intravenous Once  . miconazole  1 application Topical BID  . nystatin cream  1 application Topical BID  . sertraline  25 mg Oral Daily   Continuous Infusions: . sodium chloride 125 mL/hr at 02/02/17 2301     LOS: 0  days   Time spent: 20 minutes   Faye Ramsay, MD [facility] Pager [phone]  If 7PM-7AM, please contact night-coverage [email or web address] Password [facility] 02/03/2017, 11:49 AM

## 2017-02-03 NOTE — Progress Notes (Signed)
OT Cancellation Note  Patient Details Name: [name] MRN: [id number] DOB: Sep 08, 1931   Cancelled Treatment:    Reason Eval/Treat Not Completed: OT screened, no needs identified, will sign off. Per conversation with daughter and nurse at [facility]) pt close to total A with ADLs. 1-2 person assist to transfer to w/c at baseline. Per Bella Kennedy, "she needed quite a lot of help with everything." Will defer OT needs to next venue of care (plan is back to [facility]).  Tyrone Schimke OTR/L Pager: [phone]   02/03/2017, 2:27 PM

## 2017-02-03 NOTE — Progress Notes (Signed)
PT eval addendum- added G-codes    02/04/2017 1604  Subjective Data  Patient Stated Goal Return to [facility]  Acute Rehab PT Goals  PT Goal Formulation With patient/family  Time For Goal Achievement 02/17/17  Potential to Achieve Goals Good  PT Time Calculation  PT Start Time (ACUTE ONLY) 1438  PT Stop Time (ACUTE ONLY) 1536  PT Time Calculation (min) (ACUTE ONLY) 58 min  PT G-Codes **NOT FOR INPATIENT CLASS**  Functional Assessment Tool Used Clinical judgement  Functional Limitation Mobility: Walking and moving around  Mobility: Walking and Moving Around Current Status (B4496) CM  Mobility: Walking and Moving Around Goal Status (P5916) [place], PT, DPT [phone]

## 2017-02-03 NOTE — Progress Notes (Signed)
No unexpected events over night. Pt not interactive or participating in assessments

## 2017-02-03 NOTE — Care Management Obs Status (Signed)
[place] NOTIFICATION   Patient Details  Name: HIILEI GERST MRN: [id number] Date of Birth: [birth year]   Medicare Observation Status Notification Given:  Yes    Maryclare Labrador, RN 02/03/2017, 10:37 AM

## 2017-02-03 NOTE — Consult Note (Signed)
[place] Nurse wound consult note Reason for Consult: Stage 2 pressure injury to right buttocks,  Deep tissue injury to sacrum, present on admission.  Moisture associated skin damage to bilateral inner thighs.  Bilateral heels boggy with blanchable erythema.   Wound type:pressure and moisture Pressure Injury POA: Yes Measurement:Sacrum maroon intact nonblanchable redness 2 cm x 2 cm  Right buttocks 2 cm x 1.5 cm x 0.1 cm  Denuded linear breakdown to bilateral thigh/groin 0.2 cm depth  Pink and moist Wound BSJ:GGEZMO intact purple Right buttocks and groin pink and moist Drainage (amount, consistency, odor) scant serosanguinous noted Periwound:intact Dressing procedure/placement/frequency:Cleanse sacral wound and right buttocks wound with soap and water.  Allevyn silicone foam dressing.  Change every 3 days and PRN soilage.  Offload heel pressure by floating calves.  Will not follow at this time.  Please re-consult if needed.  Domenic Moras RN BSN [place] Pager [phone]

## 2017-02-03 NOTE — Care Management Note (Signed)
Case Management Note  Patient Details  Name: [name] MRN: [id number] Date of Birth: [birth year]  Subjective/Objective:    Pt admitted with confusion                Action/Plan:   PTA from [facility] for approximately 1 year in the memory care ward.  Daughter at bedside and provided information.  CSW consulted - discharge plan per daughter is to discharge back to facility.  CM will continue to follow for discharge   Expected Discharge Date:                  Expected Discharge Plan:   (from [facility])  In-House Referral:  Clinical Social Work  Discharge planning Services  CM Consult  Post Acute Care Choice:    Choice offered to:     DME Arranged:    DME Agency:     HH Arranged:    [place] Agency:     Status of Service:  In process, will continue to follow  If discussed at Long Length of Stay Meetings, dates discussed:    Additional Comments:  Maryclare Labrador, RN 02/03/2017, 10:32 AM

## 2017-02-03 NOTE — Progress Notes (Signed)
PT Cancellation Note  Patient Details Name: [name] MRN: [id number] DOB: 1931/02/25   Cancelled Treatment:    Reason Eval/Treat Not Completed: Patient at procedure or test/unavailable, will follow up for PT eval as time permits. Per RN, pt lethargic and not arousable this morning.   Enis Gash, SPT Office-[phone]  Mabeline Caras 02/03/2017, 11:11 AM

## 2017-02-04 ENCOUNTER — Inpatient Hospital Stay (HOSPITAL_COMMUNITY): Payer: Medicare HMO

## 2017-02-04 LAB — URINE CULTURE

## 2017-02-04 LAB — BASIC METABOLIC PANEL
Anion gap: 4 — ABNORMAL LOW (ref 5–15)
BUN: <5 mg/dL — ABNORMAL LOW (ref 6–20)
CHLORIDE: 113 mmol/L — AB (ref 101–111)
CO2: 23 mmol/L (ref 22–32)
CREATININE: 0.49 mg/dL (ref 0.44–1.00)
Calcium: 8 mg/dL — ABNORMAL LOW (ref 8.9–10.3)
GFR calc Af Amer: >60 mL/min (ref >60)
GFR calc non Af Amer: >60 mL/min (ref >60)
GLUCOSE: 86 mg/dL (ref 65–99)
Potassium: 3.1 mmol/L — ABNORMAL LOW (ref 3.5–5.1)
Sodium: 140 mmol/L (ref 135–145)

## 2017-02-04 LAB — CBC
HEMATOCRIT: 31.6 % — AB (ref 36.0–46.0)
HEMOGLOBIN: 10 g/dL — AB (ref 12.0–15.0)
MCH: 27.8 pg (ref 26.0–34.0)
MCHC: 31.6 g/dL (ref 30.0–36.0)
MCV: 87.8 fL (ref 78.0–100.0)
Platelets: 252 10*3/uL (ref 150–400)
RBC: 3.6 MIL/uL — ABNORMAL LOW (ref 3.87–5.11)
RDW: 15 % (ref 11.5–15.5)
WBC: 6.5 10*3/uL (ref 4.0–10.5)

## 2017-02-04 LAB — MRSA PCR SCREENING: MRSA by PCR: POSITIVE — AB

## 2017-02-04 MED ORDER — POTASSIUM CHLORIDE CRYS ER 20 MEQ PO TBCR
20.0000 meq | EXTENDED_RELEASE_TABLET | Freq: Once | ORAL | Status: AC
  Administered 2017-02-04: 20 meq via ORAL
  Filled 2017-02-04: qty 1

## 2017-02-04 MED ORDER — MUPIROCIN 2 % EX OINT
1.0000 "application " | TOPICAL_OINTMENT | Freq: Two times a day (BID) | CUTANEOUS | Status: DC
  Administered 2017-02-04 – 2017-02-06 (×5): 1 via NASAL
  Filled 2017-02-04 (×2): qty 22

## 2017-02-04 MED ORDER — POTASSIUM CHLORIDE CRYS ER 20 MEQ PO TBCR
40.0000 meq | EXTENDED_RELEASE_TABLET | Freq: Once | ORAL | Status: AC
  Administered 2017-02-04: 40 meq via ORAL
  Filled 2017-02-04: qty 2

## 2017-02-04 MED ORDER — CHLORHEXIDINE GLUCONATE CLOTH 2 % EX PADS
6.0000 | MEDICATED_PAD | Freq: Every day | CUTANEOUS | Status: DC
  Administered 2017-02-05 – 2017-02-06 (×2): 6 via TOPICAL

## 2017-02-04 NOTE — Evaluation (Signed)
Speech Language Pathology Evaluation Patient Details Name: [name] MRN: [id number] DOB: 1931-06-27 Today's Date: 02/04/2017 Time: 1010-1031 SLP Time Calculation (min) (ACUTE ONLY): 21 min  Problem List:  Patient Active Problem List   Diagnosis Date Noted  . Pressure injury of skin 02/03/2017  . Confusion 02/02/2017  . CVA (cerebral vascular accident) ([place]) 05/18/2016  . B12 deficiency 05/18/2016  . Encephalopathy acute 05/17/2016  . Encephalopathy 05/17/2016  . Acute UTI 05/17/2016  . Leukocytosis 05/17/2016  . Dehydration 05/17/2016  . H/O: CVA (cerebrovascular accident) 05/17/2016  . UTI (urinary tract infection) due to urinary indwelling Foley catheter ([place]) 05/17/2016  . Acute encephalopathy 05/17/2016  . Postoperative anemia due to acute blood loss 09/28/2014  . Urinary retention 09/28/2014  . Infection of urinary tract 09/28/2014  . Severe dementia 09/28/2014  . Left displaced femoral neck fracture ([place]) 09/23/2014  . Vascular dementia 09/17/2014  . 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 Essential hypertension, benign 07/22/2013  . Acute posthemorrhagic anemia 07/22/2013  . Anemia 07/03/2013  . Constipation 07/03/2013  . Insomnia 07/03/2013  . Recurrent UTI 03/27/2013  . Preoperative evaluation to rule out surgical contraindication 12/24/2012  . Cognitive impairment 04/02/2012  . Osteoarthritis of knee 03/12/2011  . Hypothyroid 02/26/2011  . TIA (transient ischemic attack) 02/26/2011  . Depression 02/26/2011  . Hyperlipemia 02/26/2011  . Alcohol abuse 02/26/2011   Past Medical History:  Past Medical History:  Diagnosis Date  . Alcohol abuse   . Arthritis   . Depression   . Stroke ([place])   . Thyroid disease    hypothyroid   Past Surgical History:  Past Surgical History:  Procedure Laterality Date  . ABDOMINAL HYSTERECTOMY     partial  . CHOLECYSTECTOMY  2008  . THYROID SURGERY    . TONSILLECTOMY AND ADENOIDECTOMY    . TOTAL HIP ARTHROPLASTY  Left 09/24/2014   Procedure: TOTAL HIP ARTHROPLASTY ANTERIOR APPROACH, LEFT;  Surgeon: Mcarthur Rossetti, MD;  Location: [facility] ORS;  Service: Orthopedics;  Laterality: Left;   HPI:  [name] a 83 y.o.femalewith a PMH significant for advanced dementia, hx of stroke (most recently 04/2016), urinary retention, and hypothyroidism who presents with 1 week confusion and slurred speech. Per family, about 2 weeks ago, she started to develop a foul smell in her room like urine, complained to her daughter about pain at her foley. CXR showed no active disease. Chronic interstitial lung markings. Aortic atherosclerosis. Area questioned in the left lower lobe on 3/17 looks clear today (3/18). MRI of brain showed severe chronic microvascular ischemia without acute intracranial abnormality.   Assessment / Plan / Recommendation Clinical Impression  Ms. Bucio presents with advanced dementia excerbated by UTI. Daughter reports that pt's slurred speech and mentation has improved today, but that she is still not functioning at her baseline. A standardized cognitive/language evaluation was not administered due to severity of deficits and dementia; SLP assessed language, speech and cognition informally. Oral motor assessment revealed dry oral cavity and moderate weakness with left facial asymmetry and mild decreased lingual strength and ROM. SLP attempted to provide oral care, however pt's difficulty opening oral cavity hindered ability to clean thoroughly. Ms. Vanacker was able to follow simple 1-step commands and was able to accurately report her name, DOB, and her daughter's name. Overall speech intelligibility was reduced at the word, phrase, sentence, and conversation level with low vocal intensity noted. Daughter's goal for her mother is to communicate effectively (speech intelligibility) and safely take medications. Suspect that speech and cognition will  return to baseline as symptoms from UTI improve. ST will follow up  briefly for trial treatment targeting speech intelligibility and cognitive therapy (orientation and functional tasks).    SLP Assessment  SLP Recommendation/Assessment: Patient needs continued Speech Lanaguage Pathology Services SLP Visit Diagnosis: Dysarthria and anarthria (R47.1);Cognitive communication deficit (R41.841)    Follow Up Recommendations  Skilled Nursing facility    Frequency and Duration min 1 x/week  1 week      SLP Evaluation Cognition  Overall Cognitive Status: Impaired/Different from baseline (also cognitive impairments at baseline-dementia) Arousal/Alertness: Awake/alert Orientation Level: Oriented to person;Disoriented to place;Disoriented to time;Disoriented to situation Attention: Sustained Sustained Attention: Impaired Sustained Attention Impairment: Verbal basic Memory: Impaired Memory Impairment: Other (comment) (advanced dementia) Awareness: Impaired Awareness Impairment: Intellectual impairment;Emergent impairment;Anticipatory impairment Problem Solving: Impaired Problem Solving Impairment: Verbal basic;Functional basic Safety/Judgment: Impaired       Comprehension  Auditory Comprehension Overall Auditory Comprehension: Appears within functional limits for tasks assessed Yes/No Questions: Not tested Commands: Impaired [facility] for simple 1-step commands) Conversation: Simple Interfering Components: Processing speed;Working Marine scientist;Attention Visual Recognition/Discrimination Discrimination: Not tested Reading Comprehension Reading Status: Not tested    Expression Expression Primary Mode of Expression: Verbal Verbal Expression Overall Verbal Expression: Appears within functional limits for tasks assessed Initiation: No impairment Level of Generative/Spontaneous Verbalization: Sentence Repetition:  (not tested) Naming: Not tested Written Expression Written Expression: Not tested   Oral / Motor  Oral Motor/Sensory Function Overall Oral  Motor/Sensory Function: Moderate impairment Facial ROM: Within Functional Limits Facial Symmetry: Abnormal symmetry left Facial Strength: Reduced right;Reduced left Lingual ROM: Reduced right;Reduced left Lingual Symmetry: Within Functional Limits Lingual Strength: Reduced [place]um: Within Functional Limits Motor Speech Overall Motor Speech: Impaired Respiration: Impaired Level of Impairment: Sentence Phonation: Low vocal intensity Resonance: Within functional limits Articulation: Impaired Level of Impairment: Phrase Intelligibility: Intelligibility reduced Word: 50-74% accurate Phrase: 25-49% accurate Sentence: 25-49% accurate Conversation: 0-24% accurate Motor Planning: Not tested Motor Speech Errors: Not applicable   GO                    Fransisca Kaufmann , Student-SLP 02/04/2017, 1:43 PM

## 2017-02-04 NOTE — Progress Notes (Signed)
Patient ID: [name], female   DOB: 01-22-1931, 83 y.o.   MRN: [id number]    PROGRESS NOTE  [name]  STM:[id number] DOB: 1931/03/23 DOA: 02/02/2017  PCP: [name], MD   Brief Narrative:  83 y.o. female with a past medical history significant for advanced dementia, hx of stroke (most recently 04/2016), hypothyroidism, and indwelling foley because of dementia, urinary retention who presented with 1 week confusion and slurred speech.   Patient at baseline is wheelchair bound for last year, but interactive, not oriented to year or place at baseline, lives in [place] unit at [facility].    Assessment & Plan:   Acute metabolic encephalopathy, slurred speech in pt with known hx of stroke  - from UTI, no signs of PNA on CT chest  - no evidence of stroke on MRI but severe microvascular changes are noted  - spoke with neurology on call, pt still with slurred speech and neurologist recommended treat UTI completely and if this persists re consult but for now neurological interventions needed  - continue ABX Ancef for now, IV as pt still not able to tale much PO - SLP requested as well  - pt is certainly at high risk for aspiration   Foley catheter associated UTI:  - review of records dating back to 2012 indicated recurrent UTI with multiple sp. Present E. Col, Enterbacter, Pseudomnoas, Klebsiella - urine culture from this admission still pending - follow up on results nad change ABX as clinically indicated   History of stroke:  - Continue Plavix  Hypokalemia - supplemented and will repeat BMP in AM  Hypothyroidism - Continue levothyroxine  SNF acquired pressure wound: - Consult WOC  DVT prophylaxis: Lovenox SQ Code Status: Full Family Communication: daughter updated at bedside  Disposition Plan: To be determined   Consultants:   None  Procedures:   None  Antimicrobials:   Ancef 3/17 -->  Subjective: Remains confused.   Objective: Vitals:    02/03/17 1619 02/03/17 2309 02/04/17 0237 02/04/17 0522  BP: 139/65 140/72 (!) 143/91 (!) 133/54  Pulse: 80 79 90 83  Resp: 20 12 13 18   Temp: 98.2 F (36.8 C) 98 F (36.7 C) 98.4 F (36.9 C) 98.8 F (37.1 C)  TempSrc: Oral Oral Oral Oral  SpO2: 96%  96% 94%  Weight:      Height:        Intake/Output Summary (Last 24 hours) at 02/04/17 1301 Last data filed at 02/04/17 0959  Gross per 24 hour  Intake          2602.08 ml  Output             1250 ml  Net          1352.08 ml   Filed Weights   02/02/17 2208  Weight: 61.2 kg (135 lb)    Examination:  General exam: Appears calm, confused, NAD Respiratory system: crackles at bases with no wheezing  Cardiovascular system: S1 & S2 heard, RRR. No rubs, gallops or clicks. No pedal edema. Gastrointestinal system: Abdomen is nondistended, soft and nontender. No organomegaly or masses felt.  Central nervous system: Alert, confused, not answering any questions, slurred speech, not able to move LLE says its too painful   Data Reviewed: I have personally reviewed following labs and imaging studies  CBC:  Recent Labs Lab 02/02/17 1723 02/02/17 1745 02/03/17 0817 02/04/17 0459  WBC 9.7  --  6.8 6.5  HGB 12.2 12.6 11.1* 10.0*  HCT  38.1 37.0 33.9* 31.6*  MCV 88.0  --  88.1 87.8  PLT 287  --  255 478   Basic Metabolic Panel:  Recent Labs Lab 02/02/17 1723 02/02/17 1745 02/03/17 0700 02/04/17 0459  NA 139 140 140 140  K 3.9 4.1 4.1 3.1*  CL 104 104 113* 113*  CO2 25  --  21* 23  GLUCOSE 101* 98 93 86  BUN 11 15 8  <5*  CREATININE 0.54 0.50 0.47 0.49  CALCIUM 8.8*  --  8.0* 8.0*   Liver Function Tests:  Recent Labs Lab 02/02/17 1723  AST 19  ALT 14  ALKPHOS 90  BILITOT 0.7  PROT 6.7  ALBUMIN 3.4*   Coagulation Profile:  Recent Labs Lab 02/02/17 1723  INR 1.06   CBG:  Recent Labs Lab 02/02/17 1713  GLUCAP 92   Lipid Profile:  Recent Labs  02/03/17 0700  CHOL 109  HDL 37*  LDLCALC 54  TRIG  89  CHOLHDL 2.9   Thyroid Function Tests:  Recent Labs  02/02/17 2350  TSH 0.308*   Urine analysis:    Component Value Date/Time   COLORURINE YELLOW 02/02/2017 1722   APPEARANCEUR TURBID (A) 02/02/2017 1722   LABSPEC 1.016 02/02/2017 1722   PHURINE 8.0 02/02/2017 1722   GLUCOSEU NEGATIVE 02/02/2017 1722   HGBUR MODERATE (A) 02/02/2017 1722   BILIRUBINUR NEGATIVE 02/02/2017 1722   BILIRUBINUR 1+ 01/05/2015 1145   KETONESUR 5 (A) 02/02/2017 1722   PROTEINUR 100 (A) 02/02/2017 1722   UROBILINOGEN 0.2 01/05/2015 1145   UROBILINOGEN 1.0 09/26/2014 1802   NITRITE NEGATIVE 02/02/2017 1722   LEUKOCYTESUR SMALL (A) 02/02/2017 1722   Radiology Studies: Dg Chest 2 View  Result Date: 02/03/2017 CLINICAL DATA:  Followup pulmonary infiltrate EXAM: CHEST  2 VIEW COMPARISON:  02/02/2017.  05/17/2016. FINDINGS: Heart size is normal. There is aortic atherosclerosis. There chronic interstitial lung markings but no sign of active infiltrate, mass, effusion or collapse. Chronic degenerative changes affect the spine. IMPRESSION: No active disease. Chronic interstitial lung markings. Aortic atherosclerosis. Area questioned in the left lower lobe yesterday looks clear today. Electronically Signed   By: Nelson Chimes M.D.   On: 02/03/2017 12:55   Ct Head Wo Contrast  Result Date: 02/02/2017 CLINICAL DATA:  83 year old female with history of altered mental status. Dementia. EXAM: CT HEAD WITHOUT CONTRAST TECHNIQUE: Contiguous axial images were obtained from the base of the skull through the vertex without intravenous contrast. COMPARISON:  Head CT 05/17/2016.  Brain MRI 05/17/2016. FINDINGS: Comment:  Study is slightly limited by considerable patient motion. Brain: Patchy and confluent areas of decreased attenuation are noted throughout the deep and periventricular white matter of the cerebral hemispheres bilaterally, compatible with severe chronic microvascular ischemic disease. No evidence of acute  infarction, hemorrhage, hydrocephalus, extra-axial collection or mass lesion/mass effect. Vascular: No hyperdense vessel or unexpected calcification. Skull: Normal. Negative for fracture or focal lesion. Sinuses/Orbits: No acute finding. Other: None. IMPRESSION: 1. No acute intracranial abnormalities on today's motion limited examination. 2. Extensive chronic microvascular ischemic changes in the cerebral white matter, as above. Electronically Signed   By: Vinnie Langton M.D.   On: 02/02/2017 18:35   Ct Chest Wo Contrast  Result Date: 02/04/2017 CLINICAL DATA:  Shortness of Breath EXAM: CT CHEST WITHOUT CONTRAST TECHNIQUE: Multidetector CT imaging of the chest was performed following the standard protocol without IV contrast. COMPARISON:  Chest x-ray 02/03/2017 FINDINGS: Cardiovascular: Heart is normal size. Dense diffuse coronary artery calcifications. Moderate aortic calcifications. No evidence  of aneurysm. Mediastinum/Nodes: No mediastinal, hilar, or axillary adenopathy. Trachea and esophagus unremarkable. Lungs/Pleura: Dependent opacities in the lower lungs bilaterally, likely dependent atelectasis. No visible effusions. Upper Abdomen: Prior cholecystectomy. Nonobstructing stone in the upper pole of the right kidney is partially imaged. Aortic calcifications. Musculoskeletal: Chest wall soft tissues are unremarkable. No acute bony abnormality. Degenerative changes throughout the thoracic spine. IMPRESSION: Dependent atelectasis in the lower lobes bilaterally. Advanced coronary artery disease, aortic atherosclerosis. No acute cardiopulmonary disease. Prior cholecystectomy. Electronically Signed   By: Rolm Baptise M.D.   On: 02/04/2017 08:52   Mr Brain Wo Contrast  Result Date: 02/03/2017 CLINICAL DATA:  Confusion and slurred speech EXAM: MRI HEAD WITHOUT CONTRAST TECHNIQUE: Multiplanar, multiecho pulse sequences of the brain and surrounding structures were obtained without intravenous contrast.  COMPARISON:  Head CT 02/02/2017 Brain MRI 05/17/2016 FINDINGS: Brain: No focal diffusion restriction to indicate acute infarct. No intraparenchymal hemorrhage. There is diffuse confluent hyperintense T2-weighted signal within the periventricular and deep white matter, most often seen in the setting of chronic microvascular ischemia. No mass lesion or midline shift. No hydrocephalus or extra-axial fluid collection. The midline structures are normal. No age advanced or lobar predominant atrophy. Vascular: Major intracranial arterial and venous sinus flow voids are preserved. No evidence of chronic microhemorrhage or amyloid angiopathy. Skull and upper cervical spine: The visualized skull base, calvarium, upper cervical spine and extracranial soft tissues are normal. Sinuses/Orbits: No fluid levels or advanced mucosal thickening. No mastoid effusion. Normal orbits. IMPRESSION: Severe chronic microvascular ischemia without acute intracranial abnormality. Electronically Signed   By: Ulyses Jarred M.D.   On: 02/03/2017 04:06   Dg Chest Port 1 View  Result Date: 02/02/2017 CLINICAL DATA:  Acute mental status change. EXAM: PORTABLE CHEST 1 VIEW COMPARISON:  May 17, 2016 FINDINGS: No pneumothorax. The cardiomediastinal silhouette is stable with a tortuous thoracic aorta. Mild increased interstitial markings. Mild hazy opacity in left base. IMPRESSION: Suggested mild pulmonary venous congestion. Subtle but more focal opacity in the left base could represent than early infiltrate. Recommend follow-up to resolution. Electronically Signed   By: Dorise Bullion III M.D   On: 02/02/2017 17:58    Scheduled Meds: . acidophilus  1 capsule Oral Daily  .  ceFAZolin (ANCEF) IV  1 g Intravenous Q8H  . clopidogrel  75 mg Oral Daily  . enoxaparin (LOVENOX) injection  40 mg Subcutaneous Q24H  . levothyroxine  150 mcg Oral QAC breakfast  . LORazepam  0.5 mg Intravenous Once  . miconazole  1 application Topical BID  . nystatin  cream  1 application Topical BID  . potassium chloride  20 mEq Oral Once   Continuous Infusions:    LOS: 1 day   Time spent: 20 minutes   Faye Ramsay, MD [facility] Pager [phone]  If 7PM-7AM, please contact night-coverage [email or web address] Password [email or web address] 02/04/2017, 1:01 PM

## 2017-02-04 NOTE — Progress Notes (Signed)
Physical Therapy Treatment Patient Details Name: [name] MRN: [id number] DOB: 03/29/1931 Today's Date: 02/04/2017    History of Present Illness Pt is an 83 y.o. female who presented to ED on 02/02/17 with 1 week confusion and slurred speech. PMH significant for advanced dementia, hx of CVA (most recently 04/2016), hypothyroidism, and indwelling foley because of dementia, depression, arthritis.     PT Comments    Pt performed increased activity and sat edge of bed x 15 min with min A-max A for support.  Pt performed bouts of dynamic reaching and weight shifting forward and left.  Pt presents with R lateral lean.  Pt observed pin rolling with L hand.  B LEs are tight during mobility.  Pt fearful of movement but as progressed she began to trust therapist.  Pt presents with vision deficits.  Will inform OT to follow up.      Follow Up Recommendations  Other (comment) (back to memory care unit at [facility])     Equipment Recommendations  None recommended by PT    Recommendations for Other Services       Precautions / Restrictions Precautions Precautions: Fall Restrictions Weight Bearing Restrictions: No    Mobility  Bed Mobility Overal bed mobility: Needs Assistance Bed Mobility: Supine to Sit;Sit to Supine     Supine to sit: Max assist;+2 for physical assistance Sit to supine: Max assist;+2 for physical assistance   General bed mobility comments: Increased time for bed mobility; requiring multimodal cues for movement initiation and maxA for trunk support and LE maneuver into sitting.  Assist required to return to seated position.    Transfers Overall transfer level:  (not performed.  )                  Ambulation/Gait                 Stairs            Wheelchair Mobility    Modified Rankin (Stroke Patients Only)       Balance Overall balance assessment: Needs assistance Sitting-balance support: No upper extremity supported;Feet  supported Sitting balance-Leahy Scale: Poor Sitting balance - Comments: Pt able to temporarily maintain sitting balance without support, ranging from min guard to maxA. Lateral/posterior lean requiring multimodal cues to achieve neutral posture, but unable to maintain consistently.                            Cognition Arousal/Alertness: Awake/alert Behavior During Therapy: WFL for tasks assessed/performed Overall Cognitive Status: Impaired/Different from baseline (baseline dementia) Area of Impairment: Attention;Memory;Following commands;Awareness;Problem solving   Current Attention Level: Sustained Memory: Decreased short-term memory Following Commands: Follows one step commands inconsistently;Follows one step commands with increased time;Follows multi-step commands inconsistently   Awareness: Intellectual Problem Solving: Slow processing;Decreased initiation;Difficulty sequencing;Requires verbal cues;Requires tactile cues General Comments: Slurred speech with intermittent moments of it being comprehensible, which made cognitive assessment difficulty.     Exercises      General Comments        Pertinent Vitals/Pain Pain Assessment: Faces Faces Pain Scale: Hurts a little bit Pain Location: LLE Pain Descriptors / Indicators: Aching    Home Living                      Prior Function            PT Goals (current goals can now be found in the care plan  section) Acute Rehab PT Goals Patient Stated Goal: Return to [facility] Potential to Achieve Goals: Good Progress towards PT goals: Progressing toward goals    Frequency    Min 2X/week      PT Plan Current plan remains appropriate    Co-evaluation             End of Session   Activity Tolerance: Patient tolerated treatment well;Other (comment) (limited due to dementia) Patient left: in bed;with nursing/sitter in room;with family/visitor present;with call bell/phone within reach Nurse  Communication: Mobility status PT Visit Diagnosis: Muscle weakness (generalized) (M62.81);Difficulty in walking, not elsewhere classified (R26.2)     Time: 5009-3818 PT Time Calculation (min) (ACUTE ONLY): 45 min  Charges:  $Therapeutic Activity: 23-37 mins $Neuromuscular Re-education: 8-22 mins                    G Codes:       Cristela Blue 02/08/17, 4:56 PM Governor Rooks, PTA pager [phone]

## 2017-02-05 LAB — CBC
HCT: 35.4 % — ABNORMAL LOW (ref 36.0–46.0)
HEMOGLOBIN: 11.3 g/dL — AB (ref 12.0–15.0)
MCH: 27.6 pg (ref 26.0–34.0)
MCHC: 31.9 g/dL (ref 30.0–36.0)
MCV: 86.6 fL (ref 78.0–100.0)
PLATELETS: 258 10*3/uL (ref 150–400)
RBC: 4.09 MIL/uL (ref 3.87–5.11)
RDW: 14.6 % (ref 11.5–15.5)
WBC: 6.5 10*3/uL (ref 4.0–10.5)

## 2017-02-05 LAB — BASIC METABOLIC PANEL
ANION GAP: 10 (ref 5–15)
CHLORIDE: 106 mmol/L (ref 101–111)
CO2: 25 mmol/L (ref 22–32)
Calcium: 8.9 mg/dL (ref 8.9–10.3)
Creatinine, Ser: 0.52 mg/dL (ref 0.44–1.00)
Glucose, Bld: 110 mg/dL — ABNORMAL HIGH (ref 65–99)
POTASSIUM: 3.9 mmol/L (ref 3.5–5.1)
SODIUM: 141 mmol/L (ref 135–145)

## 2017-02-05 MED ORDER — ENSURE ENLIVE PO LIQD
237.0000 mL | Freq: Two times a day (BID) | ORAL | Status: DC
  Administered 2017-02-06: 237 mL via ORAL

## 2017-02-05 MED ORDER — PRO-STAT SUGAR FREE PO LIQD
30.0000 mL | Freq: Every day | ORAL | Status: DC
  Administered 2017-02-05: 30 mL via ORAL
  Filled 2017-02-05: qty 30

## 2017-02-05 MED ORDER — ADULT MULTIVITAMIN W/MINERALS CH
1.0000 | ORAL_TABLET | Freq: Every day | ORAL | Status: DC
  Administered 2017-02-05 – 2017-02-06 (×2): 1 via ORAL
  Filled 2017-02-05 (×2): qty 1

## 2017-02-05 MED ORDER — CEPHALEXIN 500 MG PO CAPS
500.0000 mg | ORAL_CAPSULE | Freq: Two times a day (BID) | ORAL | Status: DC
  Administered 2017-02-05 – 2017-02-06 (×2): 500 mg via ORAL
  Filled 2017-02-05 (×2): qty 1

## 2017-02-05 NOTE — Clinical Social Work Note (Signed)
Clinical Social Work Assessment  Patient Details  Name: [name] MRN: [id number] Date of Birth: [birth year]  Date of referral:  02/05/17               Reason for consult:  Discharge Planning                Permission sought to share information with:  Facility Sport and exercise psychologist, Family Supports Permission granted to share information::  Yes, Verbal Permission Granted  Name::     [name]  Agency::  [facility]  Relationship::  Daughters  Contact Information:  [phone]  Housing/Transportation Living arrangements for the past 2 months:  [place] of Information:  Adult Children Patient Interpreter Needed:  None Criminal Activity/Legal Involvement Pertinent to Current Situation/Hospitalization:  No - Comment as needed Significant Relationships:  Adult Children Lives with:  Facility Resident Do you feel safe going back to the place where you live?  Yes Need for family participation in patient care:  Yes (Comment)  Care giving concerns:  CSW received consult regarding discharge planning. Patient is disoriented. CSW spoke with patient's financial POA, [name]. Patient resides at [facility] and will return there at discharge. CSW to continue to follow and assist with discharge planning needs.   Social Worker assessment / plan:  CSW spoke with patient's daughter concerning return to memory care.   Employment status:  Retired Nurse, adult PT Recommendations:  Not assessed at this time Information / Referral to community resources:  Other (Comment Required) (N/A)  Patient/Family's Response to care:  Patient's daughter expresses agreement with discharge plan and would like for patient to transport using a wheelchair van.   Patient/Family's Understanding of and Emotional Response to Diagnosis, Current Treatment, and Prognosis:  Patient's daughter expressed understanding of CSW role and discharge process. She  reports being hopeful for discharge soon. No questions/concerns about plan or treatment.    Emotional Assessment Appearance:  Appears stated age Attitude/Demeanor/Rapport:  Unable to Assess Affect (typically observed):  Unable to Assess Orientation:  Oriented to Self Alcohol / Substance use:  Not Applicable Psych involvement (Current and /or in the community):  No (Comment)  Discharge Needs  Concerns to be addressed:  Care Coordination Readmission within the last 30 days:  No Current discharge risk:  None Barriers to Discharge:  Continued Medical Work up   [employer], [place] 02/05/2017, 9:16 AM

## 2017-02-05 NOTE — Consult Note (Signed)
[facility] CM Primary Care Navigator  02/05/2017  [name] 02/21/1931 [id number]   Met with patient and daughter [name]) at the bedside to identify possible discharge needs. Patient's daughter reports that UTI symptoms with confusion had led to this admission. Daughter states that patient resides in the Memory Care unit at [facility] for a year and her care needs had been provided by facility staff including administering of her medications     Patient had Dr. Levin Bacon with [facility] [place] as the primary care provider prior to living in [facility], but is now being seen by the facility physician per daughter.   Plan for discharge is to go back to the Memory Care unit at [facility] when medically stable.  Daughter denies any other needs or concerns at his time.   For questions, please contact:  Dannielle Huh, BSN, RN- [facility] Primary Care Navigator  Telephone: [phone] [place]

## 2017-02-05 NOTE — Progress Notes (Signed)
Initial Nutrition Assessment  DOCUMENTATION CODES:   Not applicable  INTERVENTION:   -Ensure Enlive po BID, each supplement provides 350 kcal and 20 grams of protein -30 ml Prostat daily, each supplement provides 100 kcals and 15 grams protein -MVI daily  NUTRITION DIAGNOSIS:   Increased nutrient needs related to wound healing as evidenced by estimated needs.  GOAL:   Patient will meet greater than or equal to 90% of their needs  MONITOR:   PO intake, Supplement acceptance, Labs, Weight trends, Skin, I & O's  REASON FOR ASSESSMENT:   Rounds    ASSESSMENT:   83 y.o. female with a past medical history significant for advanced dementia, hx of stroke (most recently 04/2016), hypothyroidism, and indwelling foley because of dementia, urinary retention who presented with 1 week confusion and slurred speech.   RN requested RD evaluate pt, due to pt and family requesting Ensure supplements secondary to poor appetite.   Pt admitted with confusion secondary to UTI. Pt is a resident of [facility] unit.   Reviewed CWOCN note from 02/03/17; pt with stage II pressure injury to rt buttocks and DPTI to sacrum.   Pt being evaluated by IV team at time of visit. No family members present at time of visit. Unable to complete Nutrition-Focused physical exam at this time, however, pt appears very thin and frail appearing.   Observed breakfast and lunch trays; noted minimal intake, just a few bites of each tray (less than 25% meal completion).   Reviewed wt hx; noted UBW is around 110#. Unsure if wt of 135# obtain during admission is accurate. Will use previous wt of 113# (taken on 05/17/17) to estimate needs.   Unable to identify malnutrition at this time, however, pt is at high risk given poor oral intake, advanced age, and pressure injuries. RD will add supplements to assist with wound healing and attaining nutritional adequacy.   Labs reviewed.   Diet Order:  DIET SOFT Room  service appropriate? Yes; Fluid consistency: Thin  Skin:  Wound (see comment) (DTPI sacrum, st II rt buttocks)  Last BM:  02/05/17  Height:   Ht Readings from Last 1 Encounters:  02/02/17 5\' 2"  (1.575 m)    Weight:   Wt Readings from Last 1 Encounters:  02/02/17 135 lb (61.2 kg)    Ideal Body Weight:  50 kg  BMI:  Body mass index is 24.69 kg/m.  Estimated Nutritional Needs:   Kcal:  1500-1700  Protein:  65-80 grams  Fluid:  >1.5 L  EDUCATION NEEDS:   Education needs no appropriate at this time  Jariah Tarkowski A. Jimmye Norman, RD, LDN, CDE Pager: [phone] After hours Pager: [phone]

## 2017-02-05 NOTE — Progress Notes (Addendum)
Patient ID: ALEICIA KENAGY, female   DOB: April 23, 1931, 83 y.o.   MRN: [id number]   PROGRESS NOTE  [name]  UYQ:[id number] DOB: 1931/10/26 DOA: 02/02/2017  PCP: [name], MD   Brief Narrative:  [name] is a 83 y.o. female with a history of advanced dementia, stroke (most recently 04/2016), hypothyroidism, and indwelling foley urinary retention who presented with 1 week confusion and slurred speech. Urinalysis appeared grossly infected, so ancef was started and foley was replaced. Urine culture has not grown single phenotype, though pt has improved slowly. Patient at baseline is wheelchair bound for last year, but interactive, not oriented to year or place at baseline, lives in [place] unit at [facility].    Assessment & Plan: Acute metabolic encephalopathy, slurred speech: No acute CVA on MRI, though extensive microvascular disease is noted. Expect this is mix of infectious, progression of chronic dementia, and element of delirium as symptoms seem to be waxing and waning. CT chest shows no infiltrate. - Spoke with neurology on call 3/19, pt still with slurred speech and neurologist recommended treat UTI completely and if this persists re consult but for now neurological interventions needed  - Continue antibiotics - SLP requested, has not formally evaluated swallow.    CA-UTI: Review of records dating back to 2012 indicated recurrent UTI with multiple sp. Present E. Col, Enterobacter, Pseudomnoas, Klebsiella - UCx from admission nonclonal.  - Taking po, so will transition to keflex from ancef in lieu of no culture data.  - Foley indication is due to urinary retention. This was changed during admission, will have pt follow up with urology in next 2 weeks.  History of stroke:  - Continue plavix  Hypokalemia - Supplemented and will repeat BMP in AM  Hypothyroidism: TSH 0.308 (LLN is 0.350). Doubt this minor of suppression would cause AMS, especially in absence of  hyperthyroidism signs.  - Check free T4 and T3. Recommend recheck as outpatient and modification of synthroid dose accordingly.  - Continue levothyroxine  SNF-acquired pressure wound: - Consulted WOC  DVT prophylaxis: Lovenox Code Status: Full Family Communication: Discussed with each daughter separately by phone   Disposition Plan: Return to memory care unit at [facility] once clinical trajectory improving, likely in next 24 hours.  Consultants:   None  Procedures:   None  Antimicrobials:   Ancef 3/17 > 3/20  Keflex 3/20 > 3/23  Subjective: Remains confused, per daughters, returning to baseline slowly.  Objective: Vitals:   02/04/17 1444 02/04/17 2311 02/05/17 0457 02/05/17 1543  BP: (!) 137/55 138/78 (!) 145/73 137/66  Pulse: 83 76 76 78  Resp: 17 16 16 17   Temp: 98.7 F (37.1 C) 99.2 F (37.3 C) 98.2 F (36.8 C) 98.4 F (36.9 C)  TempSrc:   Oral   SpO2: 96% 94% 97% 99%  Weight:      Height:        Intake/Output Summary (Last 24 hours) at 02/05/17 1835 Last data filed at 02/05/17 6387  Gross per 24 hour  Intake                0 ml  Output              550 ml  Net             -550 ml   Filed Weights   02/02/17 2208  Weight: 61.2 kg (135 lb)    Examination:  General exam: Pleasant, elderly female sitting up in bed  in no distress Respiratory system: Nonlabored on room air, no wheezing. Cardiovascular system: S1 & S2 heard, RRR. No rubs, gallops or clicks. No pedal edema. Gastrointestinal system: Abdomen is nondistended, soft and nontender. No organomegaly or masses felt.  Central nervous system: Alert, oriented to name, speaking very low volume but clear enough for understanding.    Data Reviewed: I have personally reviewed following labs and imaging studies  CBC:  Recent Labs Lab 02/02/17 1723 02/02/17 1745 02/03/17 0817 02/04/17 0459 02/05/17 0617  WBC 9.7  --  6.8 6.5 6.5  HGB 12.2 12.6 11.1* 10.0* 11.3*  HCT 38.1 37.0 33.9*  31.6* 35.4*  MCV 88.0  --  88.1 87.8 86.6  PLT 287  --  255 252 793   Basic Metabolic Panel:  Recent Labs Lab 02/02/17 1723 02/02/17 1745 02/03/17 0700 02/04/17 0459 02/05/17 0617  NA 139 140 140 140 141  K 3.9 4.1 4.1 3.1* 3.9  CL 104 104 113* 113* 106  CO2 25  --  21* 23 25  GLUCOSE 101* 98 93 86 110*  BUN 11 15 8  <5* <5*  CREATININE 0.54 0.50 0.47 0.49 0.52  CALCIUM 8.8*  --  8.0* 8.0* 8.9   Liver Function Tests:  Recent Labs Lab 02/02/17 1723  AST 19  ALT 14  ALKPHOS 90  BILITOT 0.7  PROT 6.7  ALBUMIN 3.4*   Coagulation Profile:  Recent Labs Lab 02/02/17 1723  INR 1.06   CBG:  Recent Labs Lab 02/02/17 1713  GLUCAP 92   Lipid Profile:  Recent Labs  02/03/17 0700  CHOL 109  HDL 37*  LDLCALC 54  TRIG 89  CHOLHDL 2.9   Thyroid Function Tests:  Recent Labs  02/02/17 2350  TSH 0.308*   Urine analysis:    Component Value Date/Time   COLORURINE YELLOW 02/02/2017 1722   APPEARANCEUR TURBID (A) 02/02/2017 1722   LABSPEC 1.016 02/02/2017 1722   PHURINE 8.0 02/02/2017 1722   GLUCOSEU NEGATIVE 02/02/2017 1722   HGBUR MODERATE (A) 02/02/2017 1722   BILIRUBINUR NEGATIVE 02/02/2017 1722   BILIRUBINUR 1+ 01/05/2015 1145   KETONESUR 5 (A) 02/02/2017 1722   PROTEINUR 100 (A) 02/02/2017 1722   UROBILINOGEN 0.2 01/05/2015 1145   UROBILINOGEN 1.0 09/26/2014 1802   NITRITE NEGATIVE 02/02/2017 1722   LEUKOCYTESUR SMALL (A) 02/02/2017 1722   Radiology Studies: Ct Chest Wo Contrast  Result Date: 02/04/2017 CLINICAL DATA:  Shortness of Breath EXAM: CT CHEST WITHOUT CONTRAST TECHNIQUE: Multidetector CT imaging of the chest was performed following the standard protocol without IV contrast. COMPARISON:  Chest x-ray 02/03/2017 FINDINGS: Cardiovascular: Heart is normal size. Dense diffuse coronary artery calcifications. Moderate aortic calcifications. No evidence of aneurysm. Mediastinum/Nodes: No mediastinal, hilar, or axillary adenopathy. Trachea and  esophagus unremarkable. Lungs/Pleura: Dependent opacities in the lower lungs bilaterally, likely dependent atelectasis. No visible effusions. Upper Abdomen: Prior cholecystectomy. Nonobstructing stone in the upper pole of the right kidney is partially imaged. Aortic calcifications. Musculoskeletal: Chest wall soft tissues are unremarkable. No acute bony abnormality. Degenerative changes throughout the thoracic spine. IMPRESSION: Dependent atelectasis in the lower lobes bilaterally. Advanced coronary artery disease, aortic atherosclerosis. No acute cardiopulmonary disease. Prior cholecystectomy. Electronically Signed   By: Rolm Baptise M.D.   On: 02/04/2017 08:52    Scheduled Meds: . acidophilus  1 capsule Oral Daily  .  ceFAZolin (ANCEF) IV  1 g Intravenous Q8H  . Chlorhexidine Gluconate Cloth  6 each Topical Q0600  . clopidogrel  75 mg Oral Daily  .  enoxaparin (LOVENOX) injection  40 mg Subcutaneous Q24H  . [START ON 02/06/2017] feeding supplement (ENSURE ENLIVE)  237 mL Oral BID BM  . feeding supplement (PRO-STAT SUGAR FREE 64)  30 mL Oral QHS  . levothyroxine  150 mcg Oral QAC breakfast  . LORazepam  0.5 mg Intravenous Once  . miconazole  1 application Topical BID  . multivitamin with minerals  1 tablet Oral Daily  . mupirocin ointment  1 application Nasal BID  . nystatin cream  1 application Topical BID   Continuous Infusions:    LOS: 2 days   Time spent: 25 minutes   Vance Gather, MD [facility] Pager [phone]   If 7PM-7AM, please contact night-coverage [email or web address] Password [email or web address] 02/05/2017, 6:35 PM

## 2017-02-06 DIAGNOSIS — N3000 Acute cystitis without hematuria: Secondary | ICD-10-CM

## 2017-02-06 LAB — BASIC METABOLIC PANEL
ANION GAP: 9 (ref 5–15)
BUN: 7 mg/dL (ref 6–20)
CHLORIDE: 105 mmol/L (ref 101–111)
CO2: 24 mmol/L (ref 22–32)
Calcium: 8.7 mg/dL — ABNORMAL LOW (ref 8.9–10.3)
Creatinine, Ser: 0.48 mg/dL (ref 0.44–1.00)
GFR calc non Af Amer: >60 mL/min (ref >60)
GLUCOSE: 127 mg/dL — AB (ref 65–99)
Potassium: 3.9 mmol/L (ref 3.5–5.1)
Sodium: 138 mmol/L (ref 135–145)

## 2017-02-06 LAB — T4, FREE: Free T4: 1.48 ng/dL — ABNORMAL HIGH (ref 0.61–1.12)

## 2017-02-06 MED ORDER — PRO-STAT SUGAR FREE PO LIQD: 30.0000 mL | Freq: Every day | ORAL | 0 refills | Status: DC

## 2017-02-06 MED ORDER — CEPHALEXIN 500 MG PO CAPS: 500.0000 mg | ORAL_CAPSULE | Freq: Two times a day (BID) | ORAL | 0 refills | Status: DC

## 2017-02-06 MED ORDER — ENSURE ENLIVE PO LIQD
237.0000 mL | Freq: Two times a day (BID) | ORAL | Status: AC
Start: 2017-02-06 — End: ?

## 2017-02-06 NOTE — Care Management Important Message (Signed)
Important Message  Patient Details  Name: [name] MRN: [id number] Date of Birth: [birth year]   Medicare Important Message Given:  Yes    Sharin Mons, RN 02/06/2017, 1:15 PM

## 2017-02-06 NOTE — Progress Notes (Signed)
Clinical Social Worker facilitated patient discharge including contacting patient family and facility to confirm patient discharge plans.  Clinical information faxed to facility and family agreeable with plan.  Family will be transporting patient back to facility .  RN Alvis Lemmings to call [phone] report prior to discharge.  Clinical Social Worker will sign off for now as social work intervention is no longer needed. Please consult [place] again if new need arises.  Rhea Pink, MSW, [place]

## 2017-02-06 NOTE — Progress Notes (Signed)
Patient was discharged to nursing home [facility]) by MD order; discharged instructions review and sent to facility with care notes; IV DIC; facility was called and report was given to the nurse who is going to receive the patient; patient will be escorted to the car by nurse via wheelchair. Family will be transporting patient back to facility.

## 2017-02-06 NOTE — Discharge Summary (Signed)
Physician Discharge Summary  BEATA BEASON FXT:[id number] DOB: 10-31-1931 DOA: 02/02/2017  PCP: [name], MD  Admit date: 02/02/2017 Discharge date: 02/06/2017  Admitted From: [facility] Disposition: [facility]   Recommendations for Outpatient Follow-up:  1. Follow up with PCP in 1-2 weeks 2. Recheck thyroid function tests in 4 - 6 weeks. TSH mildly suppressed at 0.308. Free T4 and T3 are pending at discharge. Synthroid dose continued.  3. Follow up with [facility] to address chronic indwelling foley catheter. 4. Will need to follow up with neurology in [facility] for advanced dementia. Had previously been seen in [place] years ago, but desires to transfer closer to home. Ambulatory referral placed.  5. Address moisture-associated skin dermatitis with vigilant perineal care. Recommend cleansing stage 2 sacral pressure wound and right buttocks wound with soap and water and to apply allevyn silicone foam dressing, change every 3 days and PRN soilage.  6. Offload heel pressure by floating calves. barrier cream with every incontinent episode and replacing of foam pad dressing to buttocks daily and whenever soiled.  7. Recommend podiatry evaluation for nail care. 8. Recommend foley catheter exchange every 2 weeks, or per urology recommendations.  9. Recommend ongoing speech therapy services for dysarthria and anarthria.    Home Health: NA Equipment/Devices: Foley replaced 3/17.  Discharge Condition: Stable CODE STATUS: Full Diet recommendation: Heart-healthy  Brief/Interim Summary: ALYA SMALTZ is a 83 y.o.femalewith a history of advanced dementia, stroke (most recently 04/2016), hypothyroidism, and indwelling foley urinary retentionwho presented with 1 week of worsening confusion and intermittent mumbling/slurred speech. Urinalysis appeared grossly infected, so ancef was started and foley was replaced. Urine culture has not grown single  phenotype, though pt has improved slowly. The patient's daughters report she is improved, with waxing/waning continued slurred speech at times. Initial CT head was negative for stroke, and subsequent MRI confirmed no acute stroke, though chronic microvascular disease was extensive. Diet was advanced with the patient having no problems with tolerance, though appetite is very poor. Ancef was transitioned to keflex which the patient has tolerated. Plan is to discharge back to memory care unit to complete the course for UTI and follow up as above.   Discharge Diagnoses:  Principal Problem:   Confusion Active Problems:   Hypothyroid   Essential hypertension, benign   Severe dementia   Dehydration   UTI (urinary tract infection) due to urinary indwelling Foley catheter (HCC)   Pressure injury of skin  Acute metabolic encephalopathy: No acute CVA on MRI, though extensive microvascular disease is noted. Expect this is mix of infectious, progression of chronic dementia, and element of delirium as symptoms seem to be waxing and waning. CT chest shows no infiltrate. - Spoke with neurology on call 3/19, due to intermittent mumbling/slurred speech and neurologist recommended treat UTI completely. Pt has improved, at times to baseline, though at times is less interactive. No indication for urgent consult. 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 - SLP will need to follow as outpatient. - Will need to follow up with neurology once pt has completely recovered from UTI   CA-UTI: Review of records dating back to 2012 indicated recurrent UTI with multiple spp. including E. coli, Enterobacter, Pseudomonas, Klebsiella. Though some of these were not susceptible to 1st gen cephalosporins, the patient has improved on ancef, so will continue 1st gen cephalosporin. Cipro  and  sulfa allergies noted.  - UCx from admission nonclonal.  - Tolerated keflex (changed from from ancef in lieu of no culture data), will continue for total 7 days.  - Foley indication is due to urinary retention. This was changed during admission, will have pt follow up with [facility] in next 2 weeks.  History of stroke: - Continue plavix  Hypokalemia - Supplemented and will repeat BMP in AM  Hypothyroidism: TSH 0.308 (LLN is 0.350). Doubt this minor of suppression would cause AMS, especially in absence of hyperthyroidism signs.  - Check free T4 and T3. Recommend recheck as outpatient and modification of synthroid dose accordingly.  - Continue levothyroxine  Stage 2 pressure injury to right buttock, deep tissue injury to sacrum: Present on admission. Also with moisture-associated skin damage to bilateral thighs.  - Consulted WOC, recommendations as above.   Discharge Instructions Discharge Instructions    Ambulatory referral to Neurology    Complete by:  As directed    An appointment is requested in approximately: 4 weeks. To establish care for advanced dementia.     Allergies as of 02/06/2017      Reactions   Ciprofloxacin Rash   Citalopram Hydrobromide Other (See Comments)   Shaky inside   Sulfa Antibiotics Rash      Medication List    TAKE these medications   acetaminophen 325 MG tablet Commonly known as:  TYLENOL Take 325 mg by mouth every 6 (six) hours as needed for mild pain (pain).   Acidophilus/Goat Milk Caps Take 1 capsule by mouth daily.   atorvastatin 20 MG tablet Commonly known as:  LIPITOR Take 1 tablet (20 mg total) by mouth daily at 6 PM.   cephALEXin 500 MG capsule Commonly known as:  KEFLEX Take 1 capsule (500 mg total) by mouth every 12 (twelve) hours.   clopidogrel 75 MG tablet Commonly known as:  PLAVIX Take 75 mg by mouth daily.   feeding supplement (ENSURE ENLIVE) Liqd Take 237 mLs by mouth 2 (two) times daily between meals.    feeding supplement (PRO-STAT SUGAR FREE 64) Liqd Take 30 mLs by mouth at bedtime.   levothyroxine 150 MCG tablet Commonly known as:  SYNTHROID, LEVOTHROID Take 150 mcg by mouth daily before breakfast.   miconazole 2 % cream Commonly known as:  MICOTIN Apply 1 application topically 2 (two) times daily.   multivitamin tablet Take 1 tablet by mouth daily.   nystatin cream Commonly known as:  MYCOSTATIN Apply 1 application topically 2 (two) times daily.   sertraline 25 MG tablet Commonly known as:  ZOLOFT Take 25 mg by mouth daily.      Follow-up Information    [name], MD Follow up.   Specialty:  Family Medicine Contact information: [place] [address] [address]. Schedule an appointment as soon as possible for a visit in 1 week(s).   Contact information: [place] [phone]         Allergies  Allergen Reactions  . Ciprofloxacin Rash  . Citalopram Hydrobromide Other (See Comments)    Shaky inside  . Sulfa Antibiotics Rash    Consultations:  Neurology by phone  Urology by phone  Procedures/Studies: Dg Chest 2 View  Result Date: 02/03/2017 CLINICAL DATA:  Followup pulmonary infiltrate EXAM: CHEST  2 VIEW COMPARISON:  02/02/2017.  05/17/2016. FINDINGS: Heart size is normal. There is aortic atherosclerosis. There  chronic interstitial lung markings but no sign of active infiltrate, mass, effusion or collapse. Chronic degenerative changes affect the spine. IMPRESSION: No active disease. Chronic interstitial lung markings. Aortic atherosclerosis. Area questioned in the left lower lobe yesterday looks clear today. Electronically Signed   By: Nelson Chimes M.D.   On: 02/03/2017 12:55   Ct Head Wo Contrast  Result Date: 02/02/2017 CLINICAL DATA:  83 year old female with history of altered mental status. Dementia. EXAM: CT HEAD WITHOUT CONTRAST  TECHNIQUE: Contiguous axial images were obtained from the base of the skull through the vertex without intravenous contrast. COMPARISON:  Head CT 05/17/2016.  Brain MRI 05/17/2016. FINDINGS: Comment:  Study is slightly limited by considerable patient motion. Brain: Patchy and confluent areas of decreased attenuation are noted throughout the deep and periventricular white matter of the cerebral hemispheres bilaterally, compatible with severe chronic microvascular ischemic disease. No evidence of acute infarction, hemorrhage, hydrocephalus, extra-axial collection or mass lesion/mass effect. Vascular: No hyperdense vessel or unexpected calcification. Skull: Normal. Negative for fracture or focal lesion. Sinuses/Orbits: No acute finding. Other: None. IMPRESSION: 1. No acute intracranial abnormalities on today's motion limited examination. 2. Extensive chronic microvascular ischemic changes in the cerebral white matter, as above. Electronically Signed   By: Vinnie Langton M.D.   On: 02/02/2017 18:35   Ct Chest Wo Contrast  Result Date: 02/04/2017 CLINICAL DATA:  Shortness of Breath EXAM: CT CHEST WITHOUT CONTRAST TECHNIQUE: Multidetector CT imaging of the chest was performed following the standard protocol without IV contrast. COMPARISON:  Chest x-ray 02/03/2017 FINDINGS: Cardiovascular: Heart is normal size. Dense diffuse coronary artery calcifications. Moderate aortic calcifications. No evidence of aneurysm. Mediastinum/Nodes: No mediastinal, hilar, or axillary adenopathy. Trachea and esophagus unremarkable. Lungs/Pleura: Dependent opacities in the lower lungs bilaterally, likely dependent atelectasis. No visible effusions. Upper Abdomen: Prior cholecystectomy. Nonobstructing stone in the upper pole of the right kidney is partially imaged. Aortic calcifications. Musculoskeletal: Chest wall soft tissues are unremarkable. No acute bony abnormality. Degenerative changes throughout the thoracic spine. IMPRESSION:  Dependent atelectasis in the lower lobes bilaterally. Advanced coronary artery disease, aortic atherosclerosis. No acute cardiopulmonary disease. Prior cholecystectomy. Electronically Signed   By: Rolm Baptise M.D.   On: 02/04/2017 08:52   Mr Brain Wo Contrast  Result Date: 02/03/2017 CLINICAL DATA:  Confusion and slurred speech EXAM: MRI HEAD WITHOUT CONTRAST TECHNIQUE: Multiplanar, multiecho pulse sequences of the brain and surrounding structures were obtained without intravenous contrast. COMPARISON:  Head CT 02/02/2017 Brain MRI 05/17/2016 FINDINGS: Brain: No focal diffusion restriction to indicate acute infarct. No intraparenchymal hemorrhage. There is diffuse confluent hyperintense T2-weighted signal within the periventricular and deep white matter, most often seen in the setting of chronic microvascular ischemia. No mass lesion or midline shift. No hydrocephalus or extra-axial fluid collection. The midline structures are normal. No age advanced or lobar predominant atrophy. Vascular: Major intracranial arterial and venous sinus flow voids are preserved. No evidence of chronic microhemorrhage or amyloid angiopathy. Skull and upper cervical spine: The visualized skull base, calvarium, upper cervical spine and extracranial soft tissues are normal. Sinuses/Orbits: No fluid levels or advanced mucosal thickening. No mastoid effusion. Normal orbits. IMPRESSION: Severe chronic microvascular ischemia without acute intracranial abnormality. Electronically Signed   By: Ulyses Jarred M.D.   On: 02/03/2017 04:06   Dg Chest Port 1 View  Result Date: 02/02/2017 CLINICAL DATA:  Acute mental status change. EXAM: PORTABLE CHEST 1 VIEW COMPARISON:  May 17, 2016 FINDINGS: No pneumothorax. The cardiomediastinal silhouette is stable with a tortuous thoracic aorta.  Mild increased interstitial markings. Mild hazy opacity in left base. IMPRESSION: Suggested mild pulmonary venous congestion. Subtle but more focal opacity in  the left base could represent than early infiltrate. Recommend follow-up to resolution. Electronically Signed   By: Dorise Bullion III M.D   On: 02/02/2017 17:58   Subjective: Pt more interactive this morning, daughter at bedside who is HCPOA reports she was more bright-eyed last night, eating ok. Did not eat this morning except what she was essentially force-fed.   Discharge Exam: Vitals:   02/05/17 2200 02/06/17 0626  BP: (!) 127/53 (!) 112/52  Pulse: 74 77  Resp: 18 20  Temp: 98.8 F (37.1 C) 98.2 F (36.8 C)   General exam: Pleasant, elderly female sitting up in bed in no distress Respiratory system: Nonlabored on room air, no wheezing. Cardiovascular system: S1 & S2 heard, RRR. No rubs, gallops or clicks. No pedal edema. Gastrointestinal system: Abdomen is nondistended, soft and nontender. No organomegaly or masses felt.  Central nervous system: Alert, oriented to name, speaking very low volume but clear enough for understanding.   Skin: Sacrum maroon intact nonblanchable redness 2 cm x 2 cm. Right buttocks 2 cm x 1.5 cm x 0.1 cm. Also denuded linear breakdown to bilateral thigh/groin 0.2 cm depth  Pink and moist, no purulence, induration or fluctuance noted.   The results of significant diagnostics from this hospitalization (including imaging, microbiology, ancillary and laboratory) are listed below for reference.    Labs: BNP (last 3 results)  Recent Labs  02/02/17 2350  BNP 75.6   Basic Metabolic Panel:  Recent Labs Lab 02/02/17 1723 02/02/17 1745 02/03/17 0700 02/04/17 0459 02/05/17 0617 02/06/17 0935  NA 139 140 140 140 141 138  K 3.9 4.1 4.1 3.1* 3.9 3.9  CL 104 104 113* 113* 106 105  CO2 25  --  21* 23 25 24   GLUCOSE 101* 98 93 86 110* 127*  BUN 11 15 8  <5* <5* 7  CREATININE 0.54 0.50 0.47 0.49 0.52 0.48  CALCIUM 8.8*  --  8.0* 8.0* 8.9 8.7*   Liver Function Tests:  Recent Labs Lab 02/02/17 1723  AST 19  ALT 14  ALKPHOS 90  BILITOT 0.7  PROT  6.7  ALBUMIN 3.4*   CBC:  Recent Labs Lab 02/02/17 1723 02/02/17 1745 02/03/17 0817 02/04/17 0459 02/05/17 0617  WBC 9.7  --  6.8 6.5 6.5  HGB 12.2 12.6 11.1* 10.0* 11.3*  HCT 38.1 37.0 33.9* 31.6* 35.4*  MCV 88.0  --  88.1 87.8 86.6  PLT 287  --  255 252 258   CBG:  Recent Labs Lab 02/02/17 1713  GLUCAP 92   Thyroid function studies No results for input(s): TSH, T4TOTAL, T3FREE, THYROIDAB in the last 72 hours.  Invalid input(s): FREET3   Urinalysis    Component Value Date/Time   COLORURINE YELLOW 02/02/2017 1722   APPEARANCEUR TURBID (A) 02/02/2017 1722   LABSPEC 1.016 02/02/2017 1722   PHURINE 8.0 02/02/2017 1722   GLUCOSEU NEGATIVE 02/02/2017 1722   HGBUR MODERATE (A) 02/02/2017 1722   BILIRUBINUR NEGATIVE 02/02/2017 1722   BILIRUBINUR 1+ 01/05/2015 1145   KETONESUR 5 (A) 02/02/2017 1722   PROTEINUR 100 (A) 02/02/2017 1722   UROBILINOGEN 0.2 01/05/2015 1145   UROBILINOGEN 1.0 09/26/2014 1802   NITRITE NEGATIVE 02/02/2017 1722   LEUKOCYTESUR SMALL (A) 02/02/2017 1722    Microbiology Recent Results (from the past 240 hour(s))  Culture, Urine     Status: Abnormal   Collection Time:  02/02/17 12:05 AM  Result Value Ref Range Status   Specimen Description URINE, CATHETERIZED  Final   Special Requests NONE  Final   Culture MULTIPLE SPECIES PRESENT, SUGGEST RECOLLECTION (A)  Final   Report Status 02/04/2017 FINAL  Final  Blood culture (routine x 2)     Status: None (Preliminary result)   Collection Time: 02/02/17  5:23 PM  Result Value Ref Range Status   Specimen Description RIGHT ANTECUBITAL  Final   Special Requests AEROBIC BOTTLE ONLY 10CC  Final   Culture NO GROWTH 4 DAYS  Final   Report Status PENDING  Incomplete  Blood culture (routine x 2)     Status: None (Preliminary result)   Collection Time: 02/02/17  5:48 PM  Result Value Ref Range Status   Specimen Description BLOOD LEFT ANTECUBITAL  Final   Special Requests BOTTLES DRAWN AEROBIC ONLY 10CC   Final   Culture NO GROWTH 4 DAYS  Final   Report Status PENDING  Incomplete  MRSA PCR Screening     Status: Abnormal   Collection Time: 02/04/17 11:31 AM  Result Value Ref Range Status   MRSA by PCR POSITIVE (A) NEGATIVE Final    Comment:        The GeneXpert MRSA Assay (FDA approved for NASAL specimens only), is one component of a comprehensive MRSA colonization surveillance program. It is not intended to diagnose MRSA infection nor to guide or monitor treatment for MRSA infections. RESULT CALLED TO, READ BACK BY AND VERIFIED WITH: Jefferson Fuel RN 13:35 02/04/17 ([email or web address])     Time coordinating discharge: Approximately 40 minutes  Vance Gather, MD  [facility] 02/06/2017, 12:03 PM Pager [phone]  If 7PM-7AM, please contact night-coverage [email or web address] Password [email or web address]

## 2017-02-06 NOTE — NC FL2 (Signed)
[place] MEDICAID FL2 LEVEL OF CARE SCREENING TOOL     IDENTIFICATION  Patient Name: [name] Birthdate: [birth year] Sex: female Admission Date (Current Location): 02/02/2017  [facility] and [place] Number:  Herbalist and Address:  [facility], [place] [address], [place], [place] [address]      Provider Number: [id number]  Attending Physician Name and Address:  Patrecia Pour, MD  Relative Name and Phone Number:  [name], daughter, [phone]    Current Level of Care: Hospital Recommended Level of Care: Memory Care Prior Approval Number:    Date Approved/Denied:   PASRR Number:    Discharge Plan: Other (Comment) (Memory Care)    Current Diagnoses: Patient Active Problem List   Diagnosis Date Noted  . Pressure injury of skin 02/03/2017  . Confusion 02/02/2017  . CVA (cerebral vascular accident) ([place]) 05/18/2016  . B12 deficiency 05/18/2016  . Encephalopathy acute 05/17/2016  . Encephalopathy 05/17/2016  . Acute UTI 05/17/2016  . Leukocytosis 05/17/2016  . Dehydration 05/17/2016  . H/O: CVA (cerebrovascular accident) 05/17/2016  . UTI (urinary tract infection) due to urinary indwelling Foley catheter ([place]) 05/17/2016  . Acute encephalopathy 05/17/2016  . Postoperative anemia due to acute blood loss 09/28/2014  . Urinary retention 09/28/2014  . Infection of urinary tract 09/28/2014  . Severe dementia 09/28/2014  . Left displaced femoral neck fracture ([place]) 09/23/2014  . Vascular dementia 09/17/2014  . 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 Essential hypertension, benign 07/22/2013  . Acute posthemorrhagic anemia 07/22/2013  . Anemia 07/03/2013  . Constipation 07/03/2013  . Insomnia 07/03/2013  . Recurrent UTI 03/27/2013  . Preoperative evaluation to rule out surgical contraindication 12/24/2012  . Cognitive impairment 04/02/2012  . Osteoarthritis of knee 03/12/2011  . Hypothyroid 02/26/2011  . TIA (transient ischemic  attack) 02/26/2011  . Depression 02/26/2011  . Hyperlipemia 02/26/2011  . Alcohol abuse 02/26/2011    Orientation RESPIRATION BLADDER Height & Weight     Self  Normal Incontinent, Indwelling catheter Weight: 135 lb (61.2 kg) Height:  5\' 2"  (157.5 cm)  BEHAVIORAL SYMPTOMS/MOOD NEUROLOGICAL BOWEL NUTRITION STATUS      Incontinent Diet (Regular)  AMBULATORY STATUS COMMUNICATION OF NEEDS Skin     Verbally Other (Comment), PU Stage and Appropriate Care (Deep tissue injury on sacrum; Pressure injury stage II on thighs and buttocks)                       Personal Care Assistance Level of Assistance  Bathing, Feeding, Dressing Bathing Assistance: Maximum assistance Feeding assistance: Limited assistance Dressing Assistance: Limited assistance     Functional Limitations Info             SPECIAL CARE FACTORS FREQUENCY  Speech therapy             Speech Therapy Frequency: 3x/week      Contractures      Additional Factors Info  Isolation Precautions Code Status Info: Full Allergies Info: Citalopram Hydrobromide, Sulfa Antibiotics, Ciprofloxacin     Isolation Precautions Info: MRSA in the [place] (Colonized, no infection)       Discharge Medications: Please see discharge summary for a list of discharge medications. TAKE these medications   acetaminophen 325 MG tablet Commonly known as:  TYLENOL Take 325 mg by mouth every 6 (six) hours as needed for mild pain (pain).   Acidophilus/Goat Milk Caps Take 1 capsule by mouth daily.   atorvastatin 20 MG tablet Commonly known as:  LIPITOR Take 1 tablet (20 mg total) by mouth daily at 6 PM.   cephALEXin 500 MG capsule Commonly known as:  KEFLEX Take 1 capsule (500 mg total) by mouth every 12 (twelve) hours.   clopidogrel 75 MG tablet Commonly known as:  PLAVIX Take 75 mg by mouth daily.   feeding supplement (ENSURE ENLIVE) Liqd Take 237 mLs by mouth 2 (two) times daily between meals.   feeding  supplement (PRO-STAT SUGAR FREE 64) Liqd Take 30 mLs by mouth at bedtime.   levothyroxine 150 MCG tablet Commonly known as:  SYNTHROID, LEVOTHROID Take 150 mcg by mouth daily before breakfast.   miconazole 2 % cream Commonly known as:  MICOTIN Apply 1 application topically 2 (two) times daily.   multivitamin tablet Take 1 tablet by mouth daily.   nystatin cream Commonly known as:  MYCOSTATIN Apply 1 application topically 2 (two) times daily.   sertraline 25 MG tablet Commonly known as:  ZOLOFT Take 25 mg by mouth daily.       Relevant Imaging Results:  Relevant Lab Results:   Additional Information SSN: [id number]  Wende Neighbors, LCSW

## 2017-02-06 NOTE — Progress Notes (Signed)
  Speech Language Pathology Treatment: Cognitive-Linquistic (speech intelligibility)  Patient Details Name: [name] MRN: [id number] DOB: Aug 24, 1931 Today's Date: 02/06/2017 Time: 0850-0901 SLP Time Calculation (min) (ACUTE ONLY): 11 min  Assessment / Plan / Recommendation Clinical Impression   [name] was alert and daughter was present to provide update on pt's speech intelligibility progress. Daughter reported that her mother's speech was relatively intelligible the prior night and she feels that it has drastically improved since the hospital admission. SLP provided skilled intervention to increase Ms. Masci's speech intelligibility. Emphasized and educated re: the importance of 1) increasing volume/loudness, 2) overarticulating, and 3) slowing rate. Pt was able to substantially increase loudness and slow her rate during practice trials, although she had difficulty with overarticulation. Provided written/visual instructions of speech intelligibility techniques to the pt and daughter to utilize for continued practice. Prognosis for improvement of speech is fair given the pt's advanced dementia (daughter is aware). Daughter stated that discharge is likely for today and pt will return to SNF memory care. ST will continue to f/u for treatment while pt remains in acute care.    HPI HPI: [name] a 83 y.o.femalewith a PMH significant for advanced dementia, hx of stroke (most recently 04/2016), urinary retention, and hypothyroidism who presents with 1 week confusion and slurred speech. Per family, about 2 weeks ago, she started to develop a foul smell in her room like urine, complained to her daughter about pain at her foley. CXR showed no active disease. Chronic interstitial lung markings. Aortic atherosclerosis. Area questioned in the left lower lobe on 3/17 looks clear today (3/18). MRI of brain showed severe chronic microvascular ischemia without acute intracranial abnormality.      SLP Plan  Continue with current plan of care       Recommendations                   Follow up Recommendations: Skilled Nursing facility SLP Visit Diagnosis: Dysarthria and anarthria (R47.1) Plan: Continue with current plan of care       [place] , [place] 02/06/2017, 10:53 AM

## 2017-02-06 NOTE — Progress Notes (Signed)
Memory Care is going to contact CSW after they ask their director if they can take patient back with MRSA precautions.  Percell Locus Ulonda Klosowski LCSWA [phone]

## 2017-02-06 NOTE — Progress Notes (Signed)
Physical Therapy Treatment Patient Details Name: [name] MRN: [id number] DOB: 10-02-1931 Today's Date: 02/06/2017    History of Present Illness Pt is an 83 y.o. female who presented to ED on 02/02/17 with 1 week confusion and slurred speech. PMH significant for advanced dementia, hx of CVA (most recently 04/2016), hypothyroidism, and indwelling foley because of dementia, depression, arthritis.     PT Comments    Pt performed increased mobility to edge of bed and transfer to chair with use of stedy frame.  Pt then required +2 external assistance to achieve.  Plan to d/c back to memory care unit at [facility].    Follow Up Recommendations  Other (comment) (back to memory care unit at [facility].  )     Equipment Recommendations  None recommended by PT    Recommendations for Other Services       Precautions / Restrictions Precautions Precautions: Fall Restrictions Weight Bearing Restrictions: No    Mobility  Bed Mobility Overal bed mobility: Needs Assistance Bed Mobility: Supine to Sit     Supine to sit: Max assist;+2 for physical assistance     General bed mobility comments: Increased time for bed mobility; requiring multimodal cues for movement initiation and maxA for trunk support and LE maneuver into sitting.  Assist required to return to seated position.    Transfers Overall transfer level: Needs assistance Equipment used:  (sara stedy frame.  ) Transfers: Sit to/from [facility] Sit to Stand: Max assist;+2 physical assistance Stand pivot transfers:  (total assist in stedy frame.  )       General transfer comment: Pt required multimodal cues to initiate standing, unable to achieve fully upright; Pt required use of chux pad to achieve extension with hip to move stedy plates behind her bottom for support.    Ambulation/Gait                 Stairs            Wheelchair Mobility    Modified Rankin (Stroke Patients  Only)       Balance Overall balance assessment: Needs assistance   Sitting balance-Leahy Scale: Poor       Standing balance-Leahy Scale: Zero                      Cognition Arousal/Alertness: Awake/alert Behavior During Therapy: WFL for tasks assessed/performed Overall Cognitive Status: Impaired/Different from baseline Area of Impairment: Attention;Memory;Following commands;Awareness;Problem solving   Current Attention Level: Sustained Memory: Decreased recall of precautions Following Commands: Follows one step commands inconsistently;Follows one step commands with increased time;Follows multi-step commands inconsistently   Awareness: Intellectual Problem Solving: Slow processing;Decreased initiation;Difficulty sequencing;Requires verbal cues;Requires tactile cues General Comments: Slurred speech with intermittent moments of it being comprehensible, which made cognitive assessment difficulty.     Exercises      General Comments        Pertinent Vitals/Pain Pain Assessment: Faces Faces Pain Scale: Hurts a little bit Pain Location: LLE Pain Descriptors / Indicators: Aching Pain Intervention(s): Monitored during session    Home Living                      Prior Function            PT Goals (current goals can now be found in the care plan section) Acute Rehab PT Goals Patient Stated Goal: Return to [facility] Potential to Achieve Goals: Fair Progress towards PT goals: Progressing toward  goals    Frequency    Min 2X/week      PT Plan Current plan remains appropriate    Co-evaluation             End of Session Equipment Utilized During Treatment: Gait belt Activity Tolerance:  (limited due to cognition) Patient left: in chair;with call bell/phone within reach;with chair alarm set Nurse Communication: Mobility status PT Visit Diagnosis: Muscle weakness (generalized) (M62.81);Difficulty in walking, not elsewhere classified  (R26.2)     Time: 9539-6728 PT Time Calculation (min) (ACUTE ONLY): 16 min  Charges:  $Therapeutic Activity: 8-22 mins                    G Codes:       Cristela Blue 03/07/2017, 4:46 PM Governor Rooks, PTA pager [phone]

## 2017-02-07 LAB — CULTURE, BLOOD (ROUTINE X 2)
CULTURE: NO GROWTH
Culture: NO GROWTH

## 2017-02-07 LAB — T3: T3 TOTAL: 80 ng/dL (ref 71–180)

## 2017-02-13 DIAGNOSIS — G459 Transient cerebral ischemic attack, unspecified: Secondary | ICD-10-CM | POA: Diagnosis not present

## 2017-02-13 DIAGNOSIS — E785 Hyperlipidemia, unspecified: Secondary | ICD-10-CM | POA: Diagnosis not present

## 2017-02-13 DIAGNOSIS — N390 Urinary tract infection, site not specified: Secondary | ICD-10-CM | POA: Diagnosis not present

## 2017-02-13 DIAGNOSIS — F0280 Dementia in other diseases classified elsewhere without behavioral disturbance: Secondary | ICD-10-CM | POA: Diagnosis not present

## 2017-02-13 DIAGNOSIS — R339 Retention of urine, unspecified: Secondary | ICD-10-CM | POA: Diagnosis not present

## 2017-02-13 DIAGNOSIS — F320 Major depressive disorder, single episode, mild: Secondary | ICD-10-CM | POA: Diagnosis not present

## 2017-02-13 DIAGNOSIS — L89301 Pressure ulcer of unspecified buttock, stage 1: Secondary | ICD-10-CM | POA: Diagnosis not present

## 2017-02-13 DIAGNOSIS — E039 Hypothyroidism, unspecified: Secondary | ICD-10-CM | POA: Diagnosis not present

## 2017-02-13 DIAGNOSIS — K5901 Slow transit constipation: Secondary | ICD-10-CM | POA: Diagnosis not present

## 2017-02-20 DIAGNOSIS — G20 Parkinson's disease: Secondary | ICD-10-CM | POA: Diagnosis not present

## 2017-02-20 DIAGNOSIS — F320 Major depressive disorder, single episode, mild: Secondary | ICD-10-CM | POA: Diagnosis not present

## 2017-02-20 DIAGNOSIS — F0280 Dementia in other diseases classified elsewhere without behavioral disturbance: Secondary | ICD-10-CM | POA: Diagnosis not present

## 2017-02-20 DIAGNOSIS — E785 Hyperlipidemia, unspecified: Secondary | ICD-10-CM | POA: Diagnosis not present

## 2017-02-20 DIAGNOSIS — E039 Hypothyroidism, unspecified: Secondary | ICD-10-CM | POA: Diagnosis not present

## 2017-02-20 DIAGNOSIS — G459 Transient cerebral ischemic attack, unspecified: Secondary | ICD-10-CM | POA: Diagnosis not present

## 2017-02-24 ENCOUNTER — Emergency Department (HOSPITAL_COMMUNITY): Payer: Medicare HMO

## 2017-02-24 ENCOUNTER — Encounter (HOSPITAL_COMMUNITY): Payer: Self-pay

## 2017-02-24 ENCOUNTER — Inpatient Hospital Stay (HOSPITAL_COMMUNITY)
Admission: EM | Admit: 2017-02-24 | Discharge: 2017-03-05 | DRG: 698 | Disposition: A | Discharge status: Skilled Nursing Facility | Payer: Medicare HMO | Attending: Family Medicine | Admitting: Family Medicine

## 2017-02-24 DIAGNOSIS — D72829 Elevated white blood cell count, unspecified: Secondary | ICD-10-CM | POA: Diagnosis not present

## 2017-02-24 DIAGNOSIS — F0150 Vascular dementia without behavioral disturbance: Secondary | ICD-10-CM | POA: Diagnosis present

## 2017-02-24 DIAGNOSIS — Z8673 Personal history of transient ischemic attack (TIA), and cerebral infarction without residual deficits: Secondary | ICD-10-CM

## 2017-02-24 DIAGNOSIS — R52 Pain, unspecified: Secondary | ICD-10-CM

## 2017-02-24 DIAGNOSIS — Z7902 Long term (current) use of antithrombotics/antiplatelets: Secondary | ICD-10-CM

## 2017-02-24 DIAGNOSIS — Z87440 Personal history of urinary (tract) infections: Secondary | ICD-10-CM

## 2017-02-24 DIAGNOSIS — Z515 Encounter for palliative care: Secondary | ICD-10-CM

## 2017-02-24 DIAGNOSIS — E876 Hypokalemia: Secondary | ICD-10-CM | POA: Diagnosis not present

## 2017-02-24 DIAGNOSIS — R404 Transient alteration of awareness: Secondary | ICD-10-CM | POA: Diagnosis not present

## 2017-02-24 DIAGNOSIS — Z96642 Presence of left artificial hip joint: Secondary | ICD-10-CM | POA: Diagnosis present

## 2017-02-24 DIAGNOSIS — Z7189 Other specified counseling: Secondary | ICD-10-CM

## 2017-02-24 DIAGNOSIS — Z79899 Other long term (current) drug therapy: Secondary | ICD-10-CM | POA: Diagnosis not present

## 2017-02-24 DIAGNOSIS — G459 Transient cerebral ischemic attack, unspecified: Secondary | ICD-10-CM | POA: Diagnosis present

## 2017-02-24 DIAGNOSIS — T83518A Infection and inflammatory reaction due to other urinary catheter, initial encounter: Secondary | ICD-10-CM | POA: Diagnosis not present

## 2017-02-24 DIAGNOSIS — N390 Urinary tract infection, site not specified: Secondary | ICD-10-CM | POA: Diagnosis present

## 2017-02-24 DIAGNOSIS — T83511A Infection and inflammatory reaction due to indwelling urethral catheter, initial encounter: Secondary | ICD-10-CM | POA: Diagnosis not present

## 2017-02-24 DIAGNOSIS — N200 Calculus of kidney: Secondary | ICD-10-CM | POA: Diagnosis present

## 2017-02-24 DIAGNOSIS — E079 Disorder of thyroid, unspecified: Secondary | ICD-10-CM | POA: Diagnosis present

## 2017-02-24 DIAGNOSIS — G9340 Encephalopathy, unspecified: Secondary | ICD-10-CM | POA: Diagnosis present

## 2017-02-24 DIAGNOSIS — F329 Major depressive disorder, single episode, unspecified: Secondary | ICD-10-CM | POA: Diagnosis present

## 2017-02-24 DIAGNOSIS — R9431 Abnormal electrocardiogram [ECG] [EKG]: Secondary | ICD-10-CM | POA: Diagnosis not present

## 2017-02-24 DIAGNOSIS — G20 Parkinson's disease: Secondary | ICD-10-CM | POA: Diagnosis present

## 2017-02-24 DIAGNOSIS — G92 Toxic encephalopathy: Secondary | ICD-10-CM | POA: Diagnosis present

## 2017-02-24 DIAGNOSIS — G4700 Insomnia, unspecified: Secondary | ICD-10-CM | POA: Diagnosis present

## 2017-02-24 DIAGNOSIS — L309 Dermatitis, unspecified: Secondary | ICD-10-CM | POA: Diagnosis present

## 2017-02-24 DIAGNOSIS — L89152 Pressure ulcer of sacral region, stage 2: Secondary | ICD-10-CM | POA: Diagnosis present

## 2017-02-24 DIAGNOSIS — E039 Hypothyroidism, unspecified: Secondary | ICD-10-CM | POA: Diagnosis present

## 2017-02-24 DIAGNOSIS — F0390 Unspecified dementia without behavioral disturbance: Secondary | ICD-10-CM | POA: Diagnosis not present

## 2017-02-24 DIAGNOSIS — R410 Disorientation, unspecified: Secondary | ICD-10-CM

## 2017-02-24 DIAGNOSIS — L8992 Pressure ulcer of unspecified site, stage 2: Secondary | ICD-10-CM | POA: Diagnosis not present

## 2017-02-24 DIAGNOSIS — M1611 Unilateral primary osteoarthritis, right hip: Secondary | ICD-10-CM | POA: Diagnosis not present

## 2017-02-24 DIAGNOSIS — Y846 Urinary catheterization as the cause of abnormal reaction of the patient, or of later complication, without mention of misadventure at the time of the procedure: Secondary | ICD-10-CM | POA: Diagnosis present

## 2017-02-24 DIAGNOSIS — R339 Retention of urine, unspecified: Secondary | ICD-10-CM | POA: Diagnosis not present

## 2017-02-24 DIAGNOSIS — R55 Syncope and collapse: Secondary | ICD-10-CM | POA: Diagnosis not present

## 2017-02-24 DIAGNOSIS — E038 Other specified hypothyroidism: Secondary | ICD-10-CM

## 2017-02-24 DIAGNOSIS — R0902 Hypoxemia: Secondary | ICD-10-CM | POA: Diagnosis not present

## 2017-02-24 DIAGNOSIS — R451 Restlessness and agitation: Secondary | ICD-10-CM | POA: Diagnosis not present

## 2017-02-24 DIAGNOSIS — F05 Delirium due to known physiological condition: Secondary | ICD-10-CM | POA: Diagnosis not present

## 2017-02-24 DIAGNOSIS — L89312 Pressure ulcer of right buttock, stage 2: Secondary | ICD-10-CM | POA: Diagnosis present

## 2017-02-24 DIAGNOSIS — R4182 Altered mental status, unspecified: Secondary | ICD-10-CM | POA: Diagnosis not present

## 2017-02-24 HISTORY — DX: Alzheimer's disease, unspecified: G30.9

## 2017-02-24 HISTORY — DX: Presence of other specified devices: Z97.8

## 2017-02-24 HISTORY — DX: Parkinson's disease: G20

## 2017-02-24 HISTORY — DX: Insomnia, unspecified: G47.00

## 2017-02-24 HISTORY — DX: Carrier or suspected carrier of methicillin resistant Staphylococcus aureus: Z22.322

## 2017-02-24 HISTORY — DX: Transient cerebral ischemic attack, unspecified: G45.9

## 2017-02-24 HISTORY — DX: Presence of urogenital implants: Z96.0

## 2017-02-24 HISTORY — DX: Dementia in other diseases classified elsewhere, unspecified severity, without behavioral disturbance, psychotic disturbance, mood disturbance, and anxiety: F02.80

## 2017-02-24 HISTORY — DX: Urinary tract infection, site not specified: N39.0

## 2017-02-24 HISTORY — DX: Parkinson's disease without dyskinesia, without mention of fluctuations: G20.A1

## 2017-02-24 LAB — COMPREHENSIVE METABOLIC PANEL
ALT: 13 U/L — ABNORMAL LOW (ref 14–54)
ANION GAP: 9 (ref 5–15)
AST: 19 U/L (ref 15–41)
Albumin: 3.5 g/dL (ref 3.5–5.0)
Alkaline Phosphatase: 92 U/L (ref 38–126)
BUN: 17 mg/dL (ref 6–20)
CALCIUM: 9 mg/dL (ref 8.9–10.3)
CO2: 24 mmol/L (ref 22–32)
Chloride: 108 mmol/L (ref 101–111)
Creatinine, Ser: 0.61 mg/dL (ref 0.44–1.00)
GFR calc non Af Amer: >60 mL/min (ref >60)
Glucose, Bld: 122 mg/dL — ABNORMAL HIGH (ref 65–99)
Potassium: 3.9 mmol/L (ref 3.5–5.1)
SODIUM: 141 mmol/L (ref 135–145)
Total Bilirubin: 0.9 mg/dL (ref 0.3–1.2)
Total Protein: 6.9 g/dL (ref 6.5–8.1)

## 2017-02-24 LAB — URINALYSIS, ROUTINE W REFLEX MICROSCOPIC
BILIRUBIN URINE: NEGATIVE
BILIRUBIN URINE: NEGATIVE
Cellular Cast, UA: 5827
Glucose, UA: NEGATIVE mg/dL
Glucose, UA: NEGATIVE mg/dL
Ketones, ur: 5 mg/dL — AB
Ketones, ur: 5 mg/dL — AB
NITRITE: NEGATIVE
Nitrite: NEGATIVE
PROTEIN: 100 mg/dL — AB
Protein, ur: 100 mg/dL — AB
SPECIFIC GRAVITY, URINE: 1.018 (ref 1.005–1.030)
SPECIFIC GRAVITY, URINE: 1.018 (ref 1.005–1.030)
pH: 7 (ref 5.0–8.0)
pH: 7 (ref 5.0–8.0)

## 2017-02-24 LAB — CBC WITH DIFFERENTIAL/PLATELET
BASOS ABS: 0 10*3/uL (ref 0.0–0.1)
BASOS PCT: 0 %
EOS PCT: 2 %
Eosinophils Absolute: 0.2 10*3/uL (ref 0.0–0.7)
HCT: 38.1 % (ref 36.0–46.0)
Hemoglobin: 12.4 g/dL (ref 12.0–15.0)
LYMPHS PCT: 12 %
Lymphs Abs: 1.4 10*3/uL (ref 0.7–4.0)
MCH: 28.4 pg (ref 26.0–34.0)
MCHC: 32.5 g/dL (ref 30.0–36.0)
MCV: 87.4 fL (ref 78.0–100.0)
Monocytes Absolute: 1.3 10*3/uL — ABNORMAL HIGH (ref 0.1–1.0)
Monocytes Relative: 11 %
Neutro Abs: 8.7 10*3/uL — ABNORMAL HIGH (ref 1.7–7.7)
Neutrophils Relative %: 75 %
PLATELETS: 306 10*3/uL (ref 150–400)
RBC: 4.36 MIL/uL (ref 3.87–5.11)
RDW: 14.6 % (ref 11.5–15.5)
WBC: 11.6 10*3/uL — AB (ref 4.0–10.5)

## 2017-02-24 LAB — I-STAT CG4 LACTIC ACID, ED
LACTIC ACID, VENOUS: 1.13 mmol/L (ref 0.5–1.9)
Lactic Acid, Venous: 0.98 mmol/L (ref 0.5–1.9)

## 2017-02-24 LAB — CBG MONITORING, ED: Glucose-Capillary: 97 mg/dL (ref 65–99)

## 2017-02-24 MED ORDER — LEVOTHYROXINE SODIUM 150 MCG PO TABS
150.0000 ug | ORAL_TABLET | Freq: Every day | ORAL | Status: DC
  Administered 2017-02-26 – 2017-03-05 (×8): 150 ug via ORAL
  Filled 2017-02-24 (×8): qty 1

## 2017-02-24 MED ORDER — ADULT MULTIVITAMIN W/MINERALS CH
1.0000 | ORAL_TABLET | Freq: Every day | ORAL | Status: DC
  Administered 2017-02-26 – 2017-03-05 (×8): 1 via ORAL
  Filled 2017-02-24 (×8): qty 1

## 2017-02-24 MED ORDER — ZINC OXIDE 20 % EX OINT
TOPICAL_OINTMENT | Freq: Three times a day (TID) | CUTANEOUS | Status: DC
  Administered 2017-02-24: 21:00:00 via TOPICAL
  Administered 2017-02-24: 1 via TOPICAL
  Administered 2017-02-25 – 2017-02-27 (×7): via TOPICAL
  Administered 2017-02-27: 1 via TOPICAL
  Administered 2017-02-28: 10:00:00 via TOPICAL
  Administered 2017-02-28: 1 via TOPICAL
  Administered 2017-03-01 – 2017-03-03 (×9): via TOPICAL
  Administered 2017-03-04: 1 via TOPICAL
  Administered 2017-03-04 (×2): via TOPICAL
  Filled 2017-02-24: qty 28.35

## 2017-02-24 MED ORDER — ATORVASTATIN CALCIUM 10 MG PO TABS
20.0000 mg | ORAL_TABLET | Freq: Every day | ORAL | Status: DC
  Administered 2017-02-25 – 2017-03-04 (×7): 20 mg via ORAL
  Filled 2017-02-24 (×7): qty 2

## 2017-02-24 MED ORDER — SODIUM CHLORIDE 0.9 % IV BOLUS (SEPSIS)
500.0000 mL | Freq: Once | INTRAVENOUS | Status: AC
  Administered 2017-02-24: 500 mL via INTRAVENOUS

## 2017-02-24 MED ORDER — CLOPIDOGREL BISULFATE 75 MG PO TABS
75.0000 mg | ORAL_TABLET | Freq: Every day | ORAL | Status: DC
  Administered 2017-02-27 – 2017-03-05 (×7): 75 mg via ORAL
  Filled 2017-02-24 (×8): qty 1

## 2017-02-24 MED ORDER — DEXTROSE 5 % IV SOLN
1.0000 g | Freq: Every day | INTRAVENOUS | Status: DC
  Administered 2017-02-24 – 2017-02-25 (×2): 1 g via INTRAVENOUS
  Filled 2017-02-24 (×3): qty 10

## 2017-02-24 MED ORDER — SODIUM CHLORIDE 0.9 % IV SOLN
INTRAVENOUS | Status: AC
  Administered 2017-02-24: 18:00:00 via INTRAVENOUS

## 2017-02-24 MED ORDER — RISAQUAD PO CAPS
1.0000 | ORAL_CAPSULE | Freq: Every day | ORAL | Status: DC
  Administered 2017-02-27 – 2017-03-05 (×7): 1 via ORAL
  Filled 2017-02-24 (×8): qty 1

## 2017-02-24 MED ORDER — ACETAMINOPHEN 325 MG PO TABS
650.0000 mg | ORAL_TABLET | Freq: Four times a day (QID) | ORAL | Status: DC | PRN
  Administered 2017-02-26 – 2017-03-05 (×6): 650 mg via ORAL
  Filled 2017-02-24 (×8): qty 2

## 2017-02-24 MED ORDER — ONDANSETRON HCL 4 MG/2ML IJ SOLN: 4.0000 mg | Freq: Four times a day (QID) | INTRAMUSCULAR | Status: DC | PRN

## 2017-02-24 MED ORDER — ENSURE ENLIVE PO LIQD
237.0000 mL | Freq: Two times a day (BID) | ORAL | Status: DC
  Administered 2017-02-25 – 2017-03-04 (×9): 237 mL via ORAL

## 2017-02-24 MED ORDER — ENOXAPARIN SODIUM 40 MG/0.4ML ~~LOC~~ SOLN
40.0000 mg | SUBCUTANEOUS | Status: DC
  Administered 2017-02-24 – 2017-03-01 (×6): 40 mg via SUBCUTANEOUS
  Filled 2017-02-24 (×6): qty 0.4

## 2017-02-24 MED ORDER — ACETAMINOPHEN 650 MG RE SUPP
650.0000 mg | Freq: Four times a day (QID) | RECTAL | Status: DC | PRN
Start: 2017-02-24 — End: 2017-03-05
  Administered 2017-02-24: 650 mg via RECTAL
  Filled 2017-02-24: qty 1

## 2017-02-24 MED ORDER — ONDANSETRON HCL 4 MG PO TABS: 4.0000 mg | ORAL_TABLET | Freq: Four times a day (QID) | ORAL | Status: DC | PRN

## 2017-02-24 MED ORDER — SERTRALINE HCL 50 MG PO TABS
50.0000 mg | ORAL_TABLET | Freq: Every day | ORAL | Status: DC
  Administered 2017-02-26 – 2017-03-05 (×8): 50 mg via ORAL
  Filled 2017-02-24 (×8): qty 1

## 2017-02-24 MED ORDER — VANCOMYCIN HCL IN DEXTROSE 1-5 GM/200ML-% IV SOLN
1000.0000 mg | INTRAVENOUS | Status: DC
  Administered 2017-02-25: 1000 mg via INTRAVENOUS
  Filled 2017-02-24: qty 200

## 2017-02-24 MED ORDER — CEFAZOLIN IN D5W 1 GM/50ML IV SOLN
1.0000 g | Freq: Once | INTRAVENOUS | Status: AC
  Administered 2017-02-24: 1 g via INTRAVENOUS
  Filled 2017-02-24: qty 50

## 2017-02-24 MED ORDER — VANCOMYCIN HCL IN DEXTROSE 1-5 GM/200ML-% IV SOLN
1000.0000 mg | Freq: Once | INTRAVENOUS | Status: AC
  Administered 2017-02-24: 1000 mg via INTRAVENOUS
  Filled 2017-02-24: qty 200

## 2017-02-24 MED ORDER — ZINC OXIDE 13 % EX CREA: 1.0000 "application " | TOPICAL_CREAM | Freq: Three times a day (TID) | CUTANEOUS | Status: DC

## 2017-02-24 NOTE — ED Notes (Signed)
EDPA Provider at bedside. 

## 2017-02-24 NOTE — ED Notes (Signed)
Attempt made to call report to floor. Nurse unavailable to take report at this time.

## 2017-02-24 NOTE — ED Notes (Signed)
Patient transported to CT 

## 2017-02-24 NOTE — H&P (Addendum)
History and Physical    [name] HYI:[id number] DOB: 07-15-1931 DOA: 02/24/2017    PCP: [name], MD  Patient coming from: ALF  Chief Complaint: sent for lethargy  HPI: [name] is a 83 y.o. female with medical history of Dementia with baseline confusion, CVA, U retention with chronic foley, frequent UTIs and hypothyroidism presents from ALF for lethargy.  Last admitted in mid march with acute confusion, slurred speech thought to be due to a UTI. Unable to give me a history.   ED Course:  Foley changed in ER CT head negative Temp 99.1 WBC 11 UA + (has foley) with WBC clumps and RNTC RBC and WBC.   Review of Systems:  Unable to obtain All other systems reviewed and apart from HPI, are negative.  Past Medical History:  Diagnosis Date  . Alcohol abuse   . Alzheimers disease   . Arthritis   . Chronic indwelling Foley catheter   . Depression   . Depression   . Insomnia   . MRSA (methicillin resistant staph aureus) culture positive   . Stroke ([place])   . Thyroid disease    hypothyroid  . TIA (transient ischemic attack)   . UTI (urinary tract infection)     Past Surgical History:  Procedure Laterality Date  . ABDOMINAL HYSTERECTOMY     partial  . CHOLECYSTECTOMY  2008  . THYROID SURGERY    . TONSILLECTOMY AND ADENOIDECTOMY    . TOTAL HIP ARTHROPLASTY Left 09/24/2014   Procedure: TOTAL HIP ARTHROPLASTY ANTERIOR APPROACH, LEFT;  Surgeon: Mcarthur Rossetti, MD;  Location: [facility];  Service: Orthopedics;  Laterality: Left;    Social History:   reports that she has never smoked. She has never used smokeless tobacco. She reports that she does not drink alcohol or use drugs.   Lives in memory care unit.  Allergies  Allergen Reactions  . Ciprofloxacin Rash  . Citalopram Hydrobromide Other (See Comments)    Shaky inside  . Sulfa Antibiotics Rash    Family History  Problem Relation Age of Onset  . Arthritis Mother   . Hyperlipidemia Mother   .  Stroke Mother   . Hypertension Mother   . Mental illness Mother   . Cancer Father     lung  . Cancer Sister     ovarian/uterine  . Stroke Sister   . Hypertension Sister   . Hypertension Brother   . Diabetes Brother   . Cancer Daughter     breast     Prior to Admission medications   Medication Sig Start Date End Date Taking? Authorizing Provider  atorvastatin (LIPITOR) 20 MG tablet Take 1 tablet (20 mg total) by mouth daily at 6 PM. 05/19/16  Yes Eugenie Filler, MD  clopidogrel (PLAVIX) 75 MG tablet Take 75 mg by mouth daily.   Yes Historical Provider, MD  feeding supplement, ENSURE ENLIVE, (ENSURE ENLIVE) LIQD Take 237 mLs by mouth 2 (two) times daily between meals. 02/06/17  Yes Patrecia Pour, MD  levothyroxine (SYNTHROID, LEVOTHROID) 150 MCG tablet Take 150 mcg by mouth daily before breakfast.   Yes Historical Provider, MD  miconazole (MICOTIN) 2 % cream Apply 1 application topically 2 (two) times daily.   Yes Historical Provider, MD  Multiple Vitamin (MULTIVITAMIN) tablet Take 1 tablet by mouth daily.   Yes Historical Provider, MD  Probiotic Product (ACIDOPHILUS/GOAT MILK) CAPS Take 1 capsule by mouth daily.    Yes Historical Provider, MD  sertraline (ZOLOFT) 50 MG  tablet Take 50 mg by mouth daily.    Yes Historical Provider, MD  Zinc Oxide (DESITIN) 13 % CREA Apply 1 application topically 3 (three) times daily.   Yes Historical Provider, MD    Physical Exam: Vitals:   02/24/17 1014 02/24/17 1327 02/24/17 1438 02/24/17 1600  BP:  (!) 136/103 123/77 (!) 156/115  Pulse:  94 89 99  Resp:  14 15 18   Temp: 98 F (36.7 C)   99.1 F (37.3 C)  TempSrc: Rectal   Axillary  SpO2:  95% 97% 96%  Weight:      Height:          Constitutional: mildly agitated when examined. Eyes: will not allow me to open eyes ENMT: Mucous membranes are moist. Posterior pharynx clear of any exudate or lesions. Normal dentition.  Neck: normal, supple, no masses, no thyromegaly Respiratory: clear to  auscultation bilaterally, no wheezing, no crackles. Normal respiratory effort. No accessory muscle use.  Cardiovascular: S1 & S2 heard, regular rate and rhythm, no murmurs / rubs / gallops. No extremity edema. 2+ pedal pulses. No carotid bruits.  Abdomen: No distension, no tenderness, no masses palpated. No hepatosplenomegaly. Bowel sounds normal.  Musculoskeletal: no clubbing / cyanosis. No joint deformity upper and lower extremities. Good ROM, no contractures. Normal muscle tone.  Skin: no rashes, lesions, ulcers. No induration Neurologic: CN 2-12 grossly intact. Moving all 4 extremities.  Psychiatric: confused and irritable     Labs on Admission: I have personally reviewed following labs and imaging studies  CBC:  Recent Labs Lab 02/24/17 1020  WBC 11.6*  NEUTROABS 8.7*  HGB 12.4  HCT 38.1  MCV 87.4  PLT 720   Basic Metabolic Panel:  Recent Labs Lab 02/24/17 1024  NA 141  K 3.9  CL 108  CO2 24  GLUCOSE 122*  BUN 17  CREATININE 0.61  CALCIUM 9.0   GFR: Estimated Creatinine Clearance: 44.8 mL/min (by C-G formula based on SCr of 0.61 mg/dL). Liver Function Tests:  Recent Labs Lab 02/24/17 1024  AST 19  ALT 13*  ALKPHOS 92  BILITOT 0.9  PROT 6.9  ALBUMIN 3.5   No results for input(s): LIPASE, AMYLASE in the last 168 hours. No results for input(s): AMMONIA in the last 168 hours. Coagulation Profile: No results for input(s): INR, PROTIME in the last 168 hours. Cardiac Enzymes: No results for input(s): CKTOTAL, CKMB, CKMBINDEX, TROPONINI in the last 168 hours. BNP (last 3 results) No results for input(s): PROBNP in the last 8760 hours. HbA1C: No results for input(s): HGBA1C in the last 72 hours. CBG:  Recent Labs Lab 02/24/17 1007  GLUCAP 97   Lipid Profile: No results for input(s): CHOL, HDL, LDLCALC, TRIG, CHOLHDL, LDLDIRECT in the last 72 hours. Thyroid Function Tests: No results for input(s): TSH, T4TOTAL, FREET4, T3FREE, THYROIDAB in the last  72 hours. Anemia Panel: No results for input(s): VITAMINB12, FOLATE, FERRITIN, TIBC, IRON, RETICCTPCT in the last 72 hours. Urine analysis:    Component Value Date/Time   COLORURINE YELLOW 02/24/2017 1132   COLORURINE YELLOW 02/24/2017 1132   APPEARANCEUR TURBID (A) 02/24/2017 1132   APPEARANCEUR TURBID (A) 02/24/2017 1132   LABSPEC 1.018 02/24/2017 1132   LABSPEC 1.018 02/24/2017 1132   PHURINE 7.0 02/24/2017 1132   PHURINE 7.0 02/24/2017 1132   GLUCOSEU NEGATIVE 02/24/2017 1132   GLUCOSEU NEGATIVE 02/24/2017 1132   HGBUR SMALL (A) 02/24/2017 1132   HGBUR SMALL (A) 02/24/2017 1132   BILIRUBINUR NEGATIVE 02/24/2017 1132   BILIRUBINUR  NEGATIVE 02/24/2017 1132   BILIRUBINUR 1+ 01/05/2015 1145   KETONESUR 5 (A) 02/24/2017 1132   KETONESUR 5 (A) 02/24/2017 1132   PROTEINUR 100 (A) 02/24/2017 1132   PROTEINUR 100 (A) 02/24/2017 1132   UROBILINOGEN 0.2 01/05/2015 1145   UROBILINOGEN 1.0 09/26/2014 1802   NITRITE NEGATIVE 02/24/2017 1132   NITRITE NEGATIVE 02/24/2017 1132   LEUKOCYTESUR SMALL (A) 02/24/2017 1132   LEUKOCYTESUR SMALL (A) 02/24/2017 1132   Sepsis Labs: @LABRCNTIP (procalcitonin:4,lacticidven:4) )No results found for this or any previous visit (from the past 240 hour(s)).   Radiological Exams on Admission: Ct Head Wo Contrast  Result Date: 02/24/2017 CLINICAL DATA:  Altered mental status, unresponsive EXAM: CT HEAD WITHOUT CONTRAST TECHNIQUE: Contiguous axial images were obtained from the base of the skull through the vertex without intravenous contrast. COMPARISON:  MRI brain dated 02/03/2017 FINDINGS: Brain: No evidence of acute infarction, hemorrhage, hydrocephalus, extra-axial collection or mass lesion/mass effect. Subcortical white matter and periventricular small vessel ischemic changes. Mild global cortical atrophy. Vascular: Intracranial atherosclerosis. Skull: Normal. Negative for fracture or focal lesion. Sinuses/Orbits: The visualized paranasal sinuses are  essentially clear. The mastoid air cells are unopacified. Other: None. IMPRESSION: No evidence of acute intracranial abnormality. Atrophy with small vessel ischemic changes. Electronically Signed   By: Julian Hy M.D.   On: 02/24/2017 10:54   Dg Chest Port 1 View  Result Date: 02/24/2017 CLINICAL DATA:  Patient is unresponsive. EXAM: PORTABLE CHEST 1 VIEW COMPARISON:  February 03, 2017 FINDINGS: The heart, hila, mediastinum, lungs, and pleura are unremarkable. IMPRESSION: No active disease. Electronically Signed   By: Dorise Bullion III M.D   On: 02/24/2017 14:11    EKG: Independently reviewed. Sinus rhythm with RBBB  Assessment/Plan Principal Problem:   Acute encephalopathy - possibly from UTI again- just recovered from one   Active Problems:   Urinary retention - foley has been in for 2 yrs - cont foley which was changed today - daughter is requesting a urology consult. She was told in the past that a suprapubic catheter would be an option - I will request a consult- I spoke with Urology, Cleotis Lema, who asked me to have someone call the on call Urologist tomorrow morning for this non-urgent issue. - Given Keflex and Vanc in ER- MRSA PCR was + last time - will switch Keflex to Rocephin - cont Vanc - UA and blood cultures sent    Hypothyroid - Synthroid    TIA (transient ischemic attack) - Plavix, Lipitor   DVT prophylaxis: Lovenox Code Status: Full code -   Family Communication:  Val Riles- who is not the main caretaker- her daughter who is the main caretaker is in [place] and will be returning by tomorrow- Lequita Asal- numbers on Face sheet.  Disposition Plan: med/surg  Consults called:   Admission status: inpatient    Debbe Odea MD [facility] Pager: [email or web address] Password [email or web address] 7PM-7AM, please contact night-coverage   02/24/2017, 4:30 PM

## 2017-02-24 NOTE — ED Provider Notes (Signed)
[facility] DEPT Provider Note   CSN: [id number] Arrival date & time: 02/24/17  8250     History   Chief Complaint Chief Complaint  Patient presents with  . Altered Mental Status    HPI [name] is a 83 y.o. female who presents with AMS. PMH significant for advanced dementia, hx of CVA (most recently 04/2016), hypothyroidism, skin breakdown, and hx of urinary retentionwith indwelling catheter. She was recently admitted at the end of March due to UTI. She was discharged with Urology, Neurology, and PCP follow up which she has not done yet. She lives at [facility] in the memory care unit. Son-in-law is at bedside and provides history but states he only sees the patient about once a week. He states normally she is confused but is conversive. This morning the nursing home called him and told him that she was unresponsive and not at her baseline. Her foley catheter was not draining however when they repositioned her it started draining again. She had one hard BM on arrival to ED.  Son-in-law she is presenting similarly to when she was admitted several weeks ago. Level 5 caveat due to mental status.  HPI  Past Medical History:  Diagnosis Date  . Alcohol abuse   . Arthritis   . Depression   . Stroke ([place])   . Thyroid disease    hypothyroid    Patient Active Problem List   Diagnosis Date Noted  . Pressure injury of skin 02/03/2017  . Confusion 02/02/2017  . CVA (cerebral vascular accident) ([place]) 05/18/2016  . B12 deficiency 05/18/2016  . Encephalopathy acute 05/17/2016  . Encephalopathy 05/17/2016  . Acute UTI 05/17/2016  . Leukocytosis 05/17/2016  . Dehydration 05/17/2016  . H/O: CVA (cerebrovascular accident) 05/17/2016  . UTI (urinary tract infection) due to urinary indwelling Foley catheter ([place]) 05/17/2016  . Acute encephalopathy 05/17/2016  . Postoperative anemia due to acute blood loss 09/28/2014  . Urinary retention 09/28/2014  . Infection of urinary tract  09/28/2014  . Severe dementia 09/28/2014  . Left displaced femoral neck fracture ([place]) 09/23/2014  . Vascular dementia 09/17/2014  . 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 Essential hypertension, benign 07/22/2013  . Acute posthemorrhagic anemia 07/22/2013  . Anemia 07/03/2013  . Constipation 07/03/2013  . Insomnia 07/03/2013  . Recurrent UTI 03/27/2013  . Preoperative evaluation to rule out surgical contraindication 12/24/2012  . Cognitive impairment 04/02/2012  . Osteoarthritis of knee 03/12/2011  . Hypothyroid 02/26/2011  . TIA (transient ischemic attack) 02/26/2011  . Depression 02/26/2011  . Hyperlipemia 02/26/2011  . Alcohol abuse 02/26/2011    Past Surgical History:  Procedure Laterality Date  . ABDOMINAL HYSTERECTOMY     partial  . CHOLECYSTECTOMY  2008  . THYROID SURGERY    . TONSILLECTOMY AND ADENOIDECTOMY    . TOTAL HIP ARTHROPLASTY Left 09/24/2014   Procedure: TOTAL HIP ARTHROPLASTY ANTERIOR APPROACH, LEFT;  Surgeon: Mcarthur Rossetti, MD;  Location: [facility];  Service: Orthopedics;  Laterality: Left;    OB History    No data available       Home Medications    Prior to Admission medications   Medication Sig Start Date End Date Taking? Authorizing Provider  acetaminophen (TYLENOL) 325 MG tablet Take 325 mg by mouth every 6 (six) hours as needed for mild pain (pain).     Historical Provider, MD  Amino Acids-Protein Hydrolys (FEEDING SUPPLEMENT, PRO-STAT SUGAR FREE 64,) LIQD Take 30 mLs by mouth at bedtime. 02/06/17   Patrecia Pour,  MD  atorvastatin (LIPITOR) 20 MG tablet Take 1 tablet (20 mg total) by mouth daily at 6 PM. 05/19/16   Eugenie Filler, MD  cephALEXin (KEFLEX) 500 MG capsule Take 1 capsule (500 mg total) by mouth every 12 (twelve) hours. 02/06/17   Patrecia Pour, MD  clopidogrel (PLAVIX) 75 MG tablet Take 75 mg by mouth daily.    Historical Provider, MD  feeding supplement, ENSURE ENLIVE, (ENSURE ENLIVE) LIQD Take 237 mLs by mouth 2 (two) times  daily between meals. 02/06/17   Patrecia Pour, MD  levothyroxine (SYNTHROID, LEVOTHROID) 150 MCG tablet Take 150 mcg by mouth daily before breakfast.    Historical Provider, MD  miconazole (MICOTIN) 2 % cream Apply 1 application topically 2 (two) times daily.    Historical Provider, MD  Multiple Vitamin (MULTIVITAMIN) tablet Take 1 tablet by mouth daily.    Historical Provider, MD  nystatin cream (MYCOSTATIN) Apply 1 application topically 2 (two) times daily.    Historical Provider, MD  Probiotic Product (ACIDOPHILUS/GOAT MILK) CAPS Take 1 capsule by mouth daily.     Historical Provider, MD  sertraline (ZOLOFT) 25 MG tablet Take 25 mg by mouth daily.    Historical Provider, MD    Family History Family History  Problem Relation Age of Onset  . Arthritis Mother   . Hyperlipidemia Mother   . Stroke Mother   . Hypertension Mother   . Mental illness Mother   . Cancer Father     lung  . Cancer Sister     ovarian/uterine  . Stroke Sister   . Hypertension Sister   . Hypertension Brother   . Diabetes Brother   . Cancer Daughter     breast    Social History Social History  Substance Use Topics  . Smoking status: Never Smoker  . Smokeless tobacco: Never Used  . Alcohol use No     Comment: used to drink      Allergies   Ciprofloxacin; Citalopram hydrobromide; and Sulfa antibiotics   Review of Systems Review of Systems  Unable to perform ROS: Dementia     Physical Exam Updated Vital Signs BP 139/72   Pulse 89   Temp 98 F (36.7 C) (Rectal)   Resp 14   Ht 5\' 2"  (1.575 m)   Wt 60.3 kg   SpO2 92%   BMI 24.33 kg/m   Physical Exam  Constitutional: She appears well-developed and well-nourished. She appears lethargic. No distress.  Resting comfortably. Occasional mumbling  HENT:  Head: Normocephalic and atraumatic.  Eyes:  Unable to assess. Pt resists eye opening  Neck: Normal range of motion.  Cardiovascular: Normal rate and regular rhythm.  Exam reveals no gallop and  no friction rub.   No murmur heard. Pulmonary/Chest: Effort normal and breath sounds normal. No respiratory distress. She has no wheezes. She has no rales. She exhibits no tenderness.  Abdominal: Soft. Bowel sounds are normal. She exhibits no distension. There is no tenderness.  Genitourinary:  Genitourinary Comments: Foley is draining yellow colored urine  Neurological: She appears lethargic. She is disoriented. GCS eye subscore is 3. GCS verbal subscore is 3. GCS motor subscore is 5.  Unable to assess  Skin: Skin is warm and dry.  Erythema noted on buttocks. No open wounds  Psychiatric: She has a normal mood and affect. Her behavior is normal.  Nursing note and vitals reviewed.    ED Treatments / Results  Labs (all labs ordered are listed, but only abnormal results  are displayed) Labs Reviewed  COMPREHENSIVE METABOLIC PANEL - Abnormal; Notable for the following:       Result Value   Glucose, Bld 122 (*)    ALT 13 (*)    All other components within normal limits  URINALYSIS, ROUTINE W REFLEX MICROSCOPIC - Abnormal; Notable for the following:    APPearance TURBID (*)    Hgb urine dipstick SMALL (*)    Ketones, ur 5 (*)    Protein, ur 100 (*)    Leukocytes, UA SMALL (*)    All other components within normal limits  CBC WITH DIFFERENTIAL/PLATELET - Abnormal; Notable for the following:    WBC 11.6 (*)    Neutro Abs 8.7 (*)    Monocytes Absolute 1.3 (*)    All other components within normal limits  URINALYSIS, ROUTINE W REFLEX MICROSCOPIC - Abnormal; Notable for the following:    APPearance TURBID (*)    Hgb urine dipstick SMALL (*)    Ketones, ur 5 (*)    Protein, ur 100 (*)    Leukocytes, UA SMALL (*)    Bacteria, UA MANY (*)    Squamous Epithelial / LPF 0-5 (*)    All other components within normal limits  CULTURE, BLOOD (ROUTINE X 2)  CULTURE, BLOOD (ROUTINE X 2)  URINE CULTURE  CBG MONITORING, ED  I-STAT CG4 LACTIC ACID, ED  I-STAT CG4 LACTIC ACID, ED  I-STAT CG4  LACTIC ACID, ED    EKG  EKG Interpretation None       Radiology Ct Head Wo Contrast  Result Date: 02/24/2017 CLINICAL DATA:  Altered mental status, unresponsive EXAM: CT HEAD WITHOUT CONTRAST TECHNIQUE: Contiguous axial images were obtained from the base of the skull through the vertex without intravenous contrast. COMPARISON:  MRI brain dated 02/03/2017 FINDINGS: Brain: No evidence of acute infarction, hemorrhage, hydrocephalus, extra-axial collection or mass lesion/mass effect. Subcortical white matter and periventricular small vessel ischemic changes. Mild global cortical atrophy. Vascular: Intracranial atherosclerosis. Skull: Normal. Negative for fracture or focal lesion. Sinuses/Orbits: The visualized paranasal sinuses are essentially clear. The mastoid air cells are unopacified. Other: None. IMPRESSION: No evidence of acute intracranial abnormality. Atrophy with small vessel ischemic changes. Electronically Signed   By: Julian Hy M.D.   On: 02/24/2017 10:54    Procedures Procedures (including critical care time)  Medications Ordered in ED Medications  vancomycin (VANCOCIN) IVPB 1000 mg/200 mL premix (1,000 mg Intravenous New Bag/Given 02/24/17 1457)  vancomycin (VANCOCIN) IVPB 1000 mg/200 mL premix (not administered)  sodium chloride 0.9 % bolus 500 mL (0 mLs Intravenous Stopped 02/24/17 1303)  ceFAZolin (ANCEF) IVPB 1 g/50 mL premix (0 g Intravenous Stopped 02/24/17 1450)     Initial Impression / Assessment and Plan / ED Course  I have reviewed the triage vital signs and the nursing notes.  Pertinent labs & imaging results that were available during my care of the patient were reviewed by me and considered in my medical decision making (see chart for details).  83 year old female with AMS due possibly to UTI. She is mildly hypertensive, otherwise vitals are normal. CBC remarkable for leukocytosis of 11.6. CMP remarkable for mild hyperglycemia. Lactic acid normal. UA  remarkable for many bacteria, small leukocytosis, TNTC RBC, TNTC WBC. Fluids and antibiotics started. EKG has a lot of artifact but is grossly normal. CT head negative. CXR negative. Will admit for acute encephalopathy. Spoke to Dr. Wynelle Cleveland who will admit.  Final Clinical Impressions(s) / ED Diagnoses   Final diagnoses:  Delirium  Urinary tract infection associated with catheterization of urinary tract, unspecified indwelling urinary catheter type, initial encounter [facility])    New Prescriptions New Prescriptions   No medications on file     Recardo Evangelist, PA-C 02/24/17 1511    Leo Grosser, MD 02/25/17 [phone]

## 2017-02-24 NOTE — ED Notes (Signed)
Bed: GE36 Expected date:  Expected time:  Means of arrival:  Comments:  83 yo UTI?

## 2017-02-24 NOTE — Progress Notes (Signed)
Pharmacy Antibiotic Note  [name] is a 83 y.o. female admitted on 02/24/2017 with UTI.  Pharmacy has been consulted for Vancomycin dosing.  Plan: Vancomycin 1g IV every 24 hours.  Goal trough 10-15 mcg/mL.  Height: 5\' 2"  (157.5 cm) Weight: 133 lb (60.3 kg) IBW/kg (Calculated) : 50.1  Temp (24hrs), Avg:98.2 F (36.8 C), Min:98 F (36.7 C), Max:98.4 F (36.9 C)   Recent Labs Lab 02/24/17 1020 02/24/17 1024 02/24/17 1035 02/24/17 1309  WBC 11.6*  --   --   --   CREATININE  --  0.61  --   --   LATICACIDVEN  --   --  1.13 0.98    Estimated Creatinine Clearance: 44.8 mL/min (by C-G formula based on SCr of 0.61 mg/dL).    Allergies  Allergen Reactions  . Ciprofloxacin Rash  . Citalopram Hydrobromide Other (See Comments)    Shaky inside  . Sulfa Antibiotics Rash     Thank you for allowing pharmacy to be a part of this patient's care.   Adrian Saran, PharmD, BCPS Pager [phone] 02/24/2017 2:07 PM

## 2017-02-24 NOTE — ED Notes (Signed)
EDPA STATED NO BREAKDOWN  OR WOUNDS NOTED

## 2017-02-24 NOTE — ED Triage Notes (Signed)
Per [facility]- Pt resides at [facility] Per staff pt was unresponsive to voice and touch lasting 30 minutes. Pt was spontaneous with eye opening however not aware of current stimulation. EMS states pt oriented to self. Per staff, pt has not returned to baseline. NEGATIVE FOR STROKE. Treated several weeks ago for UTI and chronic catheter use. Denies N/V/D and fever

## 2017-02-25 ENCOUNTER — Inpatient Hospital Stay (HOSPITAL_COMMUNITY): Payer: Medicare HMO

## 2017-02-25 DIAGNOSIS — Z7189 Other specified counseling: Secondary | ICD-10-CM

## 2017-02-25 DIAGNOSIS — F0390 Unspecified dementia without behavioral disturbance: Secondary | ICD-10-CM

## 2017-02-25 DIAGNOSIS — Z515 Encounter for palliative care: Secondary | ICD-10-CM

## 2017-02-25 LAB — CBC
HEMATOCRIT: 35 % — AB (ref 36.0–46.0)
HEMOGLOBIN: 11.2 g/dL — AB (ref 12.0–15.0)
MCH: 28.4 pg (ref 26.0–34.0)
MCHC: 32 g/dL (ref 30.0–36.0)
MCV: 88.6 fL (ref 78.0–100.0)
Platelets: 272 10*3/uL (ref 150–400)
RBC: 3.95 MIL/uL (ref 3.87–5.11)
RDW: 14.8 % (ref 11.5–15.5)
WBC: 7.9 10*3/uL (ref 4.0–10.5)

## 2017-02-25 LAB — BASIC METABOLIC PANEL
ANION GAP: 6 (ref 5–15)
BUN: 11 mg/dL (ref 6–20)
CALCIUM: 8.4 mg/dL — AB (ref 8.9–10.3)
CHLORIDE: 110 mmol/L (ref 101–111)
CO2: 24 mmol/L (ref 22–32)
Creatinine, Ser: 0.47 mg/dL (ref 0.44–1.00)
GFR calc non Af Amer: >60 mL/min (ref >60)
GLUCOSE: 98 mg/dL (ref 65–99)
POTASSIUM: 3.7 mmol/L (ref 3.5–5.1)
Sodium: 140 mmol/L (ref 135–145)

## 2017-02-25 NOTE — Evaluation (Signed)
Clinical/Bedside Swallow Evaluation Patient Details  Name: [name] MRN: [id number] Date of Birth: November 23, 1930  Today's Date: 02/25/2017 Time: SLP Start Time (ACUTE ONLY): 1213 SLP Stop Time (ACUTE ONLY): 1233 SLP Time Calculation (min) (ACUTE ONLY): 20 min  Past Medical History:  Past Medical History:  Diagnosis Date  . Alcohol abuse   . Alzheimers disease   . Arthritis   . Chronic indwelling Foley catheter   . Depression   . Depression   . Insomnia   . MRSA (methicillin resistant staph aureus) culture positive   . Parkinson disease ([place])   . Stroke ([place])   . Thyroid disease    hypothyroid  . TIA (transient ischemic attack)   . UTI (urinary tract infection)    Past Surgical History:  Past Surgical History:  Procedure Laterality Date  . ABDOMINAL HYSTERECTOMY     partial  . CHOLECYSTECTOMY  2008  . THYROID SURGERY    . TONSILLECTOMY AND ADENOIDECTOMY    . TOTAL HIP ARTHROPLASTY Left 09/24/2014   Procedure: TOTAL HIP ARTHROPLASTY ANTERIOR APPROACH, LEFT;  Surgeon: Mcarthur Rossetti, MD;  Location: [facility];  Service: Orthopedics;  Laterality: Left;   HPI:  [name] an 83 y.o.femalewith medical history of dementia with baseline confusion, CVA, U retention with chronic foley, frequent UTIs and hypothyroidism presents from ALF for lethargy.  Last admitted in mid-March with acute confusion, slurred speech thought to be due to a UTI. CXR showed no active disease. CT of head showed no evidence of acute intracranial abnormality. Atrophy with small vessel ischemic changes.   Assessment / Plan / Recommendation Clinical Impression  [name] was alert, however confused (yelling "help") during bedside swallow evaluation. A full oral motor assessment was not completed due to pt's mentation, confusion, and difficulty following commands (hx of dementia). Mastication of regular solids and transit of Dys 1 solids was timely and efficient with no obvious oral phase impairments with  these textures. No overt s/s of airway compromise at bedside with straw sips of thin liquids, although suspect intermittent delayed swallow initiation with thin liquids. Given pt's mentation and cognition, recommend continuing Dys 2 solids, thin liquids (straws ok), meds crushed. ST will f/u briefly to assess diet tolerance and faciliate safety/efficiency of swallow mechanism.    SLP Visit Diagnosis: Dysphagia, unspecified (R13.10)    Aspiration Risk  Mild aspiration risk    Diet Recommendation Dysphagia 2 (Fine chop);Thin liquid   Liquid Administration via: Cup;Straw Medication Administration: Crushed with puree Supervision: Staff to assist with self feeding Compensations: Minimize environmental distractions;Slow rate;Small sips/bites Postural Changes: Seated upright at 90 degrees    Other  Recommendations Oral Care Recommendations: Oral care BID   Follow up Recommendations Skilled Nursing facility      Frequency and Duration min 2x/week  2 weeks       Prognosis Prognosis for Safe Diet Advancement: Good Barriers to Reach Goals: Cognitive deficits      Swallow Study   General HPI: [name] an 83 y.o.femalewith medical history of dementia with baseline confusion, CVA, U retention with chronic foley, frequent UTIs and hypothyroidism presents from ALF for lethargy.  Last admitted in mid-March with acute confusion, slurred speech thought to be due to a UTI. CXR showed no active disease. CT of head showed no evidence of acute intracranial abnormality. Atrophy with small vessel ischemic changes. Type of Study: Bedside Swallow Evaluation Previous Swallow Assessment: none noted Diet Prior to this Study: Dysphagia 2 (chopped);Thin liquids Temperature Spikes Noted: No Respiratory Status: Room  air History of Recent Intubation: No Behavior/Cognition: Alert;Confused;Requires cueing Oral Cavity Assessment: Other (comment) (unable to view oral cavity due to mentation) Oral Care Completed by  SLP: No Oral Cavity - Dentition: Other (Comment) (difficult to assess due to limited oral cavity assessment) Vision: Functional for self-feeding Self-Feeding Abilities: Needs assist;Needs set up Patient Positioning: Upright in bed Baseline Vocal Quality: Normal Volitional Cough: Cognitively unable to elicit Volitional Swallow: Unable to elicit    Oral/Motor/Sensory Function Overall Oral Motor/Sensory Function: Other (comment) (unable to assess due to mentation)   Ice Chips Ice chips: Not tested   Thin Liquid Thin Liquid: Impaired Presentation: Straw Oral Phase Impairments:  (none) Oral Phase Functional Implications: Oral holding Pharyngeal  Phase Impairments: Suspected delayed Swallow (intermittent)    Nectar Thick Nectar Thick Liquid: Not tested   Honey Thick Honey Thick Liquid: Not tested   Puree Puree: Within functional limits   Solid   GO   Solid: Within functional limits        [employer] , Student-SLP 02/25/2017,2:32 PM

## 2017-02-25 NOTE — Consult Note (Signed)
Consultation Note Date: 02/25/2017   Patient Name: [name]  DOB: 02-Jun-1931  MRN: [id number]  Age / Sex: 83 y.o., female  PCP: [name], MD Referring Physician: Bonnielee Haff, MD  Reason for Consultation: Establishing goals of care  HPI/Patient Profile: 83 y.o. female  with past medical history of dementia, Parkinson's, stroke, urinary retention with chronic foley, recurrent UTIs, and hypothyroidism. She presented from ALF with lethargy and confusion. UA abnormal. She was subsequently admitted on 02/24/2017 with toxic metabolic encephalopathy secondary to UTI. Her foley was exchanged in the ED. Palliative consulted to assist in goals of care discussion as pt's daughter reports she has been declining over the past few months.  Clinical Assessment and Goals of Care: I met with [name] two daughters outside of the room, per their request. We had a long discussion about her overall health. They relate a progressive decline starting a few years ago when her husband died. From that point they have noted decreased functional capacity, with progression to a wheelchair, more memory deficits, and increased frequency of infections. They also recognize that each infection, most notably the UTI from her prior admission, has resulted in more debility (both mental and physical). They are both frustrated by this trend in slowly progressive deterioration.  In talking through their story I explained the progressive nature of her chronic issues, as well as the expected trend of worsening baseline status with significant infection/injury. For [name], it becomes a question of what they perceive her goals of care are. On one hand she may want measures to prolong her life, which would mean continuing with likely cyclical hospitalizations. In this route the goal would be to continue to pursue interventions to enable her more time. On the other hand, she may want  to focus on being comfortable in her home environment. This route would mean breaking the pattern of repeat hospitalizations and focusing on comfort and dignity, although understanding she may have less time. As we talked through these different options I emphasized that, as her voice, they would need to consider which path she would want.  At present, both [name] and [name] are focused on gathering more information about the underlying cause of her infections, and investigating options for preventing them or mitigating risk. They would like to have a Urologist evaluate her case, with consideration of a suprapubic catheter and possibly long term prophylactic antibiotics. While they recognize that her overlying chronic issues--dementia and parkinson's--cannot be cured, they are hopeful that limiting infections may give her more time out of the hospital and, ideally, not exacerbate her already debilitated state. They do not feel she is ready for comfort measures only, as they feel there are still issues that can be treated to help stabilize her.  Finally, we did discuss code status. They both agree that she would want all resuscitative measures in hopes of possible recovery. I did express my concern of putting her through those measures with the low likelihood of meaningful recovery, but they were clear that she would want that chance.    Primary Decision Maker NEXT OF KIN   SUMMARY OF RECOMMENDATIONS    Full code  Would recommend Urology consult, per family request  Will continue to support daughters as they consider trajectory of care; at this point they are not ready for comfort measures only and would like more information about infection prevention  Code Status/Advance Care Planning:  Full code  Symptom Management:   [name] reports her bladder  pain and bone pain is much better. She declined medication when offered.   Palliative Prophylaxis:   Delirium Protocol, Frequent Pain Assessment  and Turn Reposition  Additional Recommendations (Limitations, Scope, Preferences):  Full Scope Treatment  Psycho-social/Spiritual:   Desire for further Chaplaincy support:no  Additional Recommendations: TBD  Prognosis:   Unable to determine  Discharge Planning: back to ALF memory care unit.      Primary Diagnoses: Present on Admission: . Acute encephalopathy . (Resolved) Altered mental status . Hypothyroid . TIA (transient ischemic attack) . Urinary retention   I have reviewed the medical record, interviewed the patient and family, and examined the patient. The following aspects are pertinent.  Past Medical History:  Diagnosis Date  . Alcohol abuse   . Alzheimers disease   . Arthritis   . Chronic indwelling Foley catheter   . Depression   . Depression   . Insomnia   . MRSA (methicillin resistant staph aureus) culture positive   . Parkinson disease ([place])   . Stroke ([place])   . Thyroid disease    hypothyroid  . TIA (transient ischemic attack)   . UTI (urinary tract infection)    Social History   Social History  . Marital status: Widowed    Spouse name: N/A  . Number of children: N/A  . Years of education: N/A   Social History Main Topics  . Smoking status: Never Smoker  . Smokeless tobacco: Never Used  . Alcohol use No     Comment: used to drink   . Drug use: No  . Sexual activity: Not Asked   Other Topics Concern  . None   Social History Narrative  . None   Family History  Problem Relation Age of Onset  . Arthritis Mother   . Hyperlipidemia Mother   . Stroke Mother   . Hypertension Mother   . Mental illness Mother   . Cancer Father     lung  . Cancer Sister     ovarian/uterine  . Stroke Sister   . Hypertension Sister   . Hypertension Brother   . Diabetes Brother   . Cancer Daughter     breast   Scheduled Meds: . acidophilus  1 capsule Oral Daily  . atorvastatin  20 mg Oral q1800  . cefTRIAXone (ROCEPHIN)  IV  1 g Intravenous  Q1400  . clopidogrel  75 mg Oral Daily  . enoxaparin (LOVENOX) injection  40 mg Subcutaneous Q24H  . feeding supplement (ENSURE ENLIVE)  237 mL Oral BID BM  . levothyroxine  150 mcg Oral QAC breakfast  . multivitamin with minerals  1 tablet Oral Daily  . sertraline  50 mg Oral Daily  . vancomycin  1,000 mg Intravenous Q24H  . zinc oxide   Topical TID   Continuous Infusions: . sodium chloride 75 mL/hr at 02/25/17 1154   PRN Meds:.acetaminophen **OR** acetaminophen, ondansetron **OR** ondansetron (ZOFRAN) IV Allergies  Allergen Reactions  . Ciprofloxacin Rash  . Citalopram Hydrobromide Other (See Comments)    Shaky inside  . Sulfa Antibiotics Rash   Review of Systems  -Limited due to confusion -Reports pain is better in her bladder and bones  Physical Exam  Constitutional: She appears well-developed. She has a sickly appearance.  Losing weight, visible in face  Eyes: EOM are normal.  Neck: Normal range of motion. Neck supple.  Cardiovascular: Normal rate and regular rhythm.   Pulmonary/Chest: Effort normal and breath sounds normal. No respiratory distress.  Abdominal: Soft. Bowel sounds are  normal. She exhibits no distension. There is tenderness (suprapubic).  Musculoskeletal: She exhibits no edema.  Difficult to assess as pt unable to consistently follow commands  Neurological: She is alert.  Oriented to self only.   Skin: Skin is warm and dry. There is pallor.  Psychiatric: She has a normal mood and affect. Her speech is delayed, tangential and slurred. She is slowed and withdrawn. Cognition and memory are impaired.    Vital Signs: BP 140/63 (BP Location: Right Arm)   Pulse 79   Temp 97.7 F (36.5 C) (Oral)   Resp 20   Ht '5\' 2"'$  (1.575 m)   Wt 60.3 kg (133 lb)   SpO2 98%   BMI 24.33 kg/m  Pain Assessment: No/denies pain   Pain Score: Asleep   SpO2: SpO2: 98 % O2 Device:SpO2: 98 % O2 Flow Rate: .   IO: Intake/output summary:  Intake/Output Summary (Last 24  hours) at 02/25/17 1356 Last data filed at 02/25/17 0741  Gross per 24 hour  Intake             1330 ml  Output              350 ml  Net              980 ml    LBM: Last BM Date: 02/23/17 Baseline Weight: Weight: 60.3 kg (133 lb) Most recent weight: Weight: 60.3 kg (133 lb)     Palliative Assessment/Data: PPS 30-40%   Flowsheet Rows     Most Recent Value  Intake Tab  Referral Department  Hospitalist  Unit at Time of Referral  Med/Surg Unit  Palliative Care Primary Diagnosis  Neurology  Date Notified  02/25/17  Palliative Care Type  Return patient Palliative Care  Reason for referral  Clarify Goals of Care  Date of Admission  02/24/17  # of days IP prior to Palliative referral  1  Clinical Assessment  Psychosocial & Spiritual Assessment  Palliative Care Outcomes     Time Total: 70 minutes Greater than 50%  of this time was spent counseling and coordinating care related to the above assessment and plan.  Signed by: Charlynn Court, NP Palliative Medicine Team Pager # [phone] (M-F 7a-5p) Team Phone # [phone] (Nights/Weekends)

## 2017-02-25 NOTE — Progress Notes (Addendum)
[facility] PROGRESS NOTE  JAKIERA EHLER HQP:[id number] DOB: 02/18/1931 DOA: 02/24/2017  PCP: [name], MD  Brief History/Interval Summary: 83 year old Caucasian female with a past medical history of dementia, likely vascular. History of stroke, history of the urinary retention with a chronic Foley for the past 2 years, frequent UTIs, hypothyroidism, presented from her assisted living facility with lethargy and worsening confusion. UA was noted to be abnormal. Patient was hospitalized for further management.  Reason for Visit: Acute toxic encephalopathy  Consultants:  Palliative medicine  Procedures: None  Antibiotics: Vancomycin and Rocephin  Subjective/Interval History: Patient very confused. She is calling out for her daughter, [name]. Was able to tell me her name.  ROS: Unable to do  Objective:  Vital Signs  Vitals:   02/24/17 1438 02/24/17 1600 02/24/17 2024 02/25/17 0532  BP: 123/77 (!) 156/115 (!) 122/56 140/63  Pulse: 89 99 82 79  Resp: 15 18 16 20   Temp:  99.1 F (37.3 C) 98.8 F (37.1 C) 97.7 F (36.5 C)  TempSrc:  Axillary Axillary Oral  SpO2: 97% 96% 95% 98%  Weight:      Height:        Intake/Output Summary (Last 24 hours) at 02/25/17 1136 Last data filed at 02/25/17 0741  Gross per 24 hour  Intake             1830 ml  Output              350 ml  Net             1480 ml   Filed Weights   02/24/17 0956  Weight: 60.3 kg (133 lb)    General appearance: alert, appears stated age, distracted and no distress Resp: clear to auscultation bilaterally Cardio: regular rate and rhythm, S1, S2 normal, no murmur, click, rub or gallop GI: soft, non-tender; bowel sounds normal; no masses,  no organomegaly Extremities: No obvious deformity noted Neurologic: Patient is disoriented, confused. Moving her arms. Somewhat restricted movement in the legs.  Lab Results:  Data Reviewed: I have personally reviewed following labs and imaging  studies  CBC:  Recent Labs Lab 02/24/17 1020 02/25/17 0624  WBC 11.6* 7.9  NEUTROABS 8.7*  --   HGB 12.4 11.2*  HCT 38.1 35.0*  MCV 87.4 88.6  PLT 306 599    Basic Metabolic Panel:  Recent Labs Lab 02/24/17 1024 02/25/17 0624  NA 141 140  K 3.9 3.7  CL 108 110  CO2 24 24  GLUCOSE 122* 98  BUN 17 11  CREATININE 0.61 0.47  CALCIUM 9.0 8.4*    GFR: Estimated Creatinine Clearance: 44.8 mL/min (by C-G formula based on SCr of 0.47 mg/dL).  Liver Function Tests:  Recent Labs Lab 02/24/17 1024  AST 19  ALT 13*  ALKPHOS 92  BILITOT 0.9  PROT 6.9  ALBUMIN 3.5    CBG:  Recent Labs Lab 02/24/17 1007  GLUCAP 97     Radiology Studies: Ct Head Wo Contrast  Result Date: 02/24/2017 CLINICAL DATA:  Altered mental status, unresponsive EXAM: CT HEAD WITHOUT CONTRAST TECHNIQUE: Contiguous axial images were obtained from the base of the skull through the vertex without intravenous contrast. COMPARISON:  MRI brain dated 02/03/2017 FINDINGS: Brain: No evidence of acute infarction, hemorrhage, hydrocephalus, extra-axial collection or mass lesion/mass effect. Subcortical white matter and periventricular small vessel ischemic changes. Mild global cortical atrophy. Vascular: Intracranial atherosclerosis. Skull: Normal. Negative for fracture or focal lesion. Sinuses/Orbits: The visualized paranasal sinuses are essentially  clear. The mastoid air cells are unopacified. Other: None. IMPRESSION: No evidence of acute intracranial abnormality. Atrophy with small vessel ischemic changes. Electronically Signed   By: Julian Hy M.D.   On: 02/24/2017 10:54   Dg Chest Port 1 View  Result Date: 02/24/2017 CLINICAL DATA:  Patient is unresponsive. EXAM: PORTABLE CHEST 1 VIEW COMPARISON:  February 03, 2017 FINDINGS: The heart, hila, mediastinum, lungs, and pleura are unremarkable. IMPRESSION: No active disease. Electronically Signed   By: Dorise Bullion III M.D   On: 02/24/2017 14:11      Medications:  Scheduled: . acidophilus  1 capsule Oral Daily  . atorvastatin  20 mg Oral q1800  . cefTRIAXone (ROCEPHIN)  IV  1 g Intravenous Q1400  . clopidogrel  75 mg Oral Daily  . enoxaparin (LOVENOX) injection  40 mg Subcutaneous Q24H  . feeding supplement (ENSURE ENLIVE)  237 mL Oral BID BM  . levothyroxine  150 mcg Oral QAC breakfast  . multivitamin with minerals  1 tablet Oral Daily  . sertraline  50 mg Oral Daily  . vancomycin  1,000 mg Intravenous Q24H  . zinc oxide   Topical TID   Continuous: . sodium chloride 100 mL/hr at 02/25/17 0600   GNF:AOZHYQMVHQION **OR** acetaminophen, ondansetron **OR** ondansetron (ZOFRAN) IV  Assessment/Plan:  Principal Problem:   Acute encephalopathy Active Problems:   Hypothyroid   TIA (transient ischemic attack)   Urinary retention    Acute encephalopathy, toxic Secondary to UTI. Mental status appears to be improving some. Although not get back to baseline.  Urinary tract infection UA was noted to be abnormal. Patient was hospitalized back in March for similar issue. Cultures at that time did not show any growth. Cultures have been repeated. Continue vancomycin and Rocephin for now.   History of urinary retention with chronic indwelling Foley Foley catheter was replaced in the emergency department yesterday. Apparently there was some problem with urine drainage from the catheter at the assisted living facility. Renal function stable. Reasonable to do renal ultrasound to make sure there is no structural abnormality. Daughter was wondering about suprapubic catheter, however, unlikely that urology would be able to do it while she is in the hospital. There is seems to be no other problems with urine drainage with a Foley catheter. Will discuss with daughter tomorrow. She is okay with this plan.  Advanced dementia, likely vascular MRI done during her last hospitalization does show significant chronic ischemic disease. Currently  encephalopathic due to UTI. According to daughter, patient has been declining over the past many months. Agreeable to palliative medicine input. Speech therapy to see.  History of stroke Continue Plavix  History of hypothyroidism Continue levothyroxin  DVT Prophylaxis: Lovenox    Code Status: Full code  Family Communication: Discussed with the daughter, [name]. She is agreeable to a palliative medicine consult for goals of care  Disposition Plan: Management as outlined above. PT, OT evaluation.    LOS: 1 day   [facility] Pager [phone] 02/25/2017, 11:36 AM  If 7PM-7AM, please contact night-coverage at [email or web address], password [facility]

## 2017-02-26 LAB — BASIC METABOLIC PANEL
ANION GAP: 8 (ref 5–15)
BUN: 6 mg/dL (ref 6–20)
CALCIUM: 8.1 mg/dL — AB (ref 8.9–10.3)
CO2: 22 mmol/L (ref 22–32)
Chloride: 112 mmol/L — ABNORMAL HIGH (ref 101–111)
Creatinine, Ser: 0.43 mg/dL — ABNORMAL LOW (ref 0.44–1.00)
GFR calc Af Amer: >60 mL/min (ref >60)
Glucose, Bld: 87 mg/dL (ref 65–99)
POTASSIUM: 3.2 mmol/L — AB (ref 3.5–5.1)
Sodium: 142 mmol/L (ref 135–145)

## 2017-02-26 LAB — CBC
HEMATOCRIT: 31.7 % — AB (ref 36.0–46.0)
HEMOGLOBIN: 10.1 g/dL — AB (ref 12.0–15.0)
MCH: 27.4 pg (ref 26.0–34.0)
MCHC: 31.9 g/dL (ref 30.0–36.0)
MCV: 86.1 fL (ref 78.0–100.0)
Platelets: 264 10*3/uL (ref 150–400)
RBC: 3.68 MIL/uL — ABNORMAL LOW (ref 3.87–5.11)
RDW: 14.6 % (ref 11.5–15.5)
WBC: 6.9 10*3/uL (ref 4.0–10.5)

## 2017-02-26 LAB — URINE CULTURE

## 2017-02-26 MED ORDER — POTASSIUM CHLORIDE CRYS ER 20 MEQ PO TBCR
40.0000 meq | EXTENDED_RELEASE_TABLET | Freq: Once | ORAL | Status: DC
  Filled 2017-02-26: qty 2

## 2017-02-26 MED ORDER — CEFTRIAXONE SODIUM 1 G IJ SOLR
1.0000 g | INTRAMUSCULAR | Status: DC
  Administered 2017-02-26: 1 g via INTRAVENOUS
  Filled 2017-02-26: qty 10

## 2017-02-26 MED ORDER — POTASSIUM CHLORIDE 10 MEQ/100ML IV SOLN
10.0000 meq | INTRAVENOUS | Status: AC
  Administered 2017-02-26 (×2): 10 meq via INTRAVENOUS
  Filled 2017-02-26 (×2): qty 100

## 2017-02-26 MED ORDER — POTASSIUM CHLORIDE 10 MEQ/100ML IV SOLN
10.0000 meq | INTRAVENOUS | Status: AC
  Administered 2017-02-26: 10 meq via INTRAVENOUS
  Filled 2017-02-26 (×3): qty 100

## 2017-02-26 MED ORDER — SODIUM CHLORIDE 0.9 % IV SOLN: 30.0000 meq | Freq: Once | INTRAVENOUS | Status: DC

## 2017-02-26 NOTE — Evaluation (Signed)
Physical Therapy Evaluation Patient Details Name: [name] MRN: [id number] DOB: 11/21/1930 Today's Date: 02/26/2017   History of Present Illness  83 y.o. female  with past medical history of dementia, Parkinson's, stroke, urinary retention with chronic foley, recurrent UTIs, and hypothyroidism. She presented from ALF with lethargy and confusion.  Clinical Impression  Pt admitted as above and presenting with functional mobility limitations 2* generalized weakness, balance deficits, decreased ROM all 4s and rigidity.  Pt would benefit from return to previous memory care unit if level of assist needed can be provided.    Follow Up Recommendations SNF (Memory care at [facility] if can provide level of assist)    Equipment Recommendations  None recommended by PT    Recommendations for Other Services       Precautions / Restrictions Precautions Precautions: Fall Restrictions Weight Bearing Restrictions: No      Mobility  Bed Mobility Overal bed mobility: Needs Assistance Bed Mobility: Supine to Sit;Sit to Supine Rolling: Total assist   Supine to sit: Max assist;+2 for physical assistance Sit to supine: Max assist;+2 for physical assistance   General bed mobility comments: Increased assist 2* rigidity and ltd ROM all 4s  Transfers Overall transfer level: Needs assistance Equipment used: Rolling walker (2 wheeled) Transfers: Sit to/from Stand Sit to Stand: Mod assist;Max assist;+2 physical assistance;+2 safety/equipment         General transfer comment: Pt stood x 2 with semi-erect posture - ~ 1 min total.  Ambulation/Gait             General Gait Details: Pt stood with semi-erect posture x 2 but unable to initiate step  Stairs            Wheelchair Mobility    Modified Rankin (Stroke Patients Only)       Balance Overall balance assessment: Needs assistance Sitting-balance support: No upper extremity supported;Feet supported Sitting  balance-Leahy Scale: Fair Sitting balance - Comments: Pt assisted to sitting and to find balance and then able to balance unassisted x 5 min     Standing balance-Leahy Scale: Zero Standing balance comment: Unable to stand fully upright. Increased fear of falling                             Pertinent Vitals/Pain Pain Assessment: No/denies pain    Home Living Family/patient expects to be discharged to:: Skilled nursing facility   Available Help at Discharge: [place] Type of Home: [place]           Additional Comments: Pt resides in [facility] at [facility].     Prior Function Level of Independence: Needs assistance   Gait / Transfers Assistance Needed: According to daughter, pt is w/c bound, requiring assist of 1-2 for squat pivot to chair.  ADL's / Homemaking Assistance Needed: Assist for all ADLs.         Hand Dominance        Extremity/Trunk Assessment   Upper Extremity Assessment Upper Extremity Assessment: Defer to OT evaluation    Lower Extremity Assessment Lower Extremity Assessment: Generalized weakness;Difficult to assess due to impaired cognition;RLE deficits/detail;LLE deficits/detail RLE Deficits / Details: Voluntary movement noted bil LEs but with limited ROM all jts all planes - most restricted appears to be bil ABD at hips.    Cervical / Trunk Assessment Cervical / Trunk Assessment: Kyphotic Cervical / Trunk Exceptions: Increased rigidity throughout trunk and BUE/BLE.  Communication   Communication: Expressive difficulties  Cognition Arousal/Alertness: Awake/alert Behavior During Therapy: WFL for tasks assessed/performed;Flat affect Overall Cognitive Status: History of cognitive impairments - at baseline Area of Impairment: Attention;Following commands                     Memory: Decreased short-term memory Following Commands: Follows one step commands inconsistently;Follows one  step commands with increased time   Awareness: Intellectual Problem Solving: Slow processing;Decreased initiation;Difficulty sequencing;Requires verbal cues;Requires tactile cues General Comments: Slurred speech with intermittent moments of it being comprehensible, which made cognitive assessment difficulty.       General Comments      Exercises     Assessment/Plan    PT Assessment Patient needs continued PT services  PT Problem List Decreased strength;Decreased range of motion;Decreased mobility;Decreased cognition;Decreased activity tolerance;Decreased balance;Decreased knowledge of use of DME;Decreased skin integrity;Impaired tone       PT Treatment Interventions Therapeutic activities;Therapeutic exercise;Patient/family education;Balance training;Functional mobility training;Neuromuscular re-education    PT Goals (Current goals can be found in the Care Plan section)  Acute Rehab PT Goals Patient Stated Goal: No goals stated PT Goal Formulation: Patient unable to participate in goal setting Time For Goal Achievement: 02/17/17 Potential to Achieve Goals: Fair    Frequency Min 2X/week   Barriers to discharge        Co-evaluation               End of Session Equipment Utilized During Treatment: Gait belt Activity Tolerance: Patient limited by fatigue Patient left: in bed;with call bell/phone within reach Nurse Communication: Mobility status PT Visit Diagnosis: Muscle weakness (generalized) (M62.81);Difficulty in walking, not elsewhere classified (R26.2)    Time: 2952-8413 PT Time Calculation (min) (ACUTE ONLY): 23 min   Charges:   PT Evaluation $PT Eval Moderate Complexity: 1 Procedure PT Treatments $Therapeutic Activity: 8-22 mins   PT G Codes:        Pg [phone]  Wm Fruchter 02/26/2017, 1:10 PM

## 2017-02-26 NOTE — Consult Note (Signed)
Addendum to consult note written by Debbrah Alar, PA-C  Patient is in a difficult situation with significant immobility and urinary incontinence secondary to advanced dementia and Parkinson's disease. Her situation is been managed with a Foley catheter which recently has become a problem with recurrent infections.  At this point, the patient should be maintained on a three-week catheter change regimen given that waiting every 4 weeks seems to lead to severe infection and hospitalization. In addition, I recommended to the daughter that the patient be on 2 cranberry tablets twice daily as well as a Lactobacillus probiotic to ensure that we are doing as much as possible to prevent infections. She also should be encouraged to drink more fluids as she seems to be chronically dehydrated. Further, we discussed the importance of maintaining regular soft bowel movements on a daily basis as constipation is one of the biggest risk factors for recurrent infections. I don't think the patient's kidney stone has anything to do with her recurrent infections or the signs and symptoms that she is currently dealing with.  I discussed the option of suprapubic catheter with the patient's daughter. This will not reduce her risks of infection and likely complicate things by making a leakage worse. I would maintain an indwelling urethral Foley and be very reluctant to exchange this to a suprapubic tube.  We will get this patient scheduled for close follow-up with urology in the next several weeks.

## 2017-02-26 NOTE — Evaluation (Signed)
Occupational Therapy Evaluation- Late entry Patient Details Name: [name] MRN: [id number] DOB: 01/07/1931  Today's Date: 02/26/2017    History of Present Illness 83 y.o. female  with past medical history of dementia, Parkinson's, stroke, urinary retention with chronic foley, recurrent UTIs, and hypothyroidism. She presented from ALF with lethargy and confusion.   Clinical Impression    Pt admitted with lethargy and confusion. Pt currently with functional limitations due to the deficits listed below (see OT Problem List).  Pt will benefit from skilled OT to increase their safety and independence with ADL and functional mobility for ADL to facilitate discharge to venue listed below.  Pt is so rigid  It makes self care tasks very difficult ( hygiene )   Follow Up Recommendations  SNF    Equipment Recommendations  None recommended by OT    Recommendations for Other Services       Precautions / Restrictions Precautions Precautions: Fall      Mobility Bed Mobility Overal bed mobility: Needs Assistance Bed Mobility: Supine to Sit;Sit to Supine Rolling: Total assist Sidelying to sit: Total assist Supine to sit: Total assist Sit to supine: Total assist   General bed mobility comments: pt total A bed mobility due to ridigity and confusion  Transfers                 General transfer comment: did not perform    Balance Overall balance assessment: Needs assistance   Sitting balance-Leahy Scale: Poor   Postural control: Posterior lean;Right lateral lean   Standing balance-Leahy Scale: Zero                             ADL either performed or assessed with clinical judgement   ADL Overall ADL's : Needs assistance/impaired                                       General ADL Comments: pt total A with ADL activity.  Pt very rigid which makes it very difficult to perform ADL activity . Pt total A to sit EOB.       Vision Patient Visual  Report: No change from baseline              Pertinent Vitals/Pain Faces Pain Scale: Hurts little more Pain Location: pt did not state- but appeared uncomfortable sitting EOB     Hand Dominance     Extremity/Trunk Assessment Upper Extremity Assessment Upper Extremity Assessment: RUE deficits/detail;LUE deficits/detail RUE Deficits / Details: Pts BUE very rigid and hard to perform ROM.  Pt would not be able scratch face or feed self ue to stiffness in BUE.  Pts daughters state she use to be able to feed self several weeks ago.  Unable to perform full ROM due to stiffness LUE Deficits / Details: Pts BUE very rigid and hard to perform ROM.  Pt would not be able scratch face or feed self ue to stiffness in BUE.  Pts daughters state she use to be able to feed self several weeks ago.  Unable to perform full ROM due to stiffness       Cervical / Trunk Assessment Cervical / Trunk Exceptions: Increased rigidity throughout trunk and BUE/BLE.    Communication     Cognition   Behavior During Therapy: Agitated Overall Cognitive Status: History of cognitive impairments - at baseline Area  of Impairment: Attention;Following commands;Orientation                                              Home Living Family/patient expects to be discharged to:: Skilled nursing facility                                                 OT Problem List: Decreased strength;Decreased range of motion;Decreased activity tolerance;Decreased coordination;Decreased knowledge of use of DME or AE;Impaired UE functional use;Decreased safety awareness;Impaired balance (sitting and/or standing);Decreased cognition;Impaired tone      OT Treatment/Interventions: Self-care/ADL training;Patient/family education;Therapeutic exercise;Therapeutic activities    OT Goals(Current goals can be found in the care plan section)    OT Frequency: Min 2X/week   Barriers to D/C:                End of Session Nurse Communication: Mobility status  Activity Tolerance: Treatment limited secondary to agitation Patient left: in bed;with call bell/phone within reach;with nursing/sitter in room  OT Visit Diagnosis: Muscle weakness (generalized) (M62.81);Cognitive communication deficit (R41.841);Other symptoms and signs involving the nervous system (R29.898);Other abnormalities of gait and mobility (R26.89)                  Varina Hulon, Thereasa Parkin 02/26/2017, 8:52 AM

## 2017-02-26 NOTE — Progress Notes (Signed)
Initial Nutrition Assessment  INTERVENTION:   Continue Ensure Enlive po BID, each supplement provides 350 kcal and 20 grams of protein RD to continue to monitor [place] and nutritional needs  NUTRITION DIAGNOSIS:   Increased nutrient needs related to wound healing as evidenced by estimated needs.  GOAL:   Patient will meet greater than or equal to 90% of their needs  MONITOR:   PO intake, Supplement acceptance, Labs, Weight trends, I & O's, Skin  REASON FOR ASSESSMENT:   Malnutrition Screening Tool    ASSESSMENT:   83 year old Caucasian female with a past medical history of dementia, likely vascular. History of stroke, history of the urinary retention with a chronic Foley for the past 2 years, frequent UTIs, hypothyroidism, presented from her assisted living facility with lethargy and worsening confusion. UA was noted to be abnormal. Patient was hospitalized for further management.  Patient with advanced dementia. Per chart review, pt refused to eat meals. Today PO intakes have ranged from 20-25%. SLP evaluation: mild aspiration risk, recommend dysphagia 2 diet. Pt accepting Ensure inconsistently. Will continue order for increased needs given multiple pressure injuries.  Palliative care following.  Per chart review, pt's weight has increased. NFPE not performed given confusion and AMS.  Medications: Multivitamin with minerals daily Labs reviewed: Low K  Diet Order:  DIET DYS 2 Room service appropriate? Yes; Fluid consistency: Thin  Skin:   Stage I sacrum pressure injury, Stage II buttocks pressure injury, Stage II bilateral thigh pressure injuries  Last BM:  4/7  Height:   Ht Readings from Last 1 Encounters:  02/24/17 5\' 2"  (1.575 m)    Weight:   Wt Readings from Last 1 Encounters:  02/24/17 133 lb (60.3 kg)    Ideal Body Weight:  50 kg  BMI:  Body mass index is 24.33 kg/m.  Estimated Nutritional Needs:   Kcal:  1500-1700  Protein:  65-75g  Fluid:   1.5L/day  EDUCATION NEEDS:   No education needs identified at this time  Clayton Bibles, MS, RD, LDN Pager: [phone] After Hours Pager: [phone]

## 2017-02-26 NOTE — Progress Notes (Signed)
CSW following to assist with patient discharge needs. A full CSW assessment was completed on 02/05/17. Please refer to it for additional information. Patient is from [facility] care unit. CSW spoke with patient daughters about patient disposition. Family agreeable for patient to return to [facility] once medically stable, if she meets functional ability to return to the ALF. CSW spoke with patient medical technician on memory care unit, she report the patient gets assistance with all ADL's. CSW will continue to follow.   Kathrin Greathouse, Latanya Presser, MSW Clinical Social Worker [place] [facility] [phone] 02/26/2017  10:47 AM

## 2017-02-26 NOTE — Progress Notes (Addendum)
[facility] PROGRESS NOTE  JOSILYN SHIPPEE DXA:[id number] DOB: August 20, 1931 DOA: 02/24/2017  PCP: [name], MD  Brief History/Interval Summary: 83 year old Caucasian female with a past medical history of dementia, likely vascular. History of stroke, history of the urinary retention with a chronic Foley for the past 2 years, frequent UTIs, hypothyroidism, presented from her assisted living facility with lethargy and worsening confusion. UA was noted to be abnormal. Patient was hospitalized for further management.  Reason for Visit: Acute toxic encephalopathy  Consultants:  Palliative medicine. Urology  Procedures: None  Antibiotics: Vancomycin and Rocephin Vancomycin to be stopped today  Subjective/Interval History: Patient remains confused. She is likely close to her baseline.   ROS: Unable to do  Objective:  Vital Signs  Vitals:   02/25/17 0532 02/25/17 1501 02/25/17 2044 02/26/17 0550  BP: 140/63 (!) 135/98 (!) 151/52 (!) 155/60  Pulse: 79 81 84 93  Resp: 20 18 18 18   Temp: 97.7 F (36.5 C) 98.9 F (37.2 C) 98.6 F (37 C) 98.6 F (37 C)  TempSrc: Oral Oral Oral Axillary  SpO2: 98% 97% 95% 95%  Weight:      Height:        Intake/Output Summary (Last 24 hours) at 02/26/17 0933 Last data filed at 02/26/17 0600  Gross per 24 hour  Intake           2227.5 ml  Output             1200 ml  Net           1027.5 ml   Filed Weights   02/24/17 0956  Weight: 60.3 kg (133 lb)    General appearance: alert, distracted and no distress Resp: clear to auscultation bilaterally Cardio: regular rate and rhythm, S1, S2 normal, no murmur, click, rub or gallop GI: soft, non-tender; bowel sounds normal; no masses,  no organomegaly Extremities: No obvious deformity noted Neurologic: Patient is disoriented, confused. Moving her arms. Somewhat restricted movement in the legs.  Lab Results:  Data Reviewed: I have personally reviewed following labs and imaging  studies  CBC:  Recent Labs Lab 02/24/17 1020 02/25/17 0624 02/26/17 0616  WBC 11.6* 7.9 6.9  NEUTROABS 8.7*  --   --   HGB 12.4 11.2* 10.1*  HCT 38.1 35.0* 31.7*  MCV 87.4 88.6 86.1  PLT 306 272 209    Basic Metabolic Panel:  Recent Labs Lab 02/24/17 1024 02/25/17 0624 02/26/17 0616  NA 141 140 142  K 3.9 3.7 3.2*  CL 108 110 112*  CO2 24 24 22   GLUCOSE 122* 98 87  BUN 17 11 6   CREATININE 0.61 0.47 0.43*  CALCIUM 9.0 8.4* 8.1*    GFR: Estimated Creatinine Clearance: 44.8 mL/min (A) (by C-G formula based on SCr of 0.43 mg/dL (L)).  Liver Function Tests:  Recent Labs Lab 02/24/17 1024  AST 19  ALT 13*  ALKPHOS 92  BILITOT 0.9  PROT 6.9  ALBUMIN 3.5    CBG:  Recent Labs Lab 02/24/17 1007  GLUCAP 97     Radiology Studies: Ct Head Wo Contrast  Result Date: 02/24/2017 CLINICAL DATA:  Altered mental status, unresponsive EXAM: CT HEAD WITHOUT CONTRAST TECHNIQUE: Contiguous axial images were obtained from the base of the skull through the vertex without intravenous contrast. COMPARISON:  MRI brain dated 02/03/2017 FINDINGS: Brain: No evidence of acute infarction, hemorrhage, hydrocephalus, extra-axial collection or mass lesion/mass effect. Subcortical white matter and periventricular small vessel ischemic changes. Mild global cortical atrophy. Vascular:  Intracranial atherosclerosis. Skull: Normal. Negative for fracture or focal lesion. Sinuses/Orbits: The visualized paranasal sinuses are essentially clear. The mastoid air cells are unopacified. Other: None. IMPRESSION: No evidence of acute intracranial abnormality. Atrophy with small vessel ischemic changes. Electronically Signed   By: Julian Hy M.D.   On: 02/24/2017 10:54   US Renal  Result Date: 02/25/2017 CLINICAL DATA:  Frequent UTI EXAM: RENAL / URINARY TRACT ULTRASOUND COMPLETE COMPARISON:  CT scan 12/ 31/14 FINDINGS: Right Kidney: Length: 10.5 cm. Normal echogenicity. There is a elongated probable  shadowing calcification in upper pole of the right kidney measures at least 1.6 cm. Nonobstructive calculus cannot be excluded. Left Kidney: Length: 10 cm. Echogenicity within normal limits. No mass or hydronephrosis visualized. Bladder: Foley catheter within decompressed urinary bladder. IMPRESSION: 1. Probable nonobstructive nephrolithiasis with a elongated calcification in upper pole of the right kidney measures at least 1.6 cm. No hydronephrosis. 2. Foley catheter within decompressed urinary bladder. Electronically Signed   By: Lahoma Crocker M.D.   On: 02/25/2017 14:37   Dg Pelvis Portable  Result Date: 02/25/2017 CLINICAL DATA:  Hip pain. EXAM: PORTABLE PELVIS 1-2 VIEWS COMPARISON:  09/24/2014 FINDINGS: A left total hip arthroplasty is noted. A screw penetrates the acetabulum, unchanged. Moderate osteopenia is noted. Left hip appears located on this single view. Remote right superior and inferior pubic rami fractures are noted. Advanced degenerative changes in the right hip have progressed. No acute abnormality is present. IMPRESSION: 1. Progressive advanced degenerative changes in the right hip. 2. Remote healed fracture of the right superior and inferior pubic ramus. 3. Left total hip arthroplasty is stable. Electronically Signed   By: San Morelle M.D.   On: 02/25/2017 12:23   Dg Chest Port 1 View  Result Date: 02/24/2017 CLINICAL DATA:  Patient is unresponsive. EXAM: PORTABLE CHEST 1 VIEW COMPARISON:  February 03, 2017 FINDINGS: The heart, hila, mediastinum, lungs, and pleura are unremarkable. IMPRESSION: No active disease. Electronically Signed   By: Dorise Bullion III M.D   On: 02/24/2017 14:11     Medications:  Scheduled: . acidophilus  1 capsule Oral Daily  . atorvastatin  20 mg Oral q1800  . cefTRIAXone (ROCEPHIN)  IV  1 g Intravenous Q1400  . clopidogrel  75 mg Oral Daily  . enoxaparin (LOVENOX) injection  40 mg Subcutaneous Q24H  . feeding supplement (ENSURE ENLIVE)  237 mL Oral  BID BM  . levothyroxine  150 mcg Oral QAC breakfast  . multivitamin with minerals  1 tablet Oral Daily  . sertraline  50 mg Oral Daily  . vancomycin  1,000 mg Intravenous Q24H  . zinc oxide   Topical TID   Continuous: . sodium chloride 75 mL/hr at 02/25/17 1154   JKD:[id number] **OR** acetaminophen, ondansetron **OR** ondansetron (ZOFRAN) IV  Assessment/Plan:  Principal Problem:   Acute encephalopathy Active Problems:   Hypothyroid   TIA (transient ischemic attack)   Urinary retention   Goals of care, counseling/discussion   Palliative care by specialist    Acute encephalopathy, toxic Secondary to UTI. Discussed with her daughter. Apparently she was doing much better yesterday afternoon. So, her mental status appears to be at baseline. Due to her dementia, she does have periods when her confusion is more than usual. This was explained to the daughter.   Urinary tract infection UA was noted to be abnormal. Patient was hospitalized back in March for similar issue. Cultures at that time did not show any growth. Cultures have been repeated and again without any  specific organism. Stop vancomycin. Continue ceftriaxone. Anticipate change to [facility] tomorrow.   History of urinary retention with chronic indwelling Foley/right nephrolithiasis Foley catheter was replaced in the emergency department yesterday. Apparently there was some problem with urine drainage from the catheter at the assisted living facility. Renal function stable. Renal ultrasound showed a right kidney stone, which is nonobstructing. Daughter was asking if a suprapubic catheter would be a good option for this patient. They are really interested in a urology consult. Discussed with Dr. Louis Meckel who will see the patient today. There seems to be no other problems with urine drainage with current Foley catheter.   Advanced dementia, likely vascular She appears to be having progressive dementia. MRI done during her last  hospitalization does show significant chronic ischemic disease. According to daughter, patient has been declining over the past many months. Her oral intake is of concern. Speech therapy to see. Seen by palliative medicine. Patient's daughters want to continue full care for now. Discussed in detail with the daughter today. She was told that patient may continue to experience worsening dementia and hence worsening symptoms over the next few weeks to months. She is agreeable to allow palliative medicine to continue to follow patient at the skilled nursing facility.  History of stroke Continue Plavix. Continue statin.  History of hypothyroidism Continue levothyroxin  Hypokalemia. We'll be repleted.  DVT Prophylaxis: Lovenox    Code Status: Full code  Family Communication: Discussed with the daughter, [name].   Disposition Plan: Management as outlined above. PT, OT evaluation. Await urology input. Anticipate discharge in the next 24-48 hours.    LOS: 2 days   [facility] Pager [phone] 02/26/2017, 9:33 AM  If 7PM-7AM, please contact night-coverage at [email or web address], password [facility]

## 2017-02-26 NOTE — Consult Note (Addendum)
Urology Consult   Physician requesting consult: Bonnielee Haff  Reason for consult: UTI  History of Present Illness: [name] is a 83 y.o. female with PMH significant for alzheimers, depression, MRSA, parkinson's, CVA 04/2016, recurrent UTIs, and chronic indwelling foley who presented to the ED from ALF for eval of increased confusion and lethargy.  Pt always has increased confusion and speech difficulties with UTIs per her daughters report.  UA on admission nitrite negative, many bacteria, TNTC RBCs and WBCs.  Urine culture shows multiple species present.  WBC 11.6, Cr 0.61.  RUS no hydro, right non obstructive nephrolithiasis. Pt was started on Vanc and Rocephin and admitted for further management.  Vanc has since been d/c'd.   Pt's daughter states that she saw a urologist many years ago and had to be "stretched" because she kept getting infections.  She was evaled at [facility] by Dr. Jasmine December in 2015 for retention and recurrent UTIs at which time the indwelling foley was placed.  She has not returned to our office since 2015.  The foley has been changed monthly at her facility since placement.  Pt appears to tolerate it well.  Per her daughter pt was placed on Abx suppression at some point but she does not know what Abx it was or who started it.  It was then subsequently stopped by a different physician but the timing is unknown.  She was admitted here in March for similar sx and UTI treated with Ancef and Keflex.  Urine cultures from 04/2016, 01/2017, and current hospitalization all reveal multiple species present with no re-collections done.   Her daughter felt that her confusion and speech had improved yesterday after starting ABx, however, she states today she thinks her speech may be worse again.  She is uncomfortable and c/o of her arm hurting because of potassium infusion.  She is unable to answer most questions due to dementia.    Her daughters are interested in an SP tube placement.   Past  Medical History:  Diagnosis Date  . Alcohol abuse   . Alzheimers disease   . Arthritis   . Chronic indwelling Foley catheter   . Depression   . Depression   . Insomnia   . MRSA (methicillin resistant staph aureus) culture positive   . Parkinson disease ([place])   . Stroke ([place])   . Thyroid disease    hypothyroid  . TIA (transient ischemic attack)   . UTI (urinary tract infection)     Past Surgical History:  Procedure Laterality Date  . ABDOMINAL HYSTERECTOMY     partial  . CHOLECYSTECTOMY  2008  . THYROID SURGERY    . TONSILLECTOMY AND ADENOIDECTOMY    . TOTAL HIP ARTHROPLASTY Left 09/24/2014   Procedure: TOTAL HIP ARTHROPLASTY ANTERIOR APPROACH, LEFT;  Surgeon: Mcarthur Rossetti, MD;  Location: [facility];  Service: Orthopedics;  Laterality: Left;     Current Hospital Medications:  Home meds:  Current Meds  Medication Sig  . atorvastatin (LIPITOR) 20 MG tablet Take 1 tablet (20 mg total) by mouth daily at 6 PM.  . clopidogrel (PLAVIX) 75 MG tablet Take 75 mg by mouth daily.  . feeding supplement, ENSURE ENLIVE, (ENSURE ENLIVE) LIQD Take 237 mLs by mouth 2 (two) times daily between meals.  Marland Kitchen levothyroxine (SYNTHROID, LEVOTHROID) 150 MCG tablet Take 150 mcg by mouth daily before breakfast.  . miconazole (MICOTIN) 2 % cream Apply 1 application topically 2 (two) times daily.  . Multiple Vitamin (MULTIVITAMIN) tablet Take 1  tablet by mouth daily.  . Probiotic Product (ACIDOPHILUS/GOAT MILK) CAPS Take 1 capsule by mouth daily.   . sertraline (ZOLOFT) 50 MG tablet Take 50 mg by mouth daily.   . Zinc Oxide (DESITIN) 13 % CREA Apply 1 application topically 3 (three) times daily.    Scheduled Meds: . acidophilus  1 capsule Oral Daily  . atorvastatin  20 mg Oral q1800  . cefTRIAXone (ROCEPHIN)  IV  1 g Intravenous Q1400  . clopidogrel  75 mg Oral Daily  . enoxaparin (LOVENOX) injection  40 mg Subcutaneous Q24H  . feeding supplement (ENSURE ENLIVE)  237 mL Oral BID BM  .  levothyroxine  150 mcg Oral QAC breakfast  . multivitamin with minerals  1 tablet Oral Daily  . potassium chloride  10 mEq Intravenous Q1 Hr x 3  . sertraline  50 mg Oral Daily  . zinc oxide   Topical TID   Continuous Infusions: PRN Meds:.acetaminophen **OR** acetaminophen, ondansetron **OR** ondansetron (ZOFRAN) IV  Allergies:  Allergies  Allergen Reactions  . Ciprofloxacin Rash  . Citalopram Hydrobromide Other (See Comments)    Shaky inside  . Sulfa Antibiotics Rash    Family History  Problem Relation Age of Onset  . Arthritis Mother   . Hyperlipidemia Mother   . Stroke Mother   . Hypertension Mother   . Mental illness Mother   . Cancer Father     lung  . Cancer Sister     ovarian/uterine  . Stroke Sister   . Hypertension Sister   . Hypertension Brother   . Diabetes Brother   . Cancer Daughter     breast    Social History:  reports that she has never smoked. She has never used smokeless tobacco. She reports that she does not drink alcohol or use drugs.  ROS: A complete review of systems was not performed due to pts dementia.    Physical Exam:  Vital signs in last 24 hours: Temp:  [98.6 F (37 C)-98.9 F (37.2 C)] 98.6 F (37 C) (04/10 0550) Pulse Rate:  [81-93] 93 (04/10 0550) Resp:  [18] 18 (04/10 0550) BP: (135-155)/(52-98) 155/60 (04/10 0550) SpO2:  [95 %-97 %] 95 % (04/10 0550) Constitutional:  Distressed about burning from K+ infusion  Cardiovascular: Regular rate and rhythm Respiratory: Normal respiratory effort GI: Abdomen is soft, nontender, nondistended, no abdominal masses GU: foley in place with clear/yellow urine in bag Lymphatic: No lymphadenopathy Neurologic: Grossly intact Psychiatric: dementia  Laboratory Data:   Recent Labs  02/24/17 1020 02/25/17 0624 02/26/17 0616  WBC 11.6* 7.9 6.9  HGB 12.4 11.2* 10.1*  HCT 38.1 35.0* 31.7*  PLT 306 272 264     Recent Labs  02/24/17 1024 02/25/17 0624 02/26/17 0616  NA 141 140 142   K 3.9 3.7 3.2*  CL 108 110 112*  GLUCOSE 122* 98 87  BUN 17 11 6   CALCIUM 9.0 8.4* 8.1*  CREATININE 0.61 0.47 0.43*     Results for orders placed or performed during the hospital encounter of 02/24/17 (from the past 24 hour(s))  CBC     Status: Abnormal   Collection Time: 02/26/17  6:16 AM  Result Value Ref Range   WBC 6.9 4.0 - 10.5 K/uL   RBC 3.68 (L) 3.87 - 5.11 MIL/uL   Hemoglobin 10.1 (L) 12.0 - 15.0 g/dL   HCT 31.7 (L) 36.0 - 46.0 %   MCV 86.1 78.0 - 100.0 fL   MCH 27.4 26.0 - 34.0 pg  MCHC 31.9 30.0 - 36.0 g/dL   RDW 14.6 11.5 - 15.5 %   Platelets 264 150 - 400 K/uL  Basic metabolic panel     Status: Abnormal   Collection Time: 02/26/17  6:16 AM  Result Value Ref Range   Sodium 142 135 - 145 mmol/L   Potassium 3.2 (L) 3.5 - 5.1 mmol/L   Chloride 112 (H) 101 - 111 mmol/L   CO2 22 22 - 32 mmol/L   Glucose, Bld 87 65 - 99 mg/dL   BUN 6 6 - 20 mg/dL   Creatinine, Ser 0.43 (L) 0.44 - 1.00 mg/dL   Calcium 8.1 (L) 8.9 - 10.3 mg/dL   GFR calc non Af Amer >60 >60 mL/min   GFR calc Af Amer >60 >60 mL/min   Anion gap 8 5 - 15   Recent Results (from the past 240 hour(s))  Culture, blood (routine x 2)     Status: None (Preliminary result)   Collection Time: 02/24/17 10:44 AM  Result Value Ref Range Status   Specimen Description BLOOD LEFT ANTECUBITAL  Final   Special Requests IN PEDIATRIC BOTTLE Blood Culture adequate volume  Final   Culture   Final    NO GROWTH 2 DAYS Performed at [facility], [place] [address], [place], [place] [address]    Report Status PENDING  Incomplete  Culture, blood (routine x 2)     Status: None (Preliminary result)   Collection Time: 02/24/17 11:32 AM  Result Value Ref Range Status   Specimen Description BLOOD LEFT ANTECUBITAL  Final   Special Requests   Final    BOTTLES DRAWN AEROBIC AND ANAEROBIC Blood Culture adequate volume   Culture   Final    NO GROWTH 2 DAYS Performed at [facility], [place] [address], [place],  [place] [address]    Report Status PENDING  Incomplete  Urine culture     Status: Abnormal   Collection Time: 02/24/17 11:32 AM  Result Value Ref Range Status   Specimen Description URINE, RANDOM  Final   Special Requests NONE  Final   Culture MULTIPLE SPECIES PRESENT, SUGGEST RECOLLECTION (A)  Final   Report Status 02/26/2017 FINAL  Final    Renal Function:  Recent Labs  02/24/17 1024 02/25/17 0624 02/26/17 0616  CREATININE 0.61 0.47 0.43*   Estimated Creatinine Clearance: 44.8 mL/min (A) (by C-G formula based on SCr of 0.43 mg/dL (L)).  Radiologic Imaging: US Renal  Result Date: 02/25/2017 CLINICAL DATA:  Frequent UTI EXAM: RENAL / URINARY TRACT ULTRASOUND COMPLETE COMPARISON:  CT scan 12/ 31/14 FINDINGS: Right Kidney: Length: 10.5 cm. Normal echogenicity. There is a elongated probable shadowing calcification in upper pole of the right kidney measures at least 1.6 cm. Nonobstructive calculus cannot be excluded. Left Kidney: Length: 10 cm. Echogenicity within normal limits. No mass or hydronephrosis visualized. Bladder: Foley catheter within decompressed urinary bladder. IMPRESSION: 1. Probable nonobstructive nephrolithiasis with a elongated calcification in upper pole of the right kidney measures at least 1.6 cm. No hydronephrosis. 2. Foley catheter within decompressed urinary bladder. Electronically Signed   By: Lahoma Crocker M.D.   On: 02/25/2017 14:37   Dg Pelvis Portable  Result Date: 02/25/2017 CLINICAL DATA:  Hip pain. EXAM: PORTABLE PELVIS 1-2 VIEWS COMPARISON:  09/24/2014 FINDINGS: A left total hip arthroplasty is noted. A screw penetrates the acetabulum, unchanged. Moderate osteopenia is noted. Left hip appears located on this single view. Remote right superior and inferior pubic rami fractures are noted.  Advanced degenerative changes in the right hip have progressed. No acute abnormality is present. IMPRESSION: 1. Progressive advanced degenerative changes in the right hip. 2. Remote  healed fracture of the right superior and inferior pubic ramus. 3. Left total hip arthroplasty is stable. Electronically Signed   By: San Morelle M.D.   On: 02/25/2017 12:23    Impression/Recommendation:  UTI--Abx per IM.  Pt's sx appear to be waxing and waning but her WBC is improving.  Urine cultures indicate bacterial colonization which is expected with indwelling foley but could also be contaminated.  Re-collection at this time would not be useful due to Abx administration.  Proper, sterile urine collection for culture is essential (do no use urine from bags, clamp foley for urine collection from bladder, etc).  Pt's daughters thought that having an SP tube placed would stop urinary infections but this is not accurate.  Dr. Louis Meckel will still discuss pros/cons of SP tube placement with daughters later today. Foley removal will not benefit pt as she retains urine and incontinence will lead to further skin breakdown (she already has a stage 2 sacral pressure wound).   Do not treat for UTI unless pt is symptomatic as her urine will always appear infected due to colonization. Continue to change foley q 1 month.  Amado Nash, [place] 02/26/2017, 2:19 PM   I performed a history and physical examination of the patient and discussed his management with the Debbrah Alar, [place].  I reviewed the resident's note and agree with the documented findings and plan of care.

## 2017-02-27 LAB — BASIC METABOLIC PANEL
ANION GAP: 7 (ref 5–15)
BUN: 5 mg/dL — ABNORMAL LOW (ref 6–20)
CALCIUM: 8.5 mg/dL — AB (ref 8.9–10.3)
CO2: 23 mmol/L (ref 22–32)
Chloride: 110 mmol/L (ref 101–111)
Creatinine, Ser: 0.39 mg/dL — ABNORMAL LOW (ref 0.44–1.00)
Glucose, Bld: 107 mg/dL — ABNORMAL HIGH (ref 65–99)
Potassium: 3.6 mmol/L (ref 3.5–5.1)
SODIUM: 140 mmol/L (ref 135–145)

## 2017-02-27 MED ORDER — ACETAMINOPHEN 325 MG PO TABS: 650.0000 mg | ORAL_TABLET | Freq: Four times a day (QID) | ORAL | 0 refills | Status: AC | PRN

## 2017-02-27 NOTE — Progress Notes (Addendum)
Patient is from [facility]. CSW called patient daughter to discuss recommended level of care, SNF. Waiting on return phone call.   11:40 CSW attempted call daughters again, left voicemail. Still waiting for return call.   Received return call from pt. Daughter [name], she feels that patient should return back to facility and continue with care. CSW called facility, and provided them with clinical information. Bella Kennedy with admissions called and reports patient cannot return because of the higher level of care needed. CSW informed patient daughters about facility request. Family is frustrated and does not want patient to go to SNF.  Patient daughters are talking with ALF, Waiting for update.   3:50PM Patient daughter reports she is agreeable for SNF placement. She expressed frustrations. Patient will need insurance authorization before discharging.  CSW will continue to assist with discharge planning.   Kathrin Greathouse, Latanya Presser, MSW Clinical Social Worker [facility] [phone]

## 2017-02-27 NOTE — Discharge Instructions (Signed)

## 2017-02-27 NOTE — Discharge Summary (Signed)
Physician Discharge Summary  [name] RKY:[id number] DOB: 1931/01/26 DOA: 02/24/2017  PCP: [name], MD  Admit date: 02/24/2017 Discharge date: 02/27/2017  Recommendations for Outpatient Follow-up:  1. Check CBC and BMP per SNF protocl 2. Change foley catheter every 1 month 3. Per GU,  patient chronically clononized so treat UTI if patient is symptomatic only. 4. Maintain regular soft bowel movements.  Discharge Diagnoses:  Principal Problem:   Acute encephalopathy Active Problems:   Hypothyroid   TIA (transient ischemic attack)   Urinary retention   Goals of care, counseling/discussion   Palliative care by specialist    Discharge Condition: stable   Diet recommendation: as tolerated   History of present illness:   Per brief narrative 02/26/2017 "83 year old Caucasian female with a past medical history of dementia, likely vascular. History of stroke, history of the urinary retention with a chronic Foley for the past 2 years, frequent UTIs, hypothyroidism, presented from her assisted living facility with lethargy and worsening confusion. UA was noted to be abnormal. Patient was hospitalized for further management."  Hospital Course:   Acute encephalopathy, toxic Secondary to UTI. Stable.  Urinary tract infection  Per GU, treat UTI if pt symptomatic only Stop abx today, no need for abx on discharge   History of urinary retention with chronic indwelling Foley/right nephrolithiasis Foley catheter was replaced in the emergency department 4/9 Foley change in 1 month Follow up with GU per scheduled appt   Advanced dementia, likely vascular Stable   History of stroke Continue Plavix. Continue statin.  History of hypothyroidism Continue levothyroxin  Hypokalemia. Supplemented and WNL  DVT Prophylaxis: Lovenox    Code Status: Full code  Family Communication: not at the bedside this am   Signed:  Leisa Lenz, MD  [facility] 02/27/2017,  10:30 AM  Pager #: [phone]  Time spent in minutes: less than 30 minutes   Discharge Exam: Vitals:   02/26/17 2043 02/27/17 0527  BP: (!) 156/76 (!) 150/68  Pulse: 81 74  Resp: 20 20  Temp: 97.8 F (36.6 C) 98.1 F (36.7 C)   Vitals:   02/26/17 0550 02/26/17 1511 02/26/17 2043 02/27/17 0527  BP: (!) 155/60 (!) 160/65 (!) 156/76 (!) 150/68  Pulse: 93 100 81 74  Resp: 18 18 20 20   Temp: 98.6 F (37 C) 97.7 F (36.5 C) 97.8 F (36.6 C) 98.1 F (36.7 C)  TempSrc: Axillary Oral Oral Oral  SpO2: 95% 95% 92% 100%  Weight:      Height:        General: Pt is alert, follows commands appropriately, not in acute distress Cardiovascular: Regular rate and rhythm, S1/S2 + Respiratory: Clear to auscultation bilaterally, no wheezing Abdominal: Soft, non tender, non distended, bowel sounds +, no guarding Extremities: no edema, no cyanosis, pulses palpable bilaterally DP and PT Neuro: Nonfocal  Discharge Instructions  Discharge Instructions    Call MD for:  persistant nausea and vomiting    Complete by:  As directed    Call MD for:  redness, tenderness, or signs of infection (pain, swelling, redness, odor or green/yellow discharge around incision site)    Complete by:  As directed    Call MD for:  severe uncontrolled pain    Complete by:  As directed    Diet - low sodium heart healthy    Complete by:  As directed    Discharge instructions    Complete by:  As directed    Per urology continue foley exchange every 1  month.   Increase activity slowly    Complete by:  As directed      Allergies as of 02/27/2017      Reactions   Ciprofloxacin Rash   Citalopram Hydrobromide Other (See Comments)   Shaky inside   Sulfa Antibiotics Rash      Medication List    TAKE these medications   acetaminophen 325 MG tablet Commonly known as:  TYLENOL Take 2 tablets (650 mg total) by mouth every 6 (six) hours as needed for mild pain (or Fever >/= 101).   Acidophilus/Goat Milk  Caps Take 1 capsule by mouth daily.   atorvastatin 20 MG tablet Commonly known as:  LIPITOR Take 1 tablet (20 mg total) by mouth daily at 6 PM.   clopidogrel 75 MG tablet Commonly known as:  PLAVIX Take 75 mg by mouth daily.   DESITIN 13 % Crea Generic drug:  Zinc Oxide Apply 1 application topically 3 (three) times daily.   feeding supplement (ENSURE ENLIVE) Liqd Take 237 mLs by mouth 2 (two) times daily between meals.   levothyroxine 150 MCG tablet Commonly known as:  SYNTHROID, LEVOTHROID Take 150 mcg by mouth daily before breakfast.   miconazole 2 % cream Commonly known as:  MICOTIN Apply 1 application topically 2 (two) times daily.   multivitamin tablet Take 1 tablet by mouth daily.   sertraline 50 MG tablet Commonly known as:  ZOLOFT Take 50 mg by mouth daily.      Follow-up Information    [name], MD Follow up.   Specialty:  Family Medicine Contact information: [place] [address] [phone]            The results of significant diagnostics from this hospitalization (including imaging, microbiology, ancillary and laboratory) are listed below for reference.    Significant Diagnostic Studies: Dg Chest 2 View  Result Date: 02/03/2017 CLINICAL DATA:  Followup pulmonary infiltrate EXAM: CHEST  2 VIEW COMPARISON:  02/02/2017.  05/17/2016. FINDINGS: Heart size is normal. There is aortic atherosclerosis. There chronic interstitial lung markings but no sign of active infiltrate, mass, effusion or collapse. Chronic degenerative changes affect the spine. IMPRESSION: No active disease. Chronic interstitial lung markings. Aortic atherosclerosis. Area questioned in the left lower lobe yesterday looks clear today. Electronically Signed   By: Nelson Chimes M.D.   On: 02/03/2017 12:55   Ct Head Wo Contrast  Result Date: 02/24/2017 CLINICAL DATA:  Altered mental status, unresponsive EXAM: CT HEAD WITHOUT CONTRAST TECHNIQUE: Contiguous  axial images were obtained from the base of the skull through the vertex without intravenous contrast. COMPARISON:  MRI brain dated 02/03/2017 FINDINGS: Brain: No evidence of acute infarction, hemorrhage, hydrocephalus, extra-axial collection or mass lesion/mass effect. Subcortical white matter and periventricular small vessel ischemic changes. Mild global cortical atrophy. Vascular: Intracranial atherosclerosis. Skull: Normal. Negative for fracture or focal lesion. Sinuses/Orbits: The visualized paranasal sinuses are essentially clear. The mastoid air cells are unopacified. Other: None. IMPRESSION: No evidence of acute intracranial abnormality. Atrophy with small vessel ischemic changes. Electronically Signed   By: Julian Hy M.D.   On: 02/24/2017 10:54   Ct Head Wo Contrast  Result Date: 02/02/2017 CLINICAL DATA:  83 year old female with history of altered mental status. Dementia. EXAM: CT HEAD WITHOUT CONTRAST TECHNIQUE: Contiguous axial images were obtained from the base of the skull through the vertex without intravenous contrast. COMPARISON:  Head CT 05/17/2016.  Brain MRI 05/17/2016. FINDINGS: Comment:  Study is slightly limited by considerable patient motion. Brain:  Patchy and confluent areas of decreased attenuation are noted throughout the deep and periventricular white matter of the cerebral hemispheres bilaterally, compatible with severe chronic microvascular ischemic disease. No evidence of acute infarction, hemorrhage, hydrocephalus, extra-axial collection or mass lesion/mass effect. Vascular: No hyperdense vessel or unexpected calcification. Skull: Normal. Negative for fracture or focal lesion. Sinuses/Orbits: No acute finding. Other: None. IMPRESSION: 1. No acute intracranial abnormalities on today's motion limited examination. 2. Extensive chronic microvascular ischemic changes in the cerebral white matter, as above. Electronically Signed   By: Vinnie Langton M.D.   On: 02/02/2017 18:35    Ct Chest Wo Contrast  Result Date: 02/04/2017 CLINICAL DATA:  Shortness of Breath EXAM: CT CHEST WITHOUT CONTRAST TECHNIQUE: Multidetector CT imaging of the chest was performed following the standard protocol without IV contrast. COMPARISON:  Chest x-ray 02/03/2017 FINDINGS: Cardiovascular: Heart is normal size. Dense diffuse coronary artery calcifications. Moderate aortic calcifications. No evidence of aneurysm. Mediastinum/Nodes: No mediastinal, hilar, or axillary adenopathy. Trachea and esophagus unremarkable. Lungs/Pleura: Dependent opacities in the lower lungs bilaterally, likely dependent atelectasis. No visible effusions. Upper Abdomen: Prior cholecystectomy. Nonobstructing stone in the upper pole of the right kidney is partially imaged. Aortic calcifications. Musculoskeletal: Chest wall soft tissues are unremarkable. No acute bony abnormality. Degenerative changes throughout the thoracic spine. IMPRESSION: Dependent atelectasis in the lower lobes bilaterally. Advanced coronary artery disease, aortic atherosclerosis. No acute cardiopulmonary disease. Prior cholecystectomy. Electronically Signed   By: Rolm Baptise M.D.   On: 02/04/2017 08:52   Mr Brain Wo Contrast  Result Date: 02/03/2017 CLINICAL DATA:  Confusion and slurred speech EXAM: MRI HEAD WITHOUT CONTRAST TECHNIQUE: Multiplanar, multiecho pulse sequences of the brain and surrounding structures were obtained without intravenous contrast. COMPARISON:  Head CT 02/02/2017 Brain MRI 05/17/2016 FINDINGS: Brain: No focal diffusion restriction to indicate acute infarct. No intraparenchymal hemorrhage. There is diffuse confluent hyperintense T2-weighted signal within the periventricular and deep white matter, most often seen in the setting of chronic microvascular ischemia. No mass lesion or midline shift. No hydrocephalus or extra-axial fluid collection. The midline structures are normal. No age advanced or lobar predominant atrophy. Vascular: Major  intracranial arterial and venous sinus flow voids are preserved. No evidence of chronic microhemorrhage or amyloid angiopathy. Skull and upper cervical spine: The visualized skull base, calvarium, upper cervical spine and extracranial soft tissues are normal. Sinuses/Orbits: No fluid levels or advanced mucosal thickening. No mastoid effusion. Normal orbits. IMPRESSION: Severe chronic microvascular ischemia without acute intracranial abnormality. Electronically Signed   By: Ulyses Jarred M.D.   On: 02/03/2017 04:06   US Renal  Result Date: 02/25/2017 CLINICAL DATA:  Frequent UTI EXAM: RENAL / URINARY TRACT ULTRASOUND COMPLETE COMPARISON:  CT scan 12/ 31/14 FINDINGS: Right Kidney: Length: 10.5 cm. Normal echogenicity. There is a elongated probable shadowing calcification in upper pole of the right kidney measures at least 1.6 cm. Nonobstructive calculus cannot be excluded. Left Kidney: Length: 10 cm. Echogenicity within normal limits. No mass or hydronephrosis visualized. Bladder: Foley catheter within decompressed urinary bladder. IMPRESSION: 1. Probable nonobstructive nephrolithiasis with a elongated calcification in upper pole of the right kidney measures at least 1.6 cm. No hydronephrosis. 2. Foley catheter within decompressed urinary bladder. Electronically Signed   By: Lahoma Crocker M.D.   On: 02/25/2017 14:37   Dg Pelvis Portable  Result Date: 02/25/2017 CLINICAL DATA:  Hip pain. EXAM: PORTABLE PELVIS 1-2 VIEWS COMPARISON:  09/24/2014 FINDINGS: A left total hip arthroplasty is noted. A screw penetrates the acetabulum, unchanged. Moderate osteopenia is  noted. Left hip appears located on this single view. Remote right superior and inferior pubic rami fractures are noted. Advanced degenerative changes in the right hip have progressed. No acute abnormality is present. IMPRESSION: 1. Progressive advanced degenerative changes in the right hip. 2. Remote healed fracture of the right superior and inferior pubic  ramus. 3. Left total hip arthroplasty is stable. Electronically Signed   By: San Morelle M.D.   On: 02/25/2017 12:23   Dg Chest Port 1 View  Result Date: 02/24/2017 CLINICAL DATA:  Patient is unresponsive. EXAM: PORTABLE CHEST 1 VIEW COMPARISON:  February 03, 2017 FINDINGS: The heart, hila, mediastinum, lungs, and pleura are unremarkable. IMPRESSION: No active disease. Electronically Signed   By: Dorise Bullion III M.D   On: 02/24/2017 14:11   Dg Chest Port 1 View  Result Date: 02/02/2017 CLINICAL DATA:  Acute mental status change. EXAM: PORTABLE CHEST 1 VIEW COMPARISON:  May 17, 2016 FINDINGS: No pneumothorax. The cardiomediastinal silhouette is stable with a tortuous thoracic aorta. Mild increased interstitial markings. Mild hazy opacity in left base. IMPRESSION: Suggested mild pulmonary venous congestion. Subtle but more focal opacity in the left base could represent than early infiltrate. Recommend follow-up to resolution. Electronically Signed   By: Dorise Bullion III M.D   On: 02/02/2017 17:58    Microbiology: Recent Results (from the past 240 hour(s))  Culture, blood (routine x 2)     Status: None (Preliminary result)   Collection Time: 02/24/17 10:44 AM  Result Value Ref Range Status   Specimen Description BLOOD LEFT ANTECUBITAL  Final   Special Requests IN PEDIATRIC BOTTLE Blood Culture adequate volume  Final   Culture   Final    NO GROWTH 3 DAYS Performed at [facility], [address] [place], [place] [address]    Report Status PENDING  Incomplete  Culture, blood (routine x 2)     Status: None (Preliminary result)   Collection Time: 02/24/17 11:32 AM  Result Value Ref Range Status   Specimen Description BLOOD LEFT ANTECUBITAL  Final   Special Requests   Final    BOTTLES DRAWN AEROBIC AND ANAEROBIC Blood Culture adequate volume   Culture   Final    NO GROWTH 3 DAYS Performed at [facility], [place] [address] [place], [place] [address]    Report Status  PENDING  Incomplete  Urine culture     Status: Abnormal   Collection Time: 02/24/17 11:32 AM  Result Value Ref Range Status   Specimen Description URINE, RANDOM  Final   Special Requests NONE  Final   Culture MULTIPLE SPECIES PRESENT, SUGGEST RECOLLECTION (A)  Final   Report Status 02/26/2017 FINAL  Final     Labs: Basic Metabolic Panel:  Recent Labs Lab 02/24/17 1024 02/25/17 0624 02/26/17 0616 02/27/17 0640  NA 141 140 142 140  K 3.9 3.7 3.2* 3.6  CL 108 110 112* 110  CO2 24 24 22 23   GLUCOSE 122* 98 87 107*  BUN 17 11 6  5*  CREATININE 0.61 0.47 0.43* 0.39*  CALCIUM 9.0 8.4* 8.1* 8.5*   Liver Function Tests:  Recent Labs Lab 02/24/17 1024  AST 19  ALT 13*  ALKPHOS 92  BILITOT 0.9  PROT 6.9  ALBUMIN 3.5   No results for input(s): LIPASE, AMYLASE in the last 168 hours. No results for input(s): AMMONIA in the last 168 hours. CBC:  Recent Labs Lab 02/24/17 1020 02/25/17 0624 02/26/17 0616  WBC 11.6* 7.9 6.9  NEUTROABS 8.7*  --   --   HGB 12.4 11.2* 10.1*  HCT 38.1 35.0* 31.7*  MCV 87.4 88.6 86.1  PLT 306 272 264   Cardiac Enzymes: No results for input(s): CKTOTAL, CKMB, CKMBINDEX, TROPONINI in the last 168 hours. BNP: BNP (last 3 results)  Recent Labs  02/02/17 2350  BNP 87.3    ProBNP (last 3 results) No results for input(s): PROBNP in the last 8760 hours.  CBG:  Recent Labs Lab 02/24/17 1007  GLUCAP 97

## 2017-02-27 NOTE — Progress Notes (Signed)
  Speech Language Pathology Treatment: Dysphagia  Patient Details Name: [name] MRN: [id number] DOB: 09-Sep-1931 Today's Date: 02/27/2017 Time: 8185-6314 SLP Time Calculation (min) (ACUTE ONLY): 16 min  Assessment / Plan / Recommendation Clinical Impression  Pt sitting upright in bed upon SLP arrival.  Willing to accept minimal intake of spaghetti, beans, pudding and cranberry juice. Pt required total assist to eat - not attempting to feed herself or aid SLP despite total assist.  NO indication of airway compromise with po but intermittent delayed swallow present. Pt barely opened mouth to accept po and talked during entire meal - with decreased phonatory strength and rapid rate of speech.  She was much more willing to accept pudding (suspect due to gustatory sensation - sweet) and would recommend maximize foods with sweet flavor for intake.    Given pt's CVAs, dementia, Parkinsons' disease - recommend continue diet with strict precautions.  Pt will benefit from follow up SLP to help maximize intake with least aspiration risk.  If po remains poor in the future, pt may benefit from a palliative referral given progressive nature of her medical conditions.     HPI HPI: [name] a 83 y.o.femalewith a PMH significant for advanced dementia, hx of stroke (most recently 04/2016), urinary retention, and hypothyroidism who presents with 1 week confusion and slurred speech. Per family, about 2 weeks ago, she started to develop a foul smell in her room like urine, complained to her daughter about pain at her foley. CXR showed no active disease. Chronic interstitial lung markings. Aortic atherosclerosis. Area questioned in the left lower lobe on 3/17 looks clear today (3/18). MRI of brain showed severe chronic microvascular ischemia without acute intracranial abnormality.      SLP Plan  Continue with current plan of care       Recommendations  Diet recommendations: Dysphagia 2 (fine chop);Thin  liquid Liquids provided via: Cup;Straw Medication Administration: Whole meds with puree Compensations: Minimize environmental distractions;Slow rate;Small sips/bites Postural Changes and/or Swallow Maneuvers: Seated upright 90 degrees;Upright 30-60 min after meal                Oral Care Recommendations: Oral care BID Follow up Recommendations: Skilled Nursing facility SLP Visit Diagnosis: Dysphagia, unspecified (R13.10) Plan: Continue with current plan of care       [place], [place] [facility] SLP [phone]

## 2017-02-27 NOTE — NC FL2 (Signed)
MEDICAID FL2 LEVEL OF CARE SCREENING TOOL     IDENTIFICATION  Patient Name: [name] Birthdate: 11/19/1931 Sex: female Admission Date (Current Location): 02/24/2017  [place] and [place] Number:  Herbalist and Address:  [facility],  [place], [place]      Provider Number: [id number]  Attending Physician Name and Address:  Robbie Lis, MD  Relative Name and Phone Number:  [name] daughter, [phone]    Current Level of Care: Hospital Recommended Level of Care: [place] Prior Approval Number:    Date Approved/Denied:   PASRR Number: [id number]  Discharge Plan: SNF    Current Diagnoses: Patient Active Problem List   Diagnosis Date Noted  . Goals of care, counseling/discussion   . Palliative care by specialist   . Pressure injury of skin 02/03/2017  . Confusion 02/02/2017  . CVA (cerebral vascular accident) ([place]) 05/18/2016  . B12 deficiency 05/18/2016  . Encephalopathy acute 05/17/2016  . Encephalopathy 05/17/2016  . Acute UTI 05/17/2016  . Leukocytosis 05/17/2016  . Dehydration 05/17/2016  . H/O: CVA (cerebrovascular accident) 05/17/2016  . UTI (urinary tract infection) due to urinary indwelling Foley catheter ([place]) 05/17/2016  . Acute encephalopathy 05/17/2016  . Postoperative anemia due to acute blood loss 09/28/2014  . Urinary retention 09/28/2014  . Infection of urinary tract 09/28/2014  . Severe dementia 09/28/2014  . Left displaced femoral neck fracture ([place]) 09/23/2014  . Vascular dementia 09/17/2014  . 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 Essential hypertension, benign 07/22/2013  . Acute posthemorrhagic anemia 07/22/2013  . Anemia 07/03/2013  . Constipation 07/03/2013  . Insomnia 07/03/2013  . Recurrent UTI 03/27/2013  . Preoperative evaluation to rule out surgical contraindication 12/24/2012  . Cognitive impairment 04/02/2012  . Osteoarthritis of knee 03/12/2011  .  Hypothyroid 02/26/2011  . TIA (transient ischemic attack) 02/26/2011  . Depression 02/26/2011  . Hyperlipemia 02/26/2011  . Alcohol abuse 02/26/2011    Orientation RESPIRATION BLADDER Height & Weight     Self  Normal Incontinent, Indwelling catheter Weight: 133 lb (60.3 kg) Height:  5\' 2"  (157.5 cm)  BEHAVIORAL SYMPTOMS/MOOD NEUROLOGICAL BOWEL NUTRITION STATUS      Incontinent Diet (Dysphagia 2 (Fine chop);Thin liquid )  AMBULATORY STATUS COMMUNICATION OF NEEDS Skin   Limited Assist Verbally Other (Comment), PU Stage and Appropriate Care (Deep Tissue injury on Sacrum, pressure injury stage II on thigh and buttocks)                       Personal Care Assistance Level of Assistance  Bathing, Feeding, Dressing Bathing Assistance: Maximum assistance Feeding assistance: Limited assistance Dressing Assistance: Limited assistance     Functional Limitations Info             SPECIAL CARE FACTORS FREQUENCY  OT (By licensed OT)     PT Frequency: 5 OT Frequency: 5     Speech Therapy Frequency: 2x      Contractures Contractures Info: Not present    Additional Factors Info  Isolation Precautions, Code Status Code Status Info: Full Allergies Info: Ciprofloxacin, Citalopram Hydrobromide, Sulfa Antibiotics     Isolation Precautions Info: MRSA      Current Medications (02/27/2017):  This is the current hospital active medication list Current Facility-Administered Medications  Medication Dose Route Frequency Provider Last Rate Last Dose  . acetaminophen (TYLENOL) tablet 650 mg  650 mg Oral Q6H PRN Debbe Odea, MD   650 mg at 02/26/17 [phone]  Or  . acetaminophen (TYLENOL) suppository 650 mg  650 mg Rectal Q6H PRN Debbe Odea, MD   650 mg at 02/24/17 1911  . acidophilus (RISAQUAD) capsule 1 capsule  1 capsule Oral Daily Debbe Odea, MD   1 capsule at 02/27/17 1036  . atorvastatin (LIPITOR) tablet 20 mg  20 mg Oral q1800 Debbe Odea, MD   20 mg at 02/26/17 1815  .  clopidogrel (PLAVIX) tablet 75 mg  75 mg Oral Daily Debbe Odea, MD   75 mg at 02/27/17 1036  . enoxaparin (LOVENOX) injection 40 mg  40 mg Subcutaneous Q24H Debbe Odea, MD   40 mg at 02/26/17 2046  . feeding supplement (ENSURE ENLIVE) (ENSURE ENLIVE) liquid 237 mL  237 mL Oral BID BM Debbe Odea, MD   237 mL at 02/27/17 1036  . levothyroxine (SYNTHROID, LEVOTHROID) tablet 150 mcg  150 mcg Oral QAC breakfast Debbe Odea, MD   150 mcg at 02/27/17 0823  . multivitamin with minerals tablet 1 tablet  1 tablet Oral Daily Debbe Odea, MD   1 tablet at 02/27/17 1036  . ondansetron (ZOFRAN) tablet 4 mg  4 mg Oral Q6H PRN Debbe Odea, MD       Or  . ondansetron (ZOFRAN) injection 4 mg  4 mg Intravenous Q6H PRN Debbe Odea, MD      . sertraline (ZOLOFT) tablet 50 mg  50 mg Oral Daily Debbe Odea, MD   50 mg at 02/27/17 1036  . zinc oxide 20 % ointment   Topical TID Debbe Odea, MD   1 application at 39/03/00 1036     Discharge Medications: Please see discharge summary for a list of discharge medications.  Relevant Imaging Results:  Relevant Lab Results:   Additional Information SSN: [id number]  Lia Hopping, LCSW

## 2017-02-27 NOTE — Progress Notes (Signed)
Occupational Therapy Treatment Patient Details Name: [name] MRN: [id number] DOB: 12-06-1930 Today's Date: 02/27/2017    History of present illness 83 y.o. female  with past medical history of dementia, Parkinson's, stroke, urinary retention with chronic foley, recurrent UTIs, and hypothyroidism. She presented from ALF with lethargy and confusion.   OT comments  Pt tolerated PROM, did not participate in Rankin.  This OT did not do evaluation, but it appears she is not as rigid.  Pt tolerated some hand-over-hand assistance for self-feeding but she is total A at this time.    Follow Up Recommendations  SNF (unless memory care can manage her at this level)    Equipment Recommendations  None recommended by OT    Recommendations for Other Services      Precautions / Restrictions Precautions Precautions: Fall       Mobility Bed Mobility                  Transfers                      Balance                                           ADL either performed or assessed with clinical judgement   ADL   Eating/Feeding: Total assistance (HOB raised)   Grooming: Wash/dry hands;Wash/dry face;Total assistance;Bed level (HOB raised)                                 General ADL Comments: attempted self feeding; Pt had difficulty holding spoon--may benefit from built up foam.  Pt initially held cup, but let go when this was up near face. She accepted a very small bit of pudding and 4 sips of juice.   She resisted washing hands and face with warm water.       Vision       Perception     Praxis      Cognition Arousal/Alertness: Awake/alert Behavior During Therapy: WFL for tasks assessed/performed;Flat affect Overall Cognitive Status: History of cognitive impairments - at baseline Area of Impairment: Attention;Following commands                   Current Attention Level: Sustained Memory: Decreased short-term  memory Following Commands: Follows one step commands inconsistently;Follows one step commands with increased time   Awareness: Intellectual Problem Solving: Slow processing;Decreased initiation;Difficulty sequencing;Requires verbal cues;Requires tactile cues General Comments: not oriented. Asking for daughter then speaking of earlier times        Exercises Other Exercises Other Exercises: PROM for bil shoulders and hands. R shoulder limited to 40 degrees FF and abduction and L was around 25 for both FF and abduction.    Fingers able to be stretched out; she keeps them in slight flexion at rest.     Shoulder Instructions       General Comments      Pertinent Vitals/ Pain       Pain Assessment: No/denies pain--no signs of pain/discomfort during session  Home Living                                          Prior Functioning/Environment  Frequency  Min 2X/week        Progress Toward Goals  OT Goals(current goals can now be found in the care plan section)  Progress towards OT goals: Progressing toward goals (slowly)  Acute Rehab OT Goals Patient Stated Goal: No goals stated  Plan      Co-evaluation                 End of Session        Activity Tolerance Patient tolerated treatment well   Patient Left in bed;with call bell/phone within reach;with nursing/sitter in room   Nurse Communication          Time: 2919-1660 OT Time Calculation (min): 17 min  Charges: OT General Charges $OT Visit: 1 Procedure OT Treatments $Self Care/Home Management : 8-22 mins  Lesle Chris, OTR/L [phone] 02/27/2017   [name] 02/27/2017, 1:46 PM

## 2017-02-27 NOTE — Care Management Note (Signed)
Case Management Note  Patient Details  Name: [name] MRN: [id number] Date of Birth: January 21, 1931  Subjective/Objective:        Urinary retention            Action/Plan:Date:  February 27, 2017 Chart reviewed for concurrent status and case management needs. Will continue to follow patient progress. Discharge Planning: following for needs Expected discharge date: [id number] Velva Harman, BSN, [place], [place]   Expected Discharge Date:  02/28/17               Expected Discharge Plan:  [place]  In-House Referral:  Clinical Social Work  Discharge planning Services  CM Consult  Post Acute Care Choice:    Choice offered to:     DME Arranged:    DME Agency:     HH Arranged:    [place] Agency:     Status of Service:  In process, will continue to follow  If discussed at Long Length of Stay Meetings, dates discussed:    Additional Comments:  Leeroy Cha, RN 02/27/2017, 10:01 AM

## 2017-02-28 NOTE — Progress Notes (Signed)
Pt is medically stable for discharge to SNF today. Please refer to discharge summary completed 02/27/2017. [name] [facility] [phone]

## 2017-02-28 NOTE — Progress Notes (Addendum)
Lacie from ALF met with patient at bedside today, and at this time the facility feels they cannot mange patient level of care.  CSW explained to daughters facility recommendations for patient to go to SNF to get stronger then return to ALF-memory care.  CSW working with family at this time to find SNF, CSW worked extensively with family today to find SNF.  Patient family agreeable to [facility]. Authorization started.   Kathrin Greathouse, Latanya Presser, MSW Clinical Social Worker 5E and Psychiatric Service Line [phone] 02/28/2017  11:00 AM

## 2017-02-28 NOTE — Progress Notes (Signed)
Physical Therapy Treatment Patient Details Name: [name] MRN: [id number] DOB: 1931-02-02 Today's Date: 02/28/2017    History of Present Illness 83 y.o. female  with past medical history of dementia, Parkinson's, stroke, urinary retention with chronic foley, recurrent UTIs, and hypothyroidism. She presented from ALF with lethargy and confusion.    PT Comments    Pt awake with good eye contact but still demonstrates AMS speaking in random sentences.  Assisted from supine to EOB required Total Assist pt 0% due to rigidity and cognition.  Positioned pt upright EOB to attempt trunk stability and attempt feeding.  B shoulders very limited.  Pt was unable to wipe her mouth even with hand over hand cueing/assist.  Pt present with severe posterior lean with rigid hip flexors and knee flexors.  Pt was unable to place B feet on floor present with B ankle PF. Transferred pt to recliner via "Bear Hug" 1/4 pivot Total Assist pt 5%.  Positioned in recliner with multiple pillows.  Rec nursing use a lift to assist pt back to bed. Pt will need SNF level post hospital.   Follow Up Recommendations  SNF     Equipment Recommendations  None recommended by PT    Recommendations for Other Services       Precautions / Restrictions Precautions Precaution Comments: Dementia, Parkinson's Restrictions Weight Bearing Restrictions: No    Mobility  Bed Mobility Overal bed mobility: Needs Assistance Bed Mobility: Supine to Sit     Supine to sit: Total assist (pt 0%)     General bed mobility comments: very rigid with limited hip and knee flex.  Sat EOB required MAX assist to prevent posterior lean.  Unable to completely place B LE's on floor.  B ankles PF.  Sat EOB x 7 min with terapist support on her Left to attempt self feeding and trunk control.    Transfers Overall transfer level: Needs assistance Equipment used: None Transfers: Stand Pivot Transfers           General transfer comment: "Bear  Hug" 1/4 pivot from elevated bed to recliner Total Assist pt offered 5%.  Very rigid.  B knees ADducted, B ankles PF.    Ambulation/Gait                 Stairs            Wheelchair Mobility    Modified Rankin (Stroke Patients Only)       Balance                                            Cognition Arousal/Alertness: Awake/alert   Overall Cognitive Status: Difficult to assess Area of Impairment: Attention;Following commands                   Current Attention Level: Sustained           General Comments: eyes open and watching staff as they entered the room.  Speaks in random sentences.        Exercises      General Comments        Pertinent Vitals/Pain Pain Assessment: Faces Faces Pain Scale: Hurts little more Pain Location: R shoulder and back    Home Living                      Prior Function  PT Goals (current goals can now be found in the care plan section) Progress towards PT goals: Progressing toward goals (slowly)    Frequency    Min 2X/week      PT Plan Current plan remains appropriate    Co-evaluation             End of Session Equipment Utilized During Treatment: Gait belt Activity Tolerance: Treatment limited secondary to medical complications (Comment) (AMS, Parkinson's, s/p CVA) Patient left: in chair;with nursing/sitter in room;with call bell/phone within reach Nurse Communication: Mobility status;Need for lift equipment PT Visit Diagnosis: Muscle weakness (generalized) (M62.81);Difficulty in walking, not elsewhere classified (R26.2)     Time: 5747-3403 PT Time Calculation (min) (ACUTE ONLY): 25 min  Charges:  $Therapeutic Activity: 23-37 mins                    G Codes:       Rica Koyanagi  PTA [facility]  Acute  Rehab Pager      [phone]

## 2017-02-28 NOTE — Progress Notes (Addendum)
Date: February 28, 2017 Discharge orders review for case management needs.  Patient is planning to go to [facility] at discharge. Velva Harman, BSN, [place], CCM: [phone]

## 2017-03-01 LAB — CULTURE, BLOOD (ROUTINE X 2)
CULTURE: NO GROWTH
CULTURE: NO GROWTH
SPECIAL REQUESTS: ADEQUATE
Special Requests: ADEQUATE

## 2017-03-01 IMAGING — CT CT HEAD W/O CM
2 series · 15 of 30 positions shown, 19 images · non-contrast
Comparison: CT head 04/03/2013, 02/06/2011.  MRI brain 02/17/2011.

CLINICAL DATA: 84-year-old who fell at the [HOSPITAL] earlier
today, sustaining a laceration above the left eye. Patient currently
on Plavix therapy. Initial encounter.

EXAM:
CT HEAD WITHOUT CONTRAST
TECHNIQUE: Contiguous axial images were obtained from the base of the skull
through the vertex without intravenous contrast.

[Series 2: head w/o · axial · non-contrast · 0.45mm/px · z∈[-335,-215]mm · 13 of 29 slices shown, 17 images]
[im 3/29  brain]
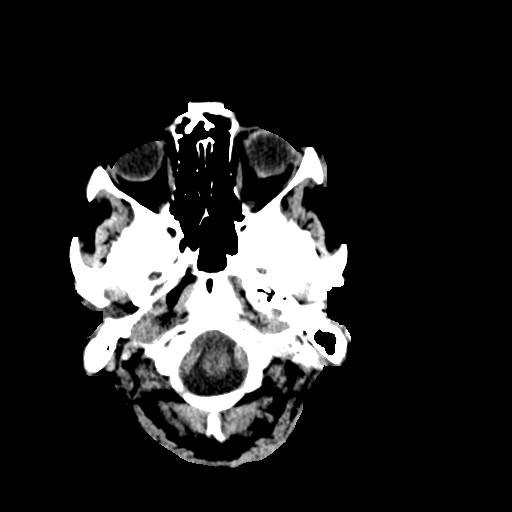
[im 3/29  bone]
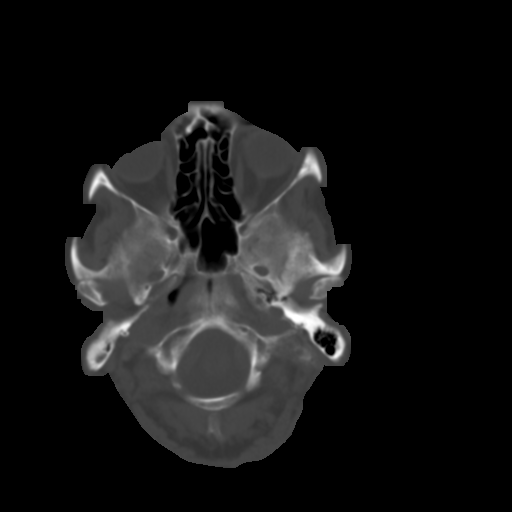
[im 5/29  brain]
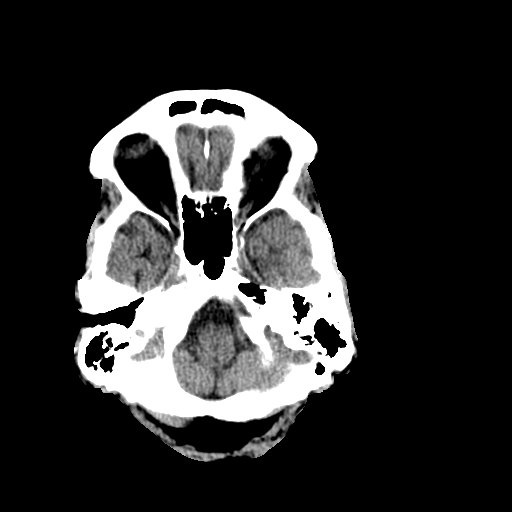
[im 7/29  brain]
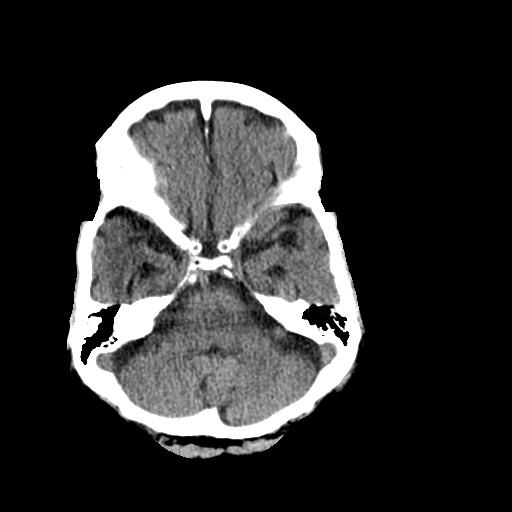
[im 9/29  brain]
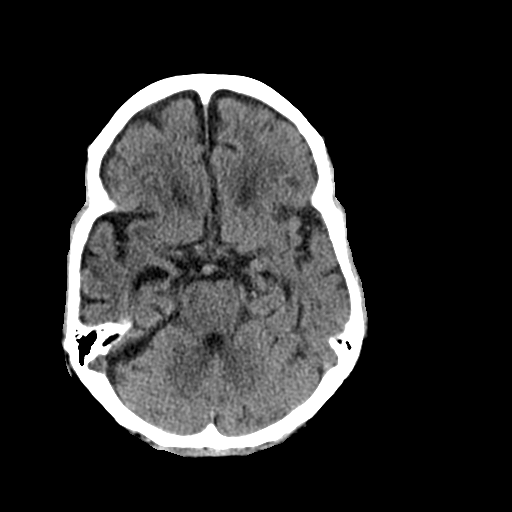
[im 11/29  brain]
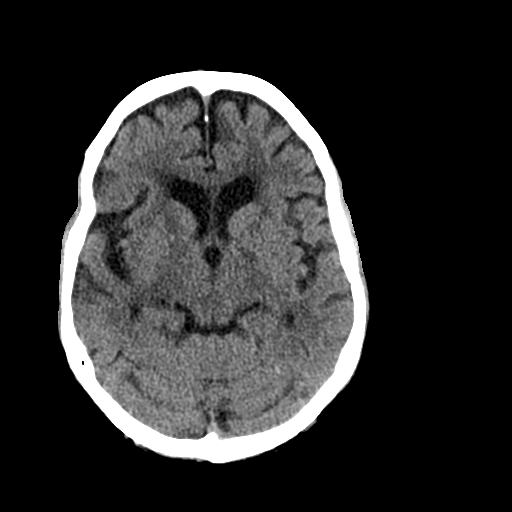
[im 11/29  bone]
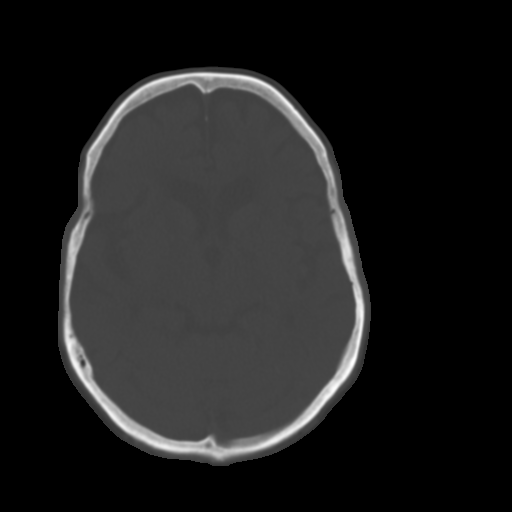
[im 13/29  brain]
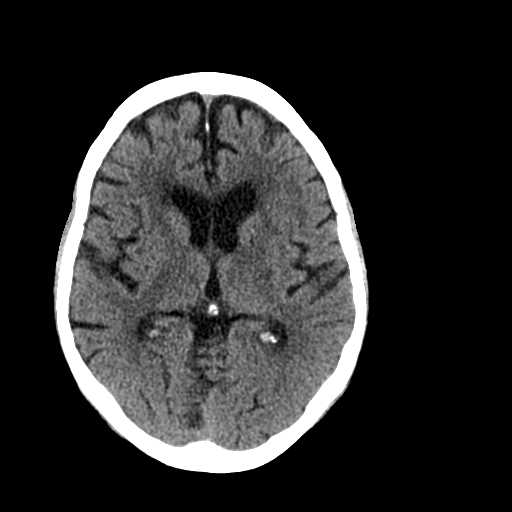
[im 15/29  brain]
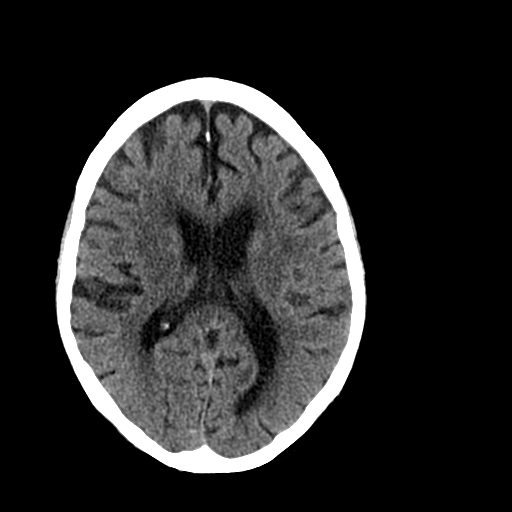
[im 17/29  brain]
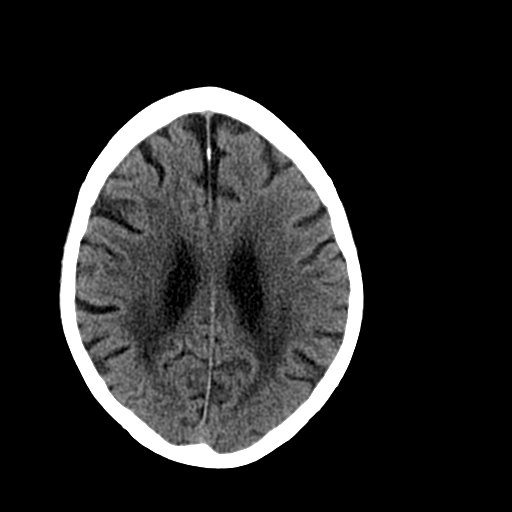
[im 19/29  brain]
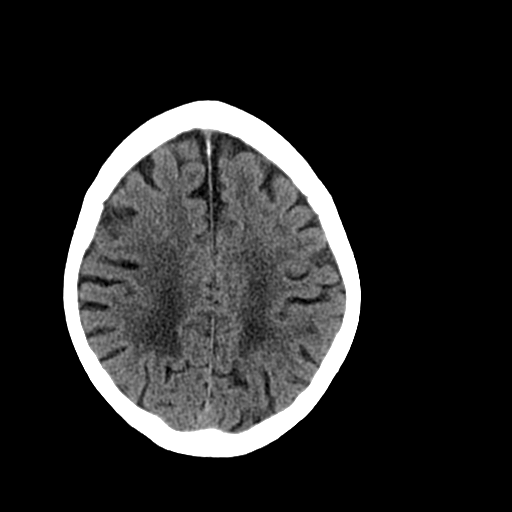
[im 19/29  bone]
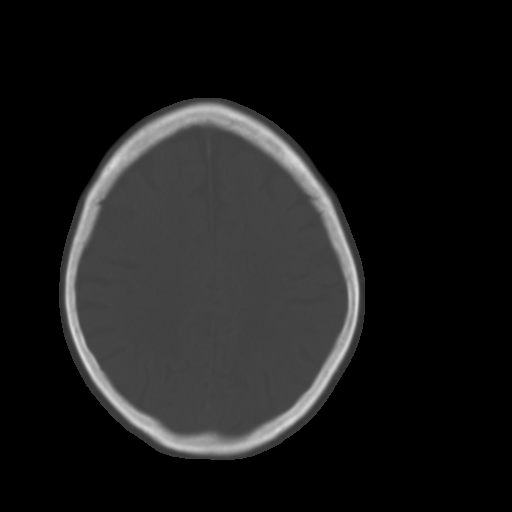
[im 21/29  brain]
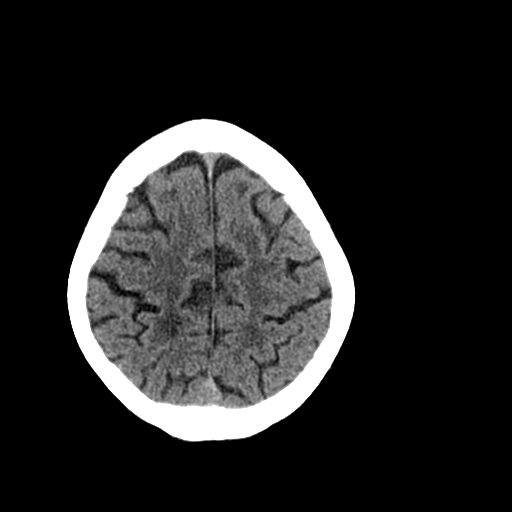
[im 23/29  brain]
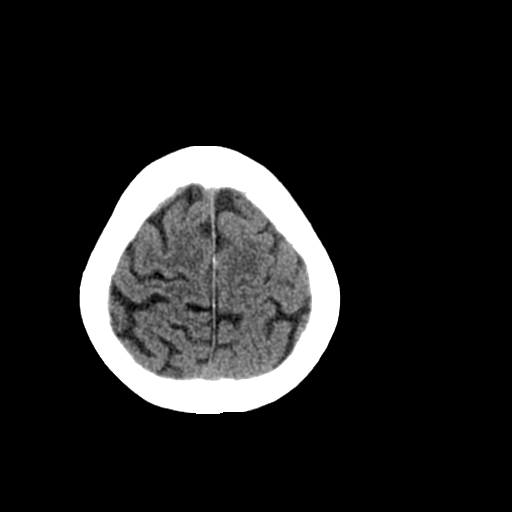
[im 25/29  brain]
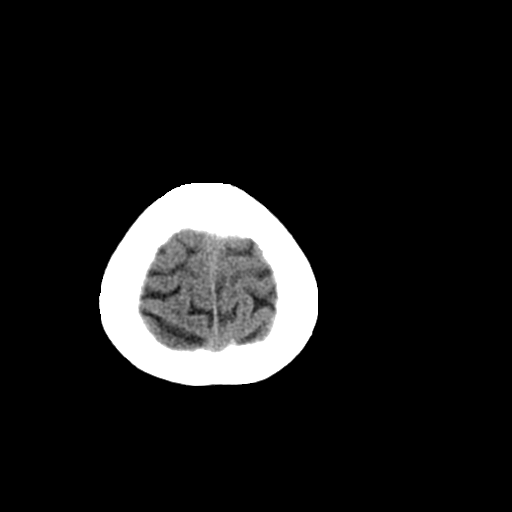
[im 27/29  brain]
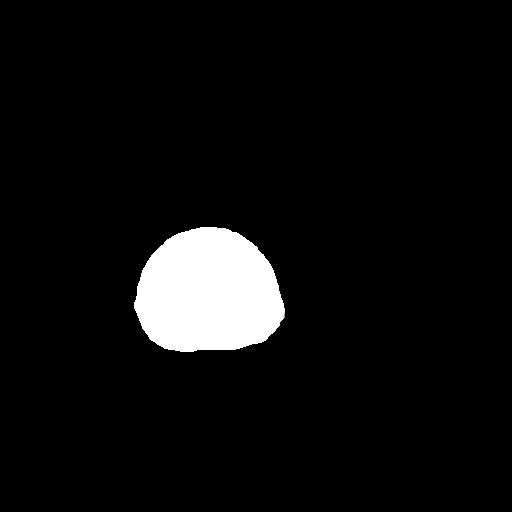
[im 27/29  bone]
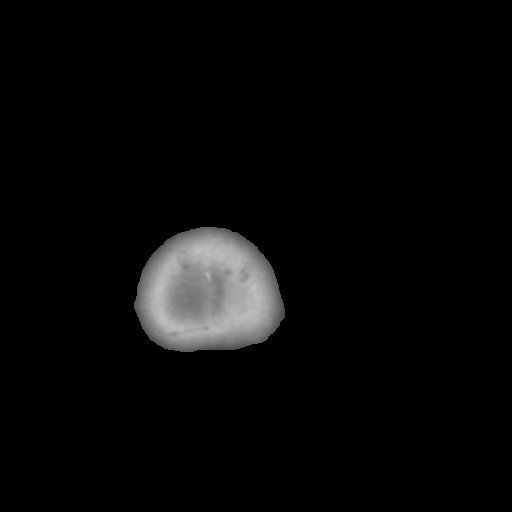

[Series 3: bone windows · axial · 0.45mm/px · z∈[-335,-315]mm · 2 of 29 slices shown]
[im 3/29  bone]
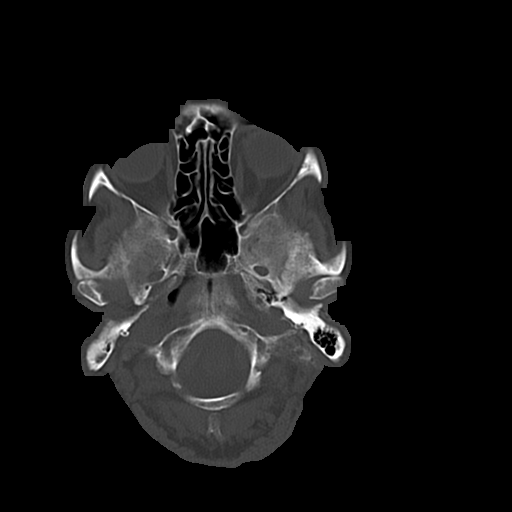
[im 7/29  bone]
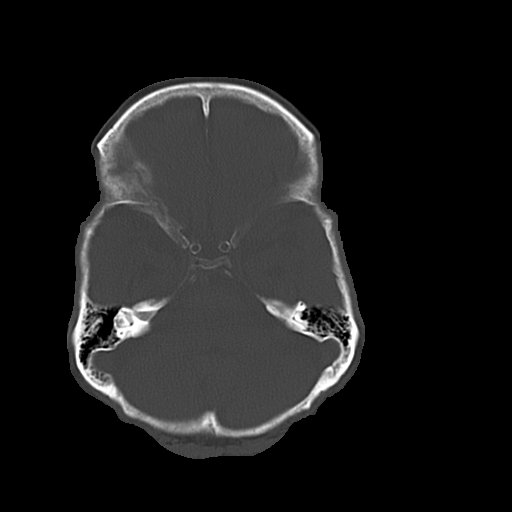

[15 of 30 positions shown; findings below may reference images not displayed]

FINDINGS: Moderate cortical and deep atrophy and mild cerebellar atrophy,
unchanged. Severe changes of small vessel disease of the white
matter diffusely, unchanged since 5908, progressive since 3903. No
mass lesion. No midline shift. No acute hemorrhage or hematoma. No
extra-axial fluid collections. No evidence of acute infarction.

No skull fracture or other focal osseous abnormality involving the
skull. Visualized paranasal sinuses, bilateral mastoid air cells and
bilateral middle ear cavities well-aerated. Bilateral carotid siphon
and vertebral artery atherosclerosis.
IMPRESSION: 1. No acute intracranial abnormality.
2. Stable moderate cortical and deep atrophy and mild cerebellar
atrophy. Stable severe chronic microvascular ischemic changes of the
white matter.

## 2017-03-01 NOTE — Progress Notes (Addendum)
Nutrition Follow Up Note   INTERVENTION:   Ensure Enlive po BID, each supplement provides 350 kcal and 20 grams of protein  Magic cup TID with meals, each supplement provides 290 kcal and 9 grams of protein  MVI  NUTRITION DIAGNOSIS:   Increased nutrient needs related to wound healing as evidenced by increased estimated needs from protein.  GOAL:   Patient will meet greater than or equal to 90% of their needs  MONITOR:   PO intake, Supplement acceptance, Labs, Weight trends, I & O's, Skin  ASSESSMENT:   83 y.o. female  with past medical history of dementia, Parkinson's, stroke, urinary retention with chronic foley, recurrent UTIs, and hypothyroidism. She presented from ALF with lethargy and confusion.  Pt medically stable for discharge; awaiting SNF placement. Pt continues to have poor appetite eating <25% meals but continues to eat ice cream and drink some Ensure. Pt remains on DYS 2/thin diet. No new wt on pt since admit. P  Diet Order:  DIET DYS 2 Room service appropriate? Yes; Fluid consistency: Thin Diet - low sodium heart healthy  Skin:   Stage I sacrum pressure injury, Stage II buttocks pressure injury, Stage II bilateral thigh pressure injuries  Last BM:  4/11  Height:   Ht Readings from Last 1 Encounters:  02/24/17 5\' 2"  (1.575 m)    Weight:   Wt Readings from Last 1 Encounters:  02/24/17 133 lb (60.3 kg)    Ideal Body Weight:  50 kg  BMI:  Body mass index is 24.33 kg/m.  Estimated Nutritional Needs:   Kcal:  1500-1700  Protein:  65-75g  Fluid:  1.5L/day  EDUCATION NEEDS:   No education needs identified at this time  Koleen Distance, RD, LDN Pager #[phone]

## 2017-03-01 NOTE — Progress Notes (Addendum)
Patient authorization still pending. CSW spoke to family and offered alternative to another facility with LOG, pending authorization. Patient daughter reports they only want [facility] becasue they are not familiar with facilities that will accept LOG. Patient family reports they would like to appeal patient discharge and stay. CSW inform RNCM Rhonda. RNCM inquired if diet had been changed. CSW spoke with nurse it is still Dysphagia 2 diet and ALF-memorycare will not accept patient.  5:30, Insurance company reqested [facility] send PT/OT notes for patient.  CSW updated patient daughter [name], Patent attorney, Weekend CSW contact information.    Kathrin Greathouse, Latanya Presser, MSW Clinical Social Worker 5E and Psychiatric Service Line [phone] 03/01/2017  5:35 PM

## 2017-03-01 NOTE — Care Management Important Message (Addendum)
Important Message  Patient Details IM Letter given to Suanne Marker to present to Patient Name: [name] MRN: [id number] Date of Birth: Jul 12, 1931   Medicare Important Message Given:  Yes    Kerin Salen 03/01/2017, 9:59 AMImportant Message  Patient Details  Name: [name] MRN: [id number] Date of Birth: 1931/04/28   Medicare Important Message Given:  Yes    Kerin Salen 03/01/2017, 9:59 AM

## 2017-03-02 NOTE — Clinical Social Work Note (Signed)
CSW spoke with [place] at [facility]. No word on [employer] auth. They have a bed for patient and will accept her when Josem Kaufmann is obtained.  Family is only wanting [employer] copy.  [employer] is closed on the weekends. CSW services will continue to monitor and assist family with placement.  Lorie Phenix. Pauline Good, [place] (weekend coverage)

## 2017-03-02 NOTE — Progress Notes (Signed)
Patient ID: [name], female   DOB: 1931-11-13, 83 y.o.   MRN: [id number]  PROGRESS NOTE    KRISHIKA BUGGE  OYD:[id number] DOB: June 29, 1931 DOA: 02/24/2017  PCP: [name], MD   Brief Narrative:  83 year old Caucasian female with a past medical history of dementia, likely vascular, stroke, urinary retention with a chronic foley for the past 2 years, frequent UTIs, hypothyroidism who presented from ALF to [facility] with lethargy and worsening confusion. She was found to have UTI.  Assessment & Plan:  Acute encephalopathy, toxic Secondary to UTI. Stable. Still altered, oriented to self only.  Urinary tract infection  Per GU, treat UTI if pt symptomatic only Per GU, no need for abx on discharge unless pt sx-ic  History of urinary retention with chronic indwelling Foley/right nephrolithiasis Foley catheter was replaced in the emergency department 4/9 Foley change in 1 month Follow up with GU per scheduled appt   Advanced dementia, likely vascular Stable   History of stroke Continue Plavix. Continue statin.  History of hypothyroidism Continue levothyroxin  Hypokalemia. Supplemented and WNL   DVT prophylaxis: Lovenox  Code Status: full code  Family Communication: not at the bedside this am Disposition Plan: awaiting placement    Subjective: No overnight events.   Objective: Vitals:   03/01/17 0359 03/01/17 1451 03/01/17 1954 03/02/17 0552  BP: (!) 114/48 (!) 144/63 133/61 (!) 138/57  Pulse: 71 74 88 70  Resp: 16 17  18   Temp: 98.3 F (36.8 C) 98.6 F (37 C) 99.3 F (37.4 C) 98.7 F (37.1 C)  TempSrc: Axillary Axillary Axillary Axillary  SpO2: 95% 95% 94% 94%  Weight:      Height:        Intake/Output Summary (Last 24 hours) at 03/02/17 0735 Last data filed at 03/02/17 0553  Gross per 24 hour  Intake               50 ml  Output              720 ml  Net             -670 ml   Filed Weights   02/24/17 0956  Weight: 60.3 kg (133 lb)     Examination:  General exam: Appears calm and comfortable  Respiratory system: Clear to auscultation. Respiratory effort normal. Cardiovascular system: S1 & S2 heard, RRR.  Gastrointestinal system: Abdomen is nondistended, soft and nontender. No organomegaly or masses felt. Normal bowel sounds heard. Central nervous system: not talking very much, not following commands Extremities: No edema, palpable pulses  Skin: moisture damage in groin area right side  Psychiatry: unable to adequately assess due to pt altered mental status   Data Reviewed: I have personally reviewed following labs and imaging studies  CBC:  Recent Labs Lab 02/24/17 1020 02/25/17 0624 02/26/17 0616  WBC 11.6* 7.9 6.9  NEUTROABS 8.7*  --   --   HGB 12.4 11.2* 10.1*  HCT 38.1 35.0* 31.7*  MCV 87.4 88.6 86.1  PLT 306 272 672   Basic Metabolic Panel:  Recent Labs Lab 02/24/17 1024 02/25/17 0624 02/26/17 0616 02/27/17 0640  NA 141 140 142 140  K 3.9 3.7 3.2* 3.6  CL 108 110 112* 110  CO2 24 24 22 23   GLUCOSE 122* 98 87 107*  BUN 17 11 6  5*  CREATININE 0.61 0.47 0.43* 0.39*  CALCIUM 9.0 8.4* 8.1* 8.5*   GFR: Estimated Creatinine Clearance: 44.8 mL/min (A) (by C-G formula based on SCr  of 0.39 mg/dL (L)). Liver Function Tests:  Recent Labs Lab 02/24/17 1024  AST 19  ALT 13*  ALKPHOS 92  BILITOT 0.9  PROT 6.9  ALBUMIN 3.5   No results for input(s): LIPASE, AMYLASE in the last 168 hours. No results for input(s): AMMONIA in the last 168 hours. Coagulation Profile: No results for input(s): INR, PROTIME in the last 168 hours. Cardiac Enzymes: No results for input(s): CKTOTAL, CKMB, CKMBINDEX, TROPONINI in the last 168 hours. BNP (last 3 results) No results for input(s): PROBNP in the last 8760 hours. HbA1C: No results for input(s): HGBA1C in the last 72 hours. CBG:  Recent Labs Lab 02/24/17 1007  GLUCAP 97   Lipid Profile: No results for input(s): CHOL, HDL, LDLCALC, TRIG,  CHOLHDL, LDLDIRECT in the last 72 hours. Thyroid Function Tests: No results for input(s): TSH, T4TOTAL, FREET4, T3FREE, THYROIDAB in the last 72 hours. Anemia Panel: No results for input(s): VITAMINB12, FOLATE, FERRITIN, TIBC, IRON, RETICCTPCT in the last 72 hours. Urine analysis:  Sepsis Labs: @LABRCNTIP (procalcitonin:4,lacticidven:4)    Recent Results (from the past 240 hour(s))  Culture, blood (routine x 2)     Status: None   Collection Time: 02/24/17 10:44 AM  Result Value Ref Range Status   Specimen Description BLOOD LEFT ANTECUBITAL  Final   Special Requests IN PEDIATRIC BOTTLE Blood Culture adequate volume  Final   Culture   Final    NO GROWTH 5 DAYS Performed at [facility], [place] [address], [place], [place] [address]    Report Status 03/01/2017 FINAL  Final  Culture, blood (routine x 2)     Status: None   Collection Time: 02/24/17 11:32 AM  Result Value Ref Range Status   Specimen Description BLOOD LEFT ANTECUBITAL  Final   Special Requests   Final    BOTTLES DRAWN AEROBIC AND ANAEROBIC Blood Culture adequate volume   Culture   Final    NO GROWTH 5 DAYS Performed at [facility], [place] [address], [place],  [address]    Report Status 03/01/2017 FINAL  Final  Urine culture     Status: Abnormal   Collection Time: 02/24/17 11:32 AM  Result Value Ref Range Status   Specimen Description URINE, RANDOM  Final   Special Requests NONE  Final   Culture MULTIPLE SPECIES PRESENT, SUGGEST RECOLLECTION (A)  Final   Report Status 02/26/2017 FINAL  Final      Radiology Studies: No results found.    Scheduled Meds: . acidophilus  1 capsule Oral Daily  . atorvastatin  20 mg Oral q1800  . clopidogrel  75 mg Oral Daily  . enoxaparin (LOVENOX) injection  40 mg Subcutaneous Q24H  . feeding supplement (ENSURE ENLIVE)  237 mL Oral BID BM  . levothyroxine  150 mcg Oral QAC breakfast  . multivitamin with minerals  1 tablet Oral Daily  . sertraline  50 mg Oral  Daily  . zinc oxide   Topical TID   Continuous Infusions:   LOS: 6 days    Time spent: 15 minutes  Greater than 50% of the time spent on counseling and coordinating the care.   Leisa Lenz, MD [facility] Pager [phone]  If 7PM-7AM, please contact night-coverage [email or web address] Password [facility] 03/02/2017, 7:35 AM

## 2017-03-03 LAB — BASIC METABOLIC PANEL
Anion gap: 8 (ref 5–15)
BUN: 18 mg/dL (ref 6–20)
CO2: 27 mmol/L (ref 22–32)
Calcium: 8.8 mg/dL — ABNORMAL LOW (ref 8.9–10.3)
Chloride: 105 mmol/L (ref 101–111)
Creatinine, Ser: 0.51 mg/dL (ref 0.44–1.00)
GFR calc Af Amer: >60 mL/min (ref >60)
GFR calc non Af Amer: >60 mL/min (ref >60)
Glucose, Bld: 113 mg/dL — ABNORMAL HIGH (ref 65–99)
Potassium: 4.2 mmol/L (ref 3.5–5.1)
Sodium: 140 mmol/L (ref 135–145)

## 2017-03-03 LAB — CBC
HCT: 36 % (ref 36.0–46.0)
Hemoglobin: 11.4 g/dL — ABNORMAL LOW (ref 12.0–15.0)
MCH: 27.1 pg (ref 26.0–34.0)
MCHC: 31.7 g/dL (ref 30.0–36.0)
MCV: 85.7 fL (ref 78.0–100.0)
Platelets: 321 10*3/uL (ref 150–400)
RBC: 4.2 MIL/uL (ref 3.87–5.11)
RDW: 14.6 % (ref 11.5–15.5)
WBC: 8 10*3/uL (ref 4.0–10.5)

## 2017-03-03 NOTE — Discharge Summary (Signed)
Physician Discharge Summary  [name] ALP:[id number] DOB: 1931-05-02 DOA: 02/24/2017  PCP: [name], MD  Admit date: 02/24/2017 Discharge date: 03/04/2017  Recommendations for Outpatient Follow-up:  1. Check CBC and BMP per SNF protocl 2. Change foley catheter every 1 month 3. Per GU,  patient chronically clononized so treat UTI if patient is symptomatic only. 4. Maintain regular soft bowel movements.  Discharge Diagnoses:  Principal Problem:   Acute encephalopathy Active Problems:   Hypothyroid   TIA (transient ischemic attack)   Urinary retention   Goals of care, counseling/discussion   Palliative care by specialist    Discharge Condition: stable   Diet recommendation: as tolerated   History of present illness:  83 year old Caucasian female with a past medical history of dementia, likely vascular, stroke, urinary retention with a chronic foley for the past 2 years, frequent UTIs, hypothyroidism who presented from ALF to [facility] with lethargy and worsening confusion. She was found to have UTI.  Hospital Course:   Acute encephalopathy, toxic Secondary to UTI Stable Still altered, oriented to self only  Urinary tract infection Per GU, treat UTI if pt symptomatic only Per GU, no need for abx on discharge unless pt sx-ic  History of urinary retention with chronic indwelling Foley/right nephrolithiasis Foley catheter was replaced in the emergency department 4/9 Foley change in 1 month Follow up with GU per scheduled appt   Advanced dementia, likely vascular Stable   History of stroke Continue Plavix. Continue statin.  History of hypothyroidism Continue levothyroxin  Hypokalemia. Supplemented and WNL   DVT prophylaxis: Lovenox in hospital  Code Status: full code  Family Communication: not at the bedside this am     Signed:  Leisa Lenz, MD  [facility] 03/03/2017, 10:15 AM  Pager #: [phone]  Time spent in minutes: less than  30 minutes   Discharge Exam: Vitals:   03/02/17 2238 03/03/17 0619  BP: (!) 154/96 (!) 135/55  Pulse: 82 77  Resp: 16 18  Temp: 97.2 F (36.2 C) 97.9 F (36.6 C)   Vitals:   03/02/17 0552 03/02/17 1421 03/02/17 2238 03/03/17 0619  BP: (!) 138/57 (!) 149/86 (!) 154/96 (!) 135/55  Pulse: 70 81 82 77  Resp: 18 18 16 18   Temp: 98.7 F (37.1 C) 98.2 F (36.8 C) 97.2 F (36.2 C) 97.9 F (36.6 C)  TempSrc: Axillary Oral Axillary Axillary  SpO2: 94% 99% 96% 94%  Weight:      Height:        General: Pt is not in acute distress Cardiovascular: Regular rate and rhythm, S1/S2 + Respiratory: Clear to auscultation bilaterally, no wheezing Abdominal: Soft, non tender, non distended, bowel sounds +, no guarding Extremities: no edema, no cyanosis, pulses palpable bilaterally DP and PT Neuro: Nonfocal  Discharge Instructions  Discharge Instructions    Call MD for:  persistant nausea and vomiting    Complete by:  As directed    Call MD for:  redness, tenderness, or signs of infection (pain, swelling, redness, odor or green/yellow discharge around incision site)    Complete by:  As directed    Call MD for:  severe uncontrolled pain    Complete by:  As directed    Diet - low sodium heart healthy    Complete by:  As directed    Discharge instructions    Complete by:  As directed    Per urology continue foley exchange every 1 month.   Increase activity slowly    Complete by:  As directed      Allergies as of 03/03/2017      Reactions   Ciprofloxacin Rash   Citalopram Hydrobromide Other (See Comments)   Shaky inside   Sulfa Antibiotics Rash      Medication List    TAKE these medications   acetaminophen 325 MG tablet Commonly known as:  TYLENOL Take 2 tablets (650 mg total) by mouth every 6 (six) hours as needed for mild pain (or Fever >/= 101).   Acidophilus/Goat Milk Caps Take 1 capsule by mouth daily.   atorvastatin 20 MG tablet Commonly known as:  LIPITOR Take 1  tablet (20 mg total) by mouth daily at 6 PM.   clopidogrel 75 MG tablet Commonly known as:  PLAVIX Take 75 mg by mouth daily.   DESITIN 13 % Crea Generic drug:  Zinc Oxide Apply 1 application topically 3 (three) times daily.   feeding supplement (ENSURE ENLIVE) Liqd Take 237 mLs by mouth 2 (two) times daily between meals.   levothyroxine 150 MCG tablet Commonly known as:  SYNTHROID, LEVOTHROID Take 150 mcg by mouth daily before breakfast.   miconazole 2 % cream Commonly known as:  MICOTIN Apply 1 application topically 2 (two) times daily.   multivitamin tablet Take 1 tablet by mouth daily.   sertraline 50 MG tablet Commonly known as:  ZOLOFT Take 50 mg by mouth daily.       Contact information for follow-up providers    [name], MD Follow up.   Specialty:  Family Medicine Contact information: [place] [address] [phone]            Contact information for after-discharge care    Destination    [facility] .   Specialty:  Skilled Nursing Facility Contact information: [place] [phone]                   The results of significant diagnostics from this hospitalization (including imaging, microbiology, ancillary and laboratory) are listed below for reference.    Significant Diagnostic Studies: Dg Chest 2 View  Result Date: 02/03/2017 CLINICAL DATA:  Followup pulmonary infiltrate EXAM: CHEST  2 VIEW COMPARISON:  02/02/2017.  05/17/2016. FINDINGS: Heart size is normal. There is aortic atherosclerosis. There chronic interstitial lung markings but no sign of active infiltrate, mass, effusion or collapse. Chronic degenerative changes affect the spine. IMPRESSION: No active disease. Chronic interstitial lung markings. Aortic atherosclerosis. Area questioned in the left lower lobe yesterday looks clear today. Electronically Signed   By: Nelson Chimes M.D.   On: 02/03/2017  12:55   Ct Head Wo Contrast  Result Date: 02/24/2017 CLINICAL DATA:  Altered mental status, unresponsive EXAM: CT HEAD WITHOUT CONTRAST TECHNIQUE: Contiguous axial images were obtained from the base of the skull through the vertex without intravenous contrast. COMPARISON:  MRI brain dated 02/03/2017 FINDINGS: Brain: No evidence of acute infarction, hemorrhage, hydrocephalus, extra-axial collection or mass lesion/mass effect. Subcortical white matter and periventricular small vessel ischemic changes. Mild global cortical atrophy. Vascular: Intracranial atherosclerosis. Skull: Normal. Negative for fracture or focal lesion. Sinuses/Orbits: The visualized paranasal sinuses are essentially clear. The mastoid air cells are unopacified. Other: None. IMPRESSION: No evidence of acute intracranial abnormality. Atrophy with small vessel ischemic changes. Electronically Signed   By: Julian Hy M.D.   On: 02/24/2017 10:54   Ct Head Wo Contrast  Result Date: 02/02/2017 CLINICAL DATA:  84 year old female with history of altered mental status. Dementia. EXAM:  CT HEAD WITHOUT CONTRAST TECHNIQUE: Contiguous axial images were obtained from the base of the skull through the vertex without intravenous contrast. COMPARISON:  Head CT 05/17/2016.  Brain MRI 05/17/2016. FINDINGS: Comment:  Study is slightly limited by considerable patient motion. Brain: Patchy and confluent areas of decreased attenuation are noted throughout the deep and periventricular white matter of the cerebral hemispheres bilaterally, compatible with severe chronic microvascular ischemic disease. No evidence of acute infarction, hemorrhage, hydrocephalus, extra-axial collection or mass lesion/mass effect. Vascular: No hyperdense vessel or unexpected calcification. Skull: Normal. Negative for fracture or focal lesion. Sinuses/Orbits: No acute finding. Other: None. IMPRESSION: 1. No acute intracranial abnormalities on today's motion limited examination. 2.  Extensive chronic microvascular ischemic changes in the cerebral white matter, as above. Electronically Signed   By: Vinnie Langton M.D.   On: 02/02/2017 18:35   Ct Chest Wo Contrast  Result Date: 02/04/2017 CLINICAL DATA:  Shortness of Breath EXAM: CT CHEST WITHOUT CONTRAST TECHNIQUE: Multidetector CT imaging of the chest was performed following the standard protocol without IV contrast. COMPARISON:  Chest x-ray 02/03/2017 FINDINGS: Cardiovascular: Heart is normal size. Dense diffuse coronary artery calcifications. Moderate aortic calcifications. No evidence of aneurysm. Mediastinum/Nodes: No mediastinal, hilar, or axillary adenopathy. Trachea and esophagus unremarkable. Lungs/Pleura: Dependent opacities in the lower lungs bilaterally, likely dependent atelectasis. No visible effusions. Upper Abdomen: Prior cholecystectomy. Nonobstructing stone in the upper pole of the right kidney is partially imaged. Aortic calcifications. Musculoskeletal: Chest wall soft tissues are unremarkable. No acute bony abnormality. Degenerative changes throughout the thoracic spine. IMPRESSION: Dependent atelectasis in the lower lobes bilaterally. Advanced coronary artery disease, aortic atherosclerosis. No acute cardiopulmonary disease. Prior cholecystectomy. Electronically Signed   By: Rolm Baptise M.D.   On: 02/04/2017 08:52   Mr Brain Wo Contrast  Result Date: 02/03/2017 CLINICAL DATA:  Confusion and slurred speech EXAM: MRI HEAD WITHOUT CONTRAST TECHNIQUE: Multiplanar, multiecho pulse sequences of the brain and surrounding structures were obtained without intravenous contrast. COMPARISON:  Head CT 02/02/2017 Brain MRI 05/17/2016 FINDINGS: Brain: No focal diffusion restriction to indicate acute infarct. No intraparenchymal hemorrhage. There is diffuse confluent hyperintense T2-weighted signal within the periventricular and deep white matter, most often seen in the setting of chronic microvascular ischemia. No mass lesion or  midline shift. No hydrocephalus or extra-axial fluid collection. The midline structures are normal. No age advanced or lobar predominant atrophy. Vascular: Major intracranial arterial and venous sinus flow voids are preserved. No evidence of chronic microhemorrhage or amyloid angiopathy. Skull and upper cervical spine: The visualized skull base, calvarium, upper cervical spine and extracranial soft tissues are normal. Sinuses/Orbits: No fluid levels or advanced mucosal thickening. No mastoid effusion. Normal orbits. IMPRESSION: Severe chronic microvascular ischemia without acute intracranial abnormality. Electronically Signed   By: Ulyses Jarred M.D.   On: 02/03/2017 04:06   US Renal  Result Date: 02/25/2017 CLINICAL DATA:  Frequent UTI EXAM: RENAL / URINARY TRACT ULTRASOUND COMPLETE COMPARISON:  CT scan 12/ 31/14 FINDINGS: Right Kidney: Length: 10.5 cm. Normal echogenicity. There is a elongated probable shadowing calcification in upper pole of the right kidney measures at least 1.6 cm. Nonobstructive calculus cannot be excluded. Left Kidney: Length: 10 cm. Echogenicity within normal limits. No mass or hydronephrosis visualized. Bladder: Foley catheter within decompressed urinary bladder. IMPRESSION: 1. Probable nonobstructive nephrolithiasis with a elongated calcification in upper pole of the right kidney measures at least 1.6 cm. No hydronephrosis. 2. Foley catheter within decompressed urinary bladder. Electronically Signed   By: Orlean Bradford.D.  On: 02/25/2017 14:37   Dg Pelvis Portable  Result Date: 02/25/2017 CLINICAL DATA:  Hip pain. EXAM: PORTABLE PELVIS 1-2 VIEWS COMPARISON:  09/24/2014 FINDINGS: A left total hip arthroplasty is noted. A screw penetrates the acetabulum, unchanged. Moderate osteopenia is noted. Left hip appears located on this single view. Remote right superior and inferior pubic rami fractures are noted. Advanced degenerative changes in the right hip have progressed. No acute  abnormality is present. IMPRESSION: 1. Progressive advanced degenerative changes in the right hip. 2. Remote healed fracture of the right superior and inferior pubic ramus. 3. Left total hip arthroplasty is stable. Electronically Signed   By: San Morelle M.D.   On: 02/25/2017 12:23   Dg Chest Port 1 View  Result Date: 02/24/2017 CLINICAL DATA:  Patient is unresponsive. EXAM: PORTABLE CHEST 1 VIEW COMPARISON:  February 03, 2017 FINDINGS: The heart, hila, mediastinum, lungs, and pleura are unremarkable. IMPRESSION: No active disease. Electronically Signed   By: Dorise Bullion III M.D   On: 02/24/2017 14:11   Dg Chest Port 1 View  Result Date: 02/02/2017 CLINICAL DATA:  Acute mental status change. EXAM: PORTABLE CHEST 1 VIEW COMPARISON:  May 17, 2016 FINDINGS: No pneumothorax. The cardiomediastinal silhouette is stable with a tortuous thoracic aorta. Mild increased interstitial markings. Mild hazy opacity in left base. IMPRESSION: Suggested mild pulmonary venous congestion. Subtle but more focal opacity in the left base could represent than early infiltrate. Recommend follow-up to resolution. Electronically Signed   By: Dorise Bullion III M.D   On: 02/02/2017 17:58    Microbiology: Recent Results (from the past 240 hour(s))  Culture, blood (routine x 2)     Status: None   Collection Time: 02/24/17 10:44 AM  Result Value Ref Range Status   Specimen Description BLOOD LEFT ANTECUBITAL  Final   Special Requests IN PEDIATRIC BOTTLE Blood Culture adequate volume  Final   Culture   Final    NO GROWTH 5 DAYS Performed at [facility], [address], [place], [place] [address]    Report Status 03/01/2017 FINAL  Final  Culture, blood (routine x 2)     Status: None   Collection Time: 02/24/17 11:32 AM  Result Value Ref Range Status   Specimen Description BLOOD LEFT ANTECUBITAL  Final   Special Requests   Final    BOTTLES DRAWN AEROBIC AND ANAEROBIC Blood Culture adequate volume    Culture   Final    NO GROWTH 5 DAYS Performed at [facility], [place] [address], [place], [place] [address]    Report Status 03/01/2017 FINAL  Final  Urine culture     Status: Abnormal   Collection Time: 02/24/17 11:32 AM  Result Value Ref Range Status   Specimen Description URINE, RANDOM  Final   Special Requests NONE  Final   Culture MULTIPLE SPECIES PRESENT, SUGGEST RECOLLECTION (A)  Final   Report Status 02/26/2017 FINAL  Final     Labs: Basic Metabolic Panel:  Recent Labs Lab 02/24/17 1024 02/25/17 0624 02/26/17 0616 02/27/17 0640 03/03/17 0636  NA 141 140 142 140 140  K 3.9 3.7 3.2* 3.6 4.2  CL 108 110 112* 110 105  CO2 24 24 22 23 27   GLUCOSE 122* 98 87 107* 113*  BUN 17 11 6  5* 18  CREATININE 0.61 0.47 0.43* 0.39* 0.51  CALCIUM 9.0 8.4* 8.1* 8.5* 8.8*   Liver Function Tests:  Recent Labs Lab 02/24/17 1024  AST 19  ALT 13*  ALKPHOS  92  BILITOT 0.9  PROT 6.9  ALBUMIN 3.5   No results for input(s): LIPASE, AMYLASE in the last 168 hours. No results for input(s): AMMONIA in the last 168 hours. CBC:  Recent Labs Lab 02/24/17 1020 02/25/17 0624 02/26/17 0616 03/03/17 0636  WBC 11.6* 7.9 6.9 8.0  NEUTROABS 8.7*  --   --   --   HGB 12.4 11.2* 10.1* 11.4*  HCT 38.1 35.0* 31.7* 36.0  MCV 87.4 88.6 86.1 85.7  PLT 306 272 264 321   Cardiac Enzymes: No results for input(s): CKTOTAL, CKMB, CKMBINDEX, TROPONINI in the last 168 hours. BNP: BNP (last 3 results)  Recent Labs  02/02/17 2350  BNP 87.3    ProBNP (last 3 results) No results for input(s): PROBNP in the last 8760 hours.  CBG: No results for input(s): GLUCAP in the last 168 hours.

## 2017-03-04 NOTE — Progress Notes (Signed)
1631/Daughter Kim made aware that patient has been discharged to home or to [facility]. We not do not have approval for [facility] from [facility] at this time/patient has to be discharged or the appeal process can be reapplied for.  IM notice reviewed with daughter.  They wish to appeal the stay.

## 2017-03-04 NOTE — Progress Notes (Signed)
CSW spoke with SNF, they are requesting updated PT/OT notes [employer] CSW informed Physician.  Kathrin Greathouse, Latanya Presser, MSW Clinical Social Worker 5E and Psychiatric Service Line [phone] 03/04/2017  12:22 PM

## 2017-03-04 NOTE — Progress Notes (Signed)
Received notification from [place] office that an expedited appela of discharge has been received.Case # [id number]. Care Management department will get records sent before the 11am deadline 4/17. [facility] has up to 72h to make a determination.

## 2017-03-04 NOTE — Progress Notes (Signed)
CSW spoke facility admission and faxed updated PT clinical information. She reports the [facility] was reviewing information. CSW requested insurance contact information. CSW has left voice for a return call. CSW updated patient daughter about process. CSW received call from insurance company and reports patient information went to [employer].  5:09-Insurance reports patient has been denied and offered medical peer to peer and provided contact number to make appointment. [phone] ext. [phone] CSW updated patient daughter [name]. Daughter expressed frustrations. Plan: to discuss discharge plan in the a.m.  CSW will continue to assist with patient discharge.    Kathrin Greathouse, Latanya Presser, MSW Clinical Social Worker 5E and Psychiatric Service Line [phone] 03/04/2017  5:18 PM

## 2017-03-04 NOTE — Progress Notes (Signed)
Physical Therapy Treatment Patient Details Name: [name] MRN: [id number] DOB: 1931/03/23 Today's Date: 03/04/2017    History of Present Illness 83 y.o. female  with past medical history of dementia, Parkinson's, stroke, urinary retention with chronic foley, recurrent UTIs, and hypothyroidism. She presented from ALF with lethargy and confusion.    PT Comments    Pt more alert and inconsistent engaging eye contact and verbal.  Hx dementia.  Pt is total care with transfers and all ADL's and will need to D/C to SNF vs her ALF.  Follow Up Recommendations  SNF     Equipment Recommendations  None recommended by PT    Recommendations for Other Services       Precautions / Restrictions Precautions Precautions: Fall Precaution Comments: Dementia, Parkinson's Restrictions Weight Bearing Restrictions: No    Mobility  Bed Mobility Overal bed mobility: Needs Assistance Bed Mobility: Supine to Sit;Sit to Supine     Supine to sit: Total assist;+2 for physical assistance;+2 for safety/equipment Sit to supine: Total assist;+2 for physical assistance;+2 for safety/equipment   General bed mobility comments: total assist + 2 pt 0%.  Once balanced and upright pt was able to static sit EOB x 12 min.  B feet planted on floor this time.  Still rigid but more able to support self with no posterior lean.  EOB at close supervision.  B shoulders very limited mvt.  Placed bed side table in front to engage active UE activity.  Pt unable to hold a cup.  Pt unable to wipe nose with tissue when verbally and tactile prompted.  Still present with tight hip and knee flexors.    Transfers                    Ambulation/Gait                 Stairs            Wheelchair Mobility    Modified Rankin (Stroke Patients Only)       Balance                                            Cognition Arousal/Alertness: Awake/alert Behavior During Therapy: Flat  affect Overall Cognitive Status: Difficult to assess Area of Impairment: Attention;Following commands                   Current Attention Level: Sustained   Following Commands: Follows one step commands inconsistently       General Comments: Hx dementia, difficult to access baseline      Exercises      General Comments        Pertinent Vitals/Pain Pain Assessment: No/denies pain    Home Living                      Prior Function            PT Goals (current goals can now be found in the care plan section) Progress towards PT goals: Progressing toward goals    Frequency    Min 2X/week      PT Plan Current plan remains appropriate    Co-evaluation             End of Session Equipment Utilized During Treatment: Gait belt Activity Tolerance: Patient tolerated treatment well Patient left: in bed;with bed alarm  set;with call bell/phone within reach Nurse Communication: Mobility status PT Visit Diagnosis: Muscle weakness (generalized) (M62.81);Difficulty in walking, not elsewhere classified (R26.2)     Time: 6945-0388 PT Time Calculation (min) (ACUTE ONLY): 24 min  Charges:  $Therapeutic Activity: 23-37 mins                    G Codes:       Rica Koyanagi  PTA [facility]  Acute  Rehab Pager      [phone]

## 2017-03-04 NOTE — Progress Notes (Signed)
Patient seen and examined, being fed by RN. No complaints and no overnight events reported by nursing. Plan is to discharge to SNF pending bed availability. Per CSW, facility desires repeat PT assessment, so this has been ordered as discharge is imminent. Please see discharge summary for full details.   Vance Gather, MD 03/04/2017 12:20 PM

## 2017-03-04 NOTE — Care Management Important Message (Signed)
Important Message  Patient Details IM Letter given to Rhonda/Case Manager to present to Patient Name: [name] MRN: [id number] Date of Birth: October 04, 1931   Medicare Important Message Given:  Yes    Kerin Salen 03/04/2017, 12:39 PM

## 2017-03-05 DIAGNOSIS — F0150 Vascular dementia without behavioral disturbance: Secondary | ICD-10-CM

## 2017-03-05 NOTE — Progress Notes (Addendum)
Date: March 05, 2017 0850/spoke with dtg. [facility] has denied to stay For SNF, family now would like a palliative care consult and home with palliative care/Dr. Bonner Puna notified via text of this. 0900/DND and IM faxed to [facility]. 0900/Spoke with daughter-Kim/family would like to have patient go to [facility] with palliative care/CSW-Nichole notified/will call [facility] once living place established with family. 0930/tct-Fran at [facility]/program is not allowed in snf setting. Velva Harman, BSN, [place], CCM: [phone]

## 2017-03-05 NOTE — Progress Notes (Signed)
CSW spoke with patient daughter at bedside and discussed discharge plan/ plan of Care. Patient daughter agreeable for patient to go to SNF[facility] and is willing to pay privately. CSW arranged for conference call with [facility]ings liaison at [facility]. Patient daughter asked questions about patient care with palliative vs. Hospice. Patient daughter agreeable with care at facility.  Patient will discharge to [facility] today.  Patient daughter is hopeful to see physician.   Kathrin Greathouse, Latanya Presser, MSW Clinical Social Worker 5E and Psychiatric Service Line [phone] 03/05/2017  10:30 AM

## 2017-03-05 NOTE — Discharge Summary (Addendum)
Physician Discharge Summary  [name]:[id number] DOB: [birth year] DOA: 02/24/2017  PCP: [name], MD  Admit date: 02/24/2017 Discharge date: 03/04/2017  Recommendations for Outpatient Follow-up:  1. Check CBC and BMP per SNF protocol 2. Change foley catheter at follow up with [facility] May 3. Further foley care per urology. 3. Per GU,  patient is chronically clononized so treat pyuria/bacteriuria only if symptomatic. 4. Maintain regular soft bowel movements. 5. Recheck thyroid function tests in 4 - 6 weeks. TSH mildly suppressed during last admission at 0.308. Synthroid dose continued.  6. Address moisture-associated skin dermatitis with vigilant perineal care. Recommend cleansing stage 2 sacral pressure wound and right buttocks wound with soap and water and to apply allevyn silicone foam dressing, change every 3 days and PRN soilage. Barrier cream with every incontinent episode and replacing of foam pad dressing to buttocks daily and whenever soiled.  7. Recommend ongoing speech therapy services for dysarthria and anarthria.  8. Patient should be followed by palliative care services at [facility].   Discharge Diagnoses:  Principal Problem:   Acute encephalopathy Active Problems:   Hypothyroid   TIA (transient ischemic attack)   Urinary retention   Goals of care, counseling/discussion   Palliative care by specialist  Discharge Condition: Stable  Diet recommendation: Dysphagia 2  History of present illness:  [name] is an 83 year old female with a past medical history of dementia, likely vascular, stroke, urinary retention with a chronic foley for the past 2 years, frequent UTIs, and hypothyroidism who presented from ALF to [facility] with lethargy and worsening confusion. She was found to have UTI which was treated with only mild improvement in mental status. Thus, it is felt that her presentation is related to progression of underlying dementia.  Hospital  Course:  Acute encephalopathy, toxic: Only mildly improved after treatment for UTI. Remains oriented to self only.  CA-UTI: s/p ancef, then ceftriaxone x3 days. - Per urology, no need for abx on discharge unless pt sx-ic  History of urinary retention with chronic indwelling Foley/right nephrolithiasis: Foley catheter was replaced in the emergency department 4/9 - Follow up with urology per scheduled appt   Advanced dementia, likely vascular: Stable   History of stroke - Continue plavix. After discussions of risks and benefits with family, will discontinue statin.  History of hypothyroidism - Continue levothyroxin  Hypokalemia. - Supplemented and WNL  Moisture-associated skin dermatitis: Present on arrival.  - Vigilant perineal care. Recommend cleansing with soap and water and to apply allevyn silicone foam dressing, change every 3 days and PRN soilage. Apply barrier cream with every incontinent episode and replacing of foam pad dressing to buttocks daily and whenever soiled.   DVT prophylaxis: Lovenox in hospital  Code Status: full code  Family Communication: Daughter (Bolan) at bedside.  Discharge Exam: BP (!) 148/59 (BP Location: Left Arm)   Pulse 70   Temp 97.9 F (36.6 C) (Axillary)   Resp 18   Ht 5\' 2"  (1.575 m)   Wt 60.3 kg (133 lb)   SpO2 97%   BMI 24.33 kg/m   General: Pt is not in acute distress Cardiovascular: Regular rate and rhythm, S1/S2 + Respiratory: Clear to auscultation bilaterally, no wheezing Abdominal: Soft, non tender, non distended, bowel sounds +, no guarding Extremities: no edema, no cyanosis, pulses palpable bilaterally DP and PT Neuro: Alert, disoriented, very sparse speech. Otherwise nonfocal Skin: Stage 2 sacral pressure wound  Discharge Instructions  Discharge Instructions    Call MD for:  persistant nausea and vomiting    Complete by:  As directed    Call MD for:  redness, tenderness, or signs of infection (pain, swelling,  redness, odor or green/yellow discharge around incision site)    Complete by:  As directed    Call MD for:  severe uncontrolled pain    Complete by:  As directed    Diet - low sodium heart healthy    Complete by:  As directed    Discharge instructions    Complete by:  As directed    Per urology continue foley exchange every 1 month.   Increase activity slowly    Complete by:  As directed      Allergies as of 03/05/2017      Reactions   Ciprofloxacin Rash   Citalopram Hydrobromide Other (See Comments)   Shaky inside   Sulfa Antibiotics Rash      Medication List    STOP taking these medications   atorvastatin 20 MG tablet Commonly known as:  LIPITOR     TAKE these medications   acetaminophen 325 MG tablet Commonly known as:  TYLENOL Take 2 tablets (650 mg total) by mouth every 6 (six) hours as needed for mild pain (or Fever >/= 101).   Acidophilus/Goat Milk Caps Take 1 capsule by mouth daily.   clopidogrel 75 MG tablet Commonly known as:  PLAVIX Take 75 mg by mouth daily.   DESITIN 13 % Crea Generic drug:  Zinc Oxide Apply 1 application topically 3 (three) times daily.   feeding supplement (ENSURE ENLIVE) Liqd Take 237 mLs by mouth 2 (two) times daily between meals.   levothyroxine 150 MCG tablet Commonly known as:  SYNTHROID, LEVOTHROID Take 150 mcg by mouth daily before breakfast.   miconazole 2 % cream Commonly known as:  MICOTIN Apply 1 application topically 2 (two) times daily.   multivitamin tablet Take 1 tablet by mouth daily.   sertraline 50 MG tablet Commonly known as:  ZOLOFT Take 50 mg by mouth daily.       Contact information for follow-up providers    [name], MD Follow up.   Specialty:  Family Medicine Contact information: [place] [address] [phone]        [facility] Follow up on 03/21/2017.   Contact information: [place] [phone]           Contact information for after-discharge care    Destination    [facility] Follow up.   Specialty:  [place] information: [address] [place] [phone]                   The results of significant diagnostics from this hospitalization (including imaging, microbiology, ancillary and laboratory) are listed below for reference.    Significant Diagnostic Studies: Ct Head Wo Contrast  Result Date: 02/24/2017 CLINICAL DATA:  Altered mental status, unresponsive EXAM: CT HEAD WITHOUT CONTRAST TECHNIQUE: Contiguous axial images were obtained from the base of the skull through the vertex without intravenous contrast. COMPARISON:  MRI brain dated 02/03/2017 FINDINGS: Brain: No evidence of acute infarction, hemorrhage, hydrocephalus, extra-axial collection or mass lesion/mass effect. Subcortical white matter and periventricular small vessel ischemic changes. Mild global cortical atrophy. Vascular: Intracranial atherosclerosis. Skull: Normal. Negative for fracture or focal lesion. Sinuses/Orbits: The visualized paranasal sinuses are essentially clear. The mastoid air cells are unopacified. Other: None. IMPRESSION: No evidence  of acute intracranial abnormality. Atrophy with small vessel ischemic changes. Electronically Signed   By: Julian Hy M.D.   On: 02/24/2017 10:54   Ct Chest Wo Contrast  Result Date: 02/04/2017 CLINICAL DATA:  Shortness of Breath EXAM: CT CHEST WITHOUT CONTRAST TECHNIQUE: Multidetector CT imaging of the chest was performed following the standard protocol without IV contrast. COMPARISON:  Chest x-ray 02/03/2017 FINDINGS: Cardiovascular: Heart is normal size. Dense diffuse coronary artery calcifications. Moderate aortic calcifications. No evidence of aneurysm. Mediastinum/Nodes: No mediastinal, hilar, or axillary adenopathy. Trachea and esophagus unremarkable. Lungs/Pleura:  Dependent opacities in the lower lungs bilaterally, likely dependent atelectasis. No visible effusions. Upper Abdomen: Prior cholecystectomy. Nonobstructing stone in the upper pole of the right kidney is partially imaged. Aortic calcifications. Musculoskeletal: Chest wall soft tissues are unremarkable. No acute bony abnormality. Degenerative changes throughout the thoracic spine. IMPRESSION: Dependent atelectasis in the lower lobes bilaterally. Advanced coronary artery disease, aortic atherosclerosis. No acute cardiopulmonary disease. Prior cholecystectomy. Electronically Signed   By: Rolm Baptise M.D.   On: 02/04/2017 08:52   US Renal  Result Date: 02/25/2017 CLINICAL DATA:  Frequent UTI EXAM: RENAL / URINARY TRACT ULTRASOUND COMPLETE COMPARISON:  CT scan 12/ 31/14 FINDINGS: Right Kidney: Length: 10.5 cm. Normal echogenicity. There is a elongated probable shadowing calcification in upper pole of the right kidney measures at least 1.6 cm. Nonobstructive calculus cannot be excluded. Left Kidney: Length: 10 cm. Echogenicity within normal limits. No mass or hydronephrosis visualized. Bladder: Foley catheter within decompressed urinary bladder. IMPRESSION: 1. Probable nonobstructive nephrolithiasis with a elongated calcification in upper pole of the right kidney measures at least 1.6 cm. No hydronephrosis. 2. Foley catheter within decompressed urinary bladder. Electronically Signed   By: Lahoma Crocker M.D.   On: 02/25/2017 14:37   Dg Pelvis Portable  Result Date: 02/25/2017 CLINICAL DATA:  Hip pain. EXAM: PORTABLE PELVIS 1-2 VIEWS COMPARISON:  09/24/2014 FINDINGS: A left total hip arthroplasty is noted. A screw penetrates the acetabulum, unchanged. Moderate osteopenia is noted. Left hip appears located on this single view. Remote right superior and inferior pubic rami fractures are noted. Advanced degenerative changes in the right hip have progressed. No acute abnormality is present. IMPRESSION: 1. Progressive  advanced degenerative changes in the right hip. 2. Remote healed fracture of the right superior and inferior pubic ramus. 3. Left total hip arthroplasty is stable. Electronically Signed   By: San Morelle M.D.   On: 02/25/2017 12:23   Dg Chest Port 1 View  Result Date: 02/24/2017 CLINICAL DATA:  Patient is unresponsive. EXAM: PORTABLE CHEST 1 VIEW COMPARISON:  February 03, 2017 FINDINGS: The heart, hila, mediastinum, lungs, and pleura are unremarkable. IMPRESSION: No active disease. Electronically Signed   By: Dorise Bullion III M.D   On: 02/24/2017 14:11    Microbiology: Recent Results (from the past 240 hour(s))  Culture, blood (routine x 2)     Status: None   Collection Time: 02/24/17 10:44 AM  Result Value Ref Range Status   Specimen Description BLOOD LEFT ANTECUBITAL  Final   Special Requests IN PEDIATRIC BOTTLE Blood Culture adequate volume  Final   Culture   Final    NO GROWTH 5 DAYS Performed at [facility], [address], [place], [place] [address]    Report Status 03/01/2017 FINAL  Final  Culture, blood (routine x 2)     Status: None   Collection Time: 02/24/17 11:32 AM  Result Value Ref Range Status   Specimen Description BLOOD LEFT ANTECUBITAL  Final   Special Requests   Final    BOTTLES DRAWN AEROBIC AND ANAEROBIC Blood Culture adequate volume   Culture   Final    NO GROWTH 5 DAYS Performed at [facility], [address], [place],  [address]    Report Status 03/01/2017 FINAL  Final  Urine culture     Status: Abnormal   Collection Time: 02/24/17 11:32 AM  Result Value Ref Range Status   Specimen Description URINE, RANDOM  Final   Special Requests NONE  Final   Culture MULTIPLE SPECIES PRESENT, SUGGEST RECOLLECTION (A)  Final   Report Status 02/26/2017 FINAL  Final     Labs: Basic Metabolic Panel:  Recent Labs Lab 02/27/17 0640 03/03/17 0636  NA 140 140  K 3.6 4.2  CL 110 105  CO2 23 27  GLUCOSE 107* 113*  BUN 5* 18  CREATININE  0.39* 0.51  CALCIUM 8.5* 8.8*   CBC:  Recent Labs Lab 03/03/17 0636  WBC 8.0  HGB 11.4*  HCT 36.0  MCV 85.7  PLT 321    Vance Gather, MD  [facility] 03/05/2017, 11:46 AM  Time spent in minutes: 50 minutes

## 2017-03-05 NOTE — Progress Notes (Signed)
Report called to Verneda Skill at [facility].

## 2017-03-05 NOTE — Progress Notes (Signed)
[facility] called for transport.  RN given number for report [id number].  [phone]  Kathrin Greathouse, Latanya Presser, MSW Clinical Social Worker 5E and Psychiatric Service Line [phone] 03/05/2017  1:34 PM

## 2017-03-05 NOTE — Clinical Social Work Placement (Signed)
   CLINICAL SOCIAL WORK PLACEMENT  NOTE  Date:  03/05/2017  Patient Details  Name: [name] MRN: [id number] Date of Birth: Sep 24, 1931  Clinical Social Work is seeking post-discharge placement for this patient at the [place] level of care (*CSW will initial, date and re-position this form in  chart as items are completed):  Yes   Patient/family provided with [place] Work Department's list of facilities offering this level of care within the geographic area requested by the patient (or if unable, by the patient's family).  Yes   Patient/family informed of their freedom to choose among providers that offer the needed level of care, that participate in Medicare, Medicaid or managed care program needed by the patient, have an available bed and are willing to accept the patient.  Yes   Patient/family informed of [facility]'s ownership interest in [facility] and [facility], as well as of the fact that they are under no obligation to receive care at these facilities.  PASRR submitted to EDS on       PASRR number received on       Existing PASRR number confirmed on 02/28/17     FL2 transmitted to all facilities in geographic area requested by pt/family on       FL2 transmitted to all facilities within larger geographic area on       Patient informed that his/her managed care company has contracts with or will negotiate with certain facilities, including the following:  [facility]     Yes   Patient/family informed of bed offers received.  Patient chooses bed at [facility]     Physician recommends and patient chooses bed at      Patient to be transferred to [facility] on 03/05/17.  Patient to be transferred to facility by [facility]     Patient family notified on 03/05/17 of transfer.  Name of family member notified:  [name]     PHYSICIAN       Additional Comment:     _______________________________________________ Lia Hopping, [place] 03/05/2017, 11:19 AM

## 2017-03-07 DIAGNOSIS — E039 Hypothyroidism, unspecified: Secondary | ICD-10-CM | POA: Diagnosis not present

## 2017-03-07 DIAGNOSIS — F325 Major depressive disorder, single episode, in full remission: Secondary | ICD-10-CM | POA: Diagnosis not present

## 2017-03-07 DIAGNOSIS — Z960 Presence of urogenital implants: Secondary | ICD-10-CM | POA: Diagnosis not present

## 2017-03-11 DIAGNOSIS — R531 Weakness: Secondary | ICD-10-CM | POA: Diagnosis not present

## 2017-03-12 DIAGNOSIS — D649 Anemia, unspecified: Secondary | ICD-10-CM | POA: Diagnosis not present

## 2017-03-12 DIAGNOSIS — G9340 Encephalopathy, unspecified: Secondary | ICD-10-CM | POA: Diagnosis not present

## 2017-03-12 DIAGNOSIS — F0150 Vascular dementia without behavioral disturbance: Secondary | ICD-10-CM | POA: Diagnosis not present

## 2017-03-12 LAB — BASIC METABOLIC PANEL WITH GFR
BUN: 12 mg/dL (ref 4–21)
Creatinine: 0.3 mg/dL — AB (ref 0.5–1.1)
Glucose: 106 mg/dL
Potassium: 4.1 mmol/L (ref 3.4–5.3)
Sodium: 142 mmol/L (ref 137–147)

## 2017-03-12 LAB — CBC AND DIFFERENTIAL
HCT: 37 % (ref 36–46)
Hemoglobin: 12.1 g/dL (ref 12.0–16.0)
Neutrophils Absolute: 5 /µL
Platelets: 355 10*3/uL (ref 150–399)
WBC: 8.4 10*3/mL

## 2017-03-12 LAB — HEPATIC FUNCTION PANEL
ALK PHOS: 82 U/L (ref 25–125)
ALT: 9 U/L (ref 7–35)
AST: 15 U/L (ref 13–35)
Bilirubin, Total: 0.3 mg/dL

## 2017-03-14 DIAGNOSIS — R531 Weakness: Secondary | ICD-10-CM | POA: Diagnosis not present

## 2017-03-15 DIAGNOSIS — M6250 Muscle wasting and atrophy, not elsewhere classified, unspecified site: Secondary | ICD-10-CM | POA: Diagnosis not present

## 2017-03-15 DIAGNOSIS — F0390 Unspecified dementia without behavioral disturbance: Secondary | ICD-10-CM | POA: Diagnosis not present

## 2017-03-19 ENCOUNTER — Encounter: Payer: Self-pay | Admitting: Family Medicine

## 2017-05-19 DEATH — deceased

## 2019-03-07 IMAGING — MR MR HEAD W/O CM
8 of 11 series · 36 of 48 positions shown · non-contrast
Comparison: Head CT 02/02/2017

Brain MRI 05/17/2016

CLINICAL DATA: Confusion and slurred speech

EXAM:
MRI HEAD WITHOUT CONTRAST
TECHNIQUE: Multiplanar, multiecho pulse sequences of the brain and surrounding
structures were obtained without intravenous contrast.

[Series 4: DWI · axial · 3.0mm · 1.09mm/px · z∈[-80,+58]mm · 8 of 96 slices shown (1 of 4)]
[im 1/96]
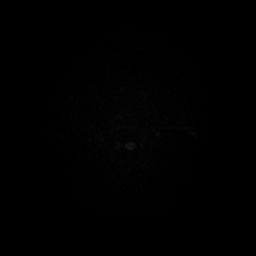
[im 11/96]
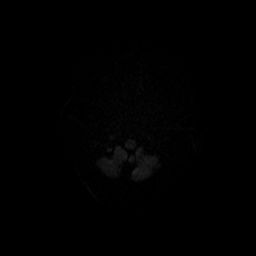
[im 32/96]
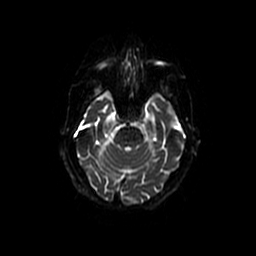
[im 43/96]
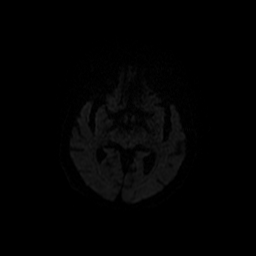
[im 53/96]
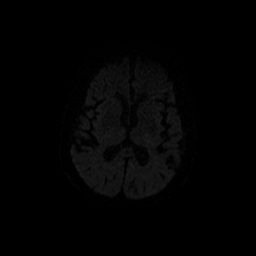
[im 64/96]
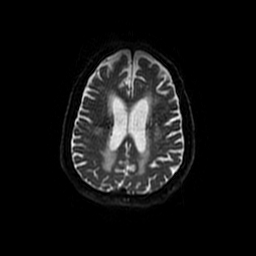
[im 85/96]
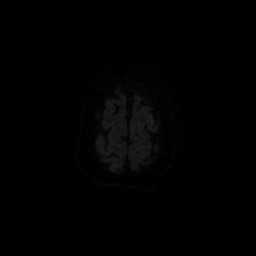
[im 96/96]
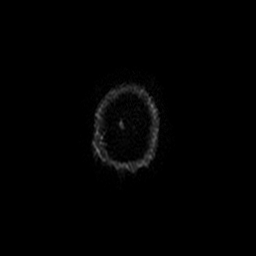

[Series 6: T2 · axial · 5.0mm · 0.43mm/px · z∈[-72,+64]mm · 3 of 24 slices shown (1 of 2)]
[im 1/24]
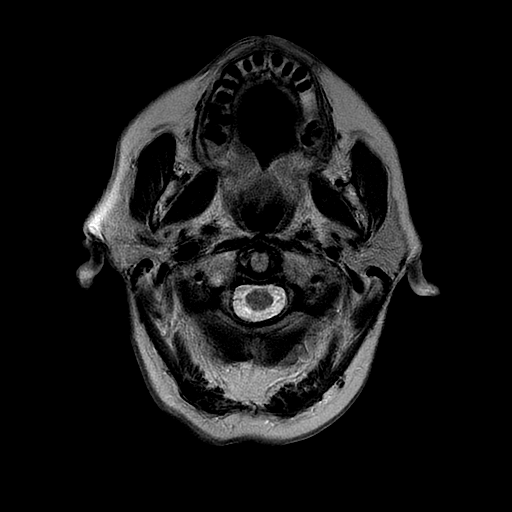
[im 12/24]
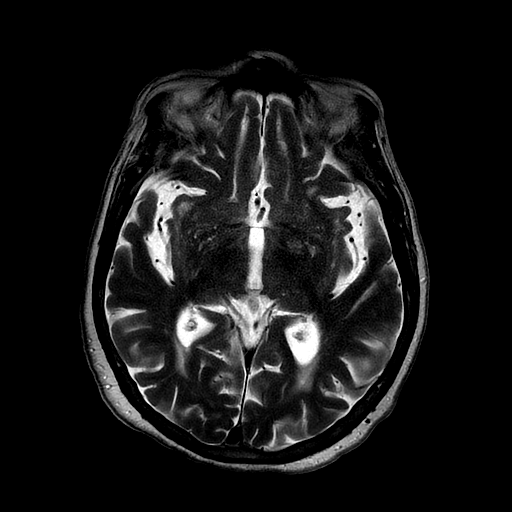
[im 24/24]
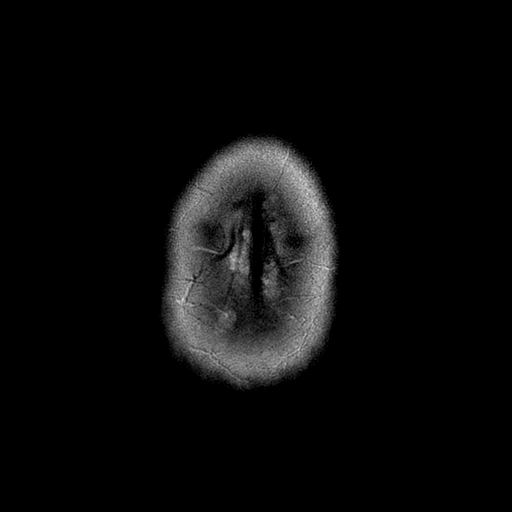

[Series 7: FLAIR · axial · 5.0mm · 0.43mm/px · z∈[-72,+64]mm · 3 of 24 slices shown (1 of 2)]
[im 1/24]
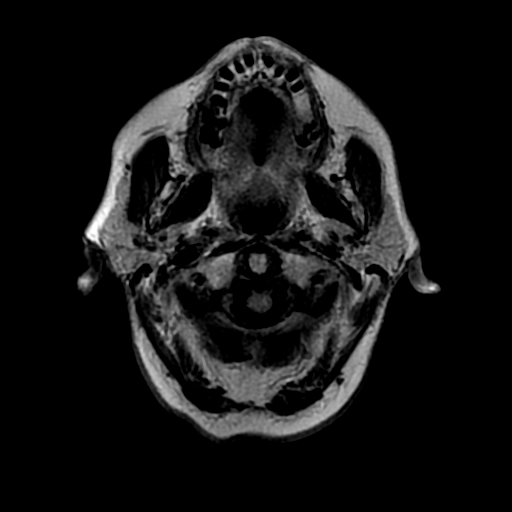
[im 12/24]
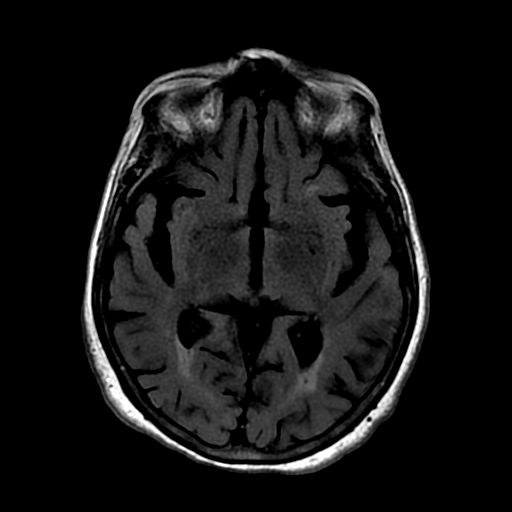
[im 24/24]
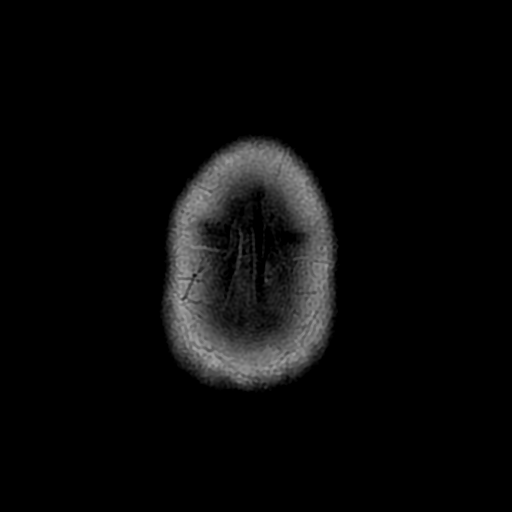

[Series 8: DWI · coronal · 5.0mm · 1.09mm/px · 8 of 68 slices shown (2 of 4)]
[im 1/68]
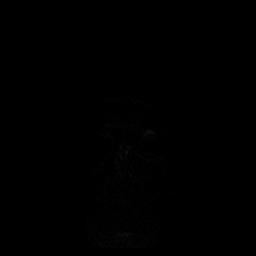
[im 10/68]
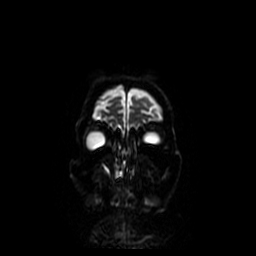
[im 20/68]
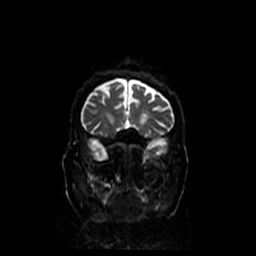
[im 29/68]
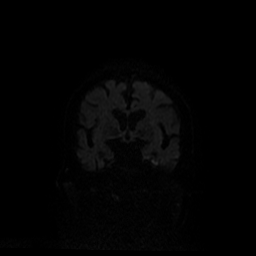
[im 39/68]
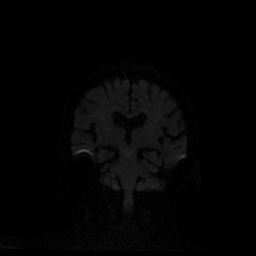
[im 48/68]
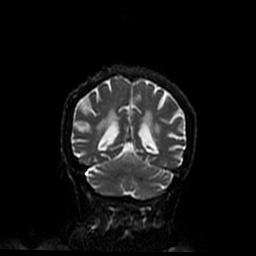
[im 58/68]
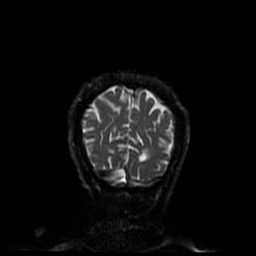
[im 68/68]
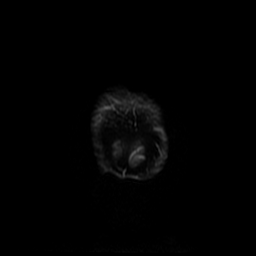

[Series 10: T2 · coronal · 5.0mm · 0.43mm/px · 2 of 29 slices shown (2 of 2)]
[im 1/29]
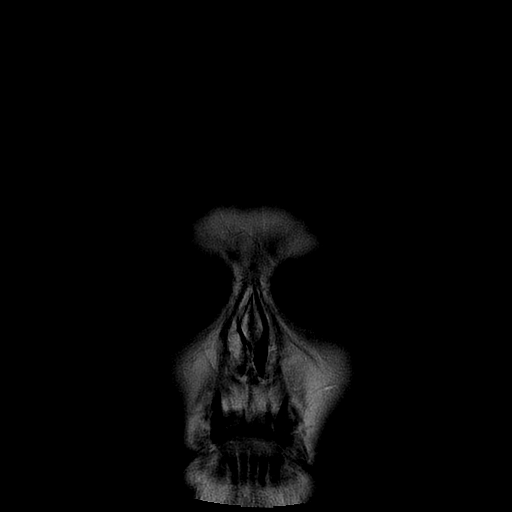
[im 15/29]
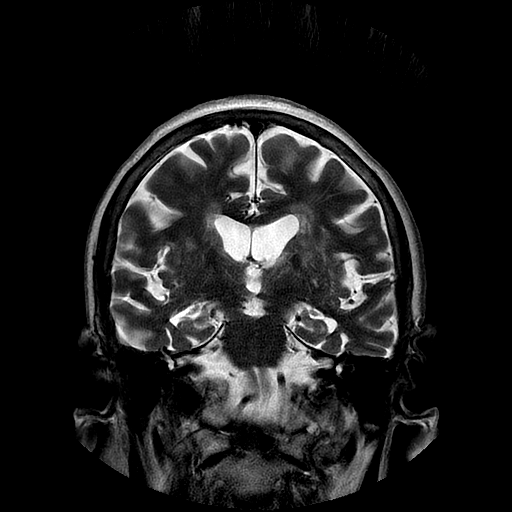

[Series 11: FLAIR · axial · 5.0mm · 0.43mm/px · z∈[-72,+64]mm · 3 of 24 slices shown (2 of 2)]
[im 1/24]
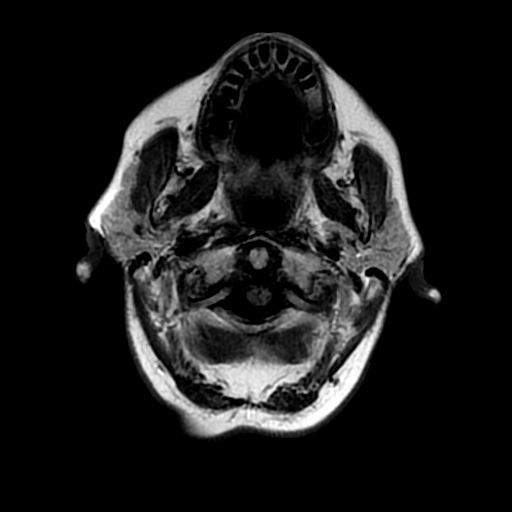
[im 12/24]
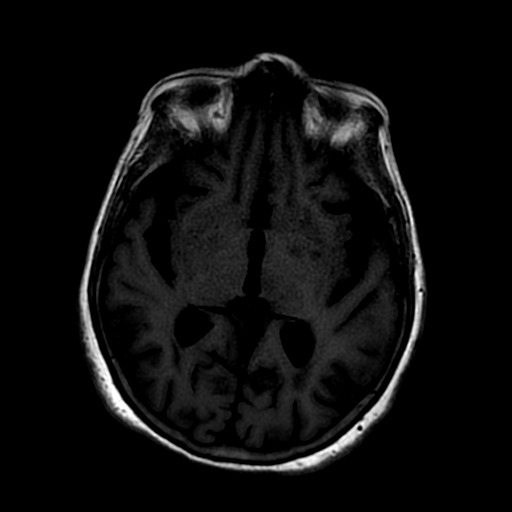
[im 24/24]
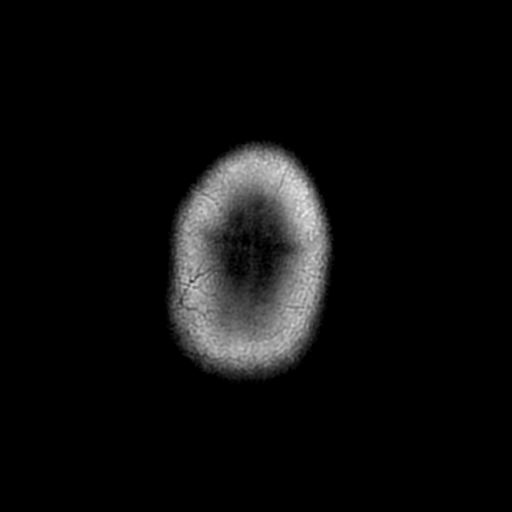

[Series 400: DWI · axial · 3.0mm · 1.09mm/px · z∈[-80,+58]mm · 5 of 48 slices shown (3 of 4)]
[im 1/48]
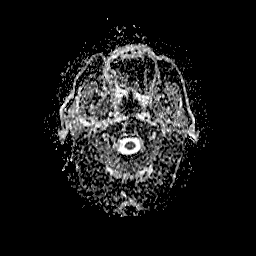
[im 12/48]
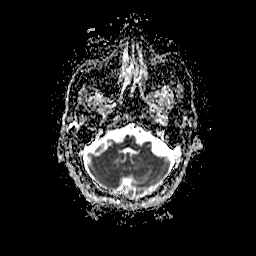
[im 24/48]
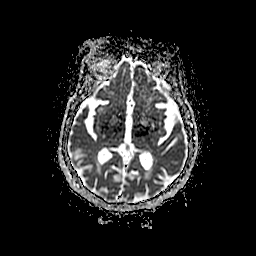
[im 36/48]
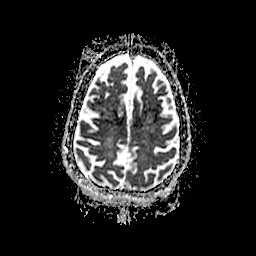
[im 48/48]
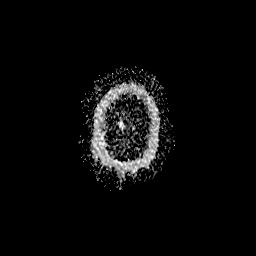

[Series 800: DWI · coronal · 5.0mm · 1.09mm/px · 4 of 33 slices shown (4 of 4)]
[im 1/33]
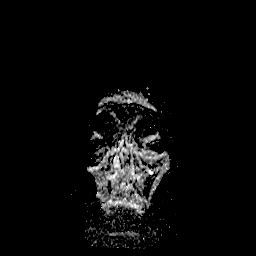
[im 11/33]
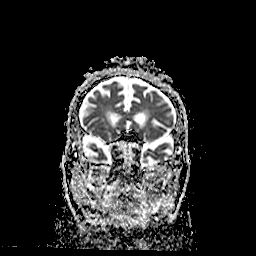
[im 22/33]
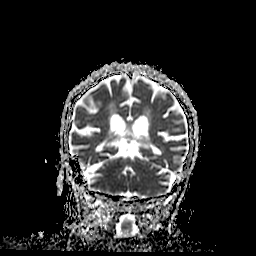
[im 33/33]
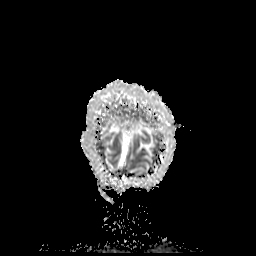

[36 of 48 positions shown; findings below may reference images not displayed]

FINDINGS: Brain: No focal diffusion restriction to indicate acute infarct. No
intraparenchymal hemorrhage. There is diffuse confluent hyperintense
T2-weighted signal within the periventricular and deep white matter,
most often seen in the setting of chronic microvascular ischemia. No
mass lesion or midline shift. No hydrocephalus or extra-axial fluid
collection. The midline structures are normal. No age advanced or
lobar predominant atrophy.

Vascular: Major intracranial arterial and venous sinus flow voids
are preserved. No evidence of chronic microhemorrhage or amyloid
angiopathy.

Skull and upper cervical spine: The visualized skull base,
calvarium, upper cervical spine and extracranial soft tissues are
normal.

Sinuses/Orbits: No fluid levels or advanced mucosal thickening. No
mastoid effusion. Normal orbits.
IMPRESSION: Severe chronic microvascular ischemia without acute intracranial
abnormality.
